# Patient Record
Sex: Male | Born: 1970 | Race: Black or African American | Hispanic: No | Marital: Single | Smoking: Former smoker
Health system: Southern US, Community
[De-identification: ages and names within clinical notes are randomized; demographics above are authoritative.]

## PROBLEM LIST (undated history)

## (undated) DIAGNOSIS — I2510 Atherosclerotic heart disease of native coronary artery without angina pectoris: Secondary | ICD-10-CM

## (undated) DIAGNOSIS — I509 Heart failure, unspecified: Secondary | ICD-10-CM

## (undated) DIAGNOSIS — I10 Essential (primary) hypertension: Secondary | ICD-10-CM

## (undated) DIAGNOSIS — J45909 Unspecified asthma, uncomplicated: Secondary | ICD-10-CM

## (undated) DIAGNOSIS — I639 Cerebral infarction, unspecified: Secondary | ICD-10-CM

## (undated) DIAGNOSIS — E785 Hyperlipidemia, unspecified: Secondary | ICD-10-CM

## (undated) DIAGNOSIS — N189 Chronic kidney disease, unspecified: Secondary | ICD-10-CM

## (undated) HISTORY — PX: CARDIAC VALVE REPLACEMENT: SHX585

## (undated) HISTORY — DX: Cerebral infarction, unspecified: I63.9

## (undated) HISTORY — DX: Heart failure, unspecified: I50.9

## (undated) HISTORY — DX: Atherosclerotic heart disease of native coronary artery without angina pectoris: I25.10

## (undated) HISTORY — DX: Unspecified asthma, uncomplicated: J45.909

## (undated) HISTORY — PX: APPENDECTOMY: SHX54

## (undated) HISTORY — PX: ANKLE ARTHODESIS W/ ARTHROSCOPY: SUR63

## (undated) HISTORY — DX: Chronic kidney disease, unspecified: N18.9

---

## 2017-07-31 HISTORY — PX: CORONARY ARTERY BYPASS GRAFT: SHX141

## 2020-12-28 ENCOUNTER — Encounter (HOSPITAL_COMMUNITY): Payer: Self-pay | Admitting: Emergency Medicine

## 2020-12-28 ENCOUNTER — Ambulatory Visit (HOSPITAL_COMMUNITY)
Admission: EM | Admit: 2020-12-28 | Discharge: 2020-12-28 | Disposition: A | Discharge status: Home / Self Care | Payer: Medicaid - Out of State

## 2020-12-28 ENCOUNTER — Ambulatory Visit (INDEPENDENT_AMBULATORY_CARE_PROVIDER_SITE_OTHER): Payer: Self-pay

## 2020-12-28 ENCOUNTER — Other Ambulatory Visit: Payer: Self-pay

## 2020-12-28 DIAGNOSIS — R0989 Other specified symptoms and signs involving the circulatory and respiratory systems: Secondary | ICD-10-CM

## 2020-12-28 DIAGNOSIS — E785 Hyperlipidemia, unspecified: Secondary | ICD-10-CM | POA: Diagnosis not present

## 2020-12-28 DIAGNOSIS — Z8679 Personal history of other diseases of the circulatory system: Secondary | ICD-10-CM

## 2020-12-28 DIAGNOSIS — I10 Essential (primary) hypertension: Secondary | ICD-10-CM | POA: Diagnosis not present

## 2020-12-28 DIAGNOSIS — R198 Other specified symptoms and signs involving the digestive system and abdomen: Secondary | ICD-10-CM | POA: Diagnosis not present

## 2020-12-28 DIAGNOSIS — Z760 Encounter for issue of repeat prescription: Secondary | ICD-10-CM | POA: Diagnosis not present

## 2020-12-28 HISTORY — DX: Essential (primary) hypertension: I10

## 2020-12-28 HISTORY — DX: Hyperlipidemia, unspecified: E78.5

## 2020-12-28 LAB — COMPREHENSIVE METABOLIC PANEL
ALT: 69 U/L — ABNORMAL HIGH (ref 0–44)
AST: 36 U/L (ref 15–41)
Albumin: 4.3 g/dL (ref 3.5–5.0)
Alkaline Phosphatase: 65 U/L (ref 38–126)
Anion gap: 11 (ref 5–15)
BUN: 23 mg/dL — ABNORMAL HIGH (ref 6–20)
CO2: 31 mmol/L (ref 22–32)
Calcium: 9.7 mg/dL (ref 8.9–10.3)
Chloride: 97 mmol/L — ABNORMAL LOW (ref 98–111)
Creatinine, Ser: 2.43 mg/dL — ABNORMAL HIGH (ref 0.61–1.24)
GFR, Estimated: 32 mL/min — ABNORMAL LOW (ref >60)
Glucose, Bld: 112 mg/dL — ABNORMAL HIGH (ref 70–99)
Potassium: 3.3 mmol/L — ABNORMAL LOW (ref 3.5–5.1)
Sodium: 139 mmol/L (ref 135–145)
Total Bilirubin: 1.1 mg/dL (ref 0.3–1.2)
Total Protein: 7.8 g/dL (ref 6.5–8.1)

## 2020-12-28 LAB — CBC WITH DIFFERENTIAL/PLATELET
Abs Immature Granulocytes: 0.01 10*3/uL (ref 0.00–0.07)
Basophils Absolute: 0.1 10*3/uL (ref 0.0–0.1)
Basophils Relative: 1 %
Eosinophils Absolute: 0.3 10*3/uL (ref 0.0–0.5)
Eosinophils Relative: 5 %
HCT: 43.6 % (ref 39.0–52.0)
Hemoglobin: 14.8 g/dL (ref 13.0–17.0)
Immature Granulocytes: 0 %
Lymphocytes Relative: 42 %
Lymphs Abs: 2.6 10*3/uL (ref 0.7–4.0)
MCH: 32.7 pg (ref 26.0–34.0)
MCHC: 33.9 g/dL (ref 30.0–36.0)
MCV: 96.2 fL (ref 80.0–100.0)
Monocytes Absolute: 0.4 10*3/uL (ref 0.1–1.0)
Monocytes Relative: 7 %
Neutro Abs: 2.8 10*3/uL (ref 1.7–7.7)
Neutrophils Relative %: 45 %
Platelets: 258 10*3/uL (ref 150–400)
RBC: 4.53 MIL/uL (ref 4.22–5.81)
RDW: 11.9 % (ref 11.5–15.5)
WBC: 6.1 10*3/uL (ref 4.0–10.5)
nRBC: 0 % (ref 0.0–0.2)

## 2020-12-28 LAB — TSH: TSH: 1.427 u[IU]/mL (ref 0.350–4.500)

## 2020-12-28 MED ORDER — ISOSORBIDE MONONITRATE ER 30 MG PO TB24: 30.0000 mg | ORAL_TABLET | Freq: Every day | ORAL | 0 refills | Status: DC

## 2020-12-28 MED ORDER — AMLODIPINE BESYLATE 5 MG PO TABS: 1.0000 | ORAL_TABLET | Freq: Every day | ORAL | 0 refills | Status: DC

## 2020-12-28 MED ORDER — LORATADINE 10 MG PO TABS: 10.0000 mg | ORAL_TABLET | Freq: Every day | ORAL | 0 refills | Status: DC

## 2020-12-28 MED ORDER — ALLOPURINOL 100 MG PO TABS: 100.0000 mg | ORAL_TABLET | Freq: Every day | ORAL | 0 refills | Status: DC

## 2020-12-28 MED ORDER — ASPIRIN 81 MG PO CHEW: 81.0000 mg | CHEWABLE_TABLET | Freq: Every day | ORAL | 0 refills | Status: DC

## 2020-12-28 MED ORDER — ATORVASTATIN CALCIUM 40 MG PO TABS: 1.0000 | ORAL_TABLET | Freq: Every day | ORAL | 0 refills | Status: DC

## 2020-12-28 MED ORDER — ENTRESTO 49-51 MG PO TABS: 1.0000 | ORAL_TABLET | Freq: Two times a day (BID) | ORAL | 0 refills | Status: DC

## 2020-12-28 MED ORDER — FUROSEMIDE 40 MG PO TABS: 40.0000 mg | ORAL_TABLET | Freq: Every day | ORAL | 0 refills | Status: DC

## 2020-12-28 MED ORDER — PREDNISONE 20 MG PO TABS: 20.0000 mg | ORAL_TABLET | Freq: Every day | ORAL | 0 refills | Status: AC

## 2020-12-28 NOTE — Discharge Instructions (Addendum)
I have called in refills of your medications.  Please take them as previously prescribed.  Someone should contact you to schedule a PCP appointment.  It is important that you follow-up with them as soon as possible for ongoing care.  There was nothing seen on your x-ray that than a little bit of soft tissue swelling.  I have called in prednisone a should not take NSAIDs including aspirin, ibuprofen/Advil, naproxen/Aleve due to risk of GI bleeding.  If this is not improved please follow-up with ENT as we discussed.

## 2020-12-28 NOTE — ED Provider Notes (Signed)
[facility]    CSN: [id number] Arrival date & time: 12/28/20  1405      History   Chief Complaint Chief Complaint  Patient presents with  . Medication Refill  . Sore Throat    HPI John Price is a 50 y.o. male.   Patient released and moved from [place] to [place].  He has a past medical history of valve replacement in 2019 and associated heart failure.  He is well controlled on his current medication regimen but is running out of his medicine and requesting refills today.  He does not have a primary care provider in this area and is interested in establishing.  He does not weigh himself daily but does monitor salt intake.  He is prescribed Lasix and takes a steady dose of this not based on daily weights.  He denies any chest pain, shortness of breath, nausea, vomiting, shortness of breath.  He reports he is doing well on these medications but is requiring refill and help establishing with primary care provider in the area.  In addition, patient reports a 1 day history of globus sensation.  Denies any unusual food intake or choking episodes.  Denies history of goiter or thyroid condition.  He denies any specific sore throat but just feels as though there is something caught in his throat.  He has been able to eat and drink normally and is taking pills without difficulty.  He has not seen ENT in the past.  Denies any shortness of breath or throat/mouth swelling.      Past Medical History:  Diagnosis Date  . Hyperlipidemia   . Hypertension     Patient Active Problem List   Diagnosis Date Noted  . History of heart failure 12/28/2020  . Essential hypertension 12/28/2020  . Hyperlipidemia 12/28/2020    Past Surgical History:  Procedure Laterality Date  . CARDIAC VALVE REPLACEMENT         Home Medications    Prior to Admission medications   Medication Sig Start Date End Date Taking? Authorizing Provider  predniSONE (DELTASONE) 20 MG tablet Take 1  tablet (20 mg total) by mouth daily with breakfast for 4 days. 12/28/20 01/01/21 Yes Mouhamad Teed, Noberto Retort, PA-C  allopurinol (ZYLOPRIM) 100 MG tablet Take 1 tablet (100 mg total) by mouth daily. 12/28/20   Shaniqua Guillot K, PA-C  amLODipine (NORVASC) 5 MG tablet Take 1 tablet (5 mg total) by mouth daily. 12/28/20   Tedd Cottrill, Noberto Retort, PA-C  aspirin 81 MG chewable tablet Chew 1 tablet (81 mg total) by mouth daily. 12/28/20   Senora Lacson, Noberto Retort, PA-C  atorvastatin (LIPITOR) 40 MG tablet Take 1 tablet (40 mg total) by mouth daily. 12/28/20   Sheng Pritz, Noberto Retort, PA-C  cloNIDine (CATAPRES) 0.2 MG tablet Take 0.2 mg by mouth 2 (two) times daily. 11/19/20   [provider]  ENTRESTO 49-51 MG Take 1 tablet by mouth 2 (two) times daily. 12/28/20   Kay Shippy, Noberto Retort, PA-C  furosemide (LASIX) 40 MG tablet Take 1 tablet (40 mg total) by mouth daily. 12/28/20   Marcena Dias, Noberto Retort, PA-C  isosorbide mononitrate (IMDUR) 30 MG 24 hr tablet Take 1 tablet (30 mg total) by mouth daily. 12/28/20   Denetra Formoso, Noberto Retort, PA-C  loratadine (CLARITIN) 10 MG tablet Take 1 tablet (10 mg total) by mouth daily. 12/28/20   Shmuel Girgis, Noberto Retort, PA-C  meclizine (ANTIVERT) 12.5 MG tablet Take 12.5 mg by mouth daily as needed. 08/05/20   [provider]  potassium chloride SA (KLOR-CON) 20 MEQ tablet Take 20 mEq by mouth daily. 11/06/20   [provider]    Family History History reviewed. No pertinent family history.  Social History Social History   Tobacco Use  . Smoking status: Never Smoker  . Smokeless tobacco: Never Used  Substance Use Topics  . Alcohol use: Yes  . Drug use: Never     Allergies   Gabapentin   Review of Systems Review of Systems  Constitutional: Negative for activity change, appetite change, fatigue and fever.  HENT: Positive for sore throat and trouble swallowing. Negative for congestion, sinus pressure and sneezing.   Respiratory: Negative for cough and shortness of breath.   Cardiovascular: Negative for chest pain,  palpitations and leg swelling.  Gastrointestinal: Negative for abdominal pain, diarrhea, nausea and vomiting.  Musculoskeletal: Negative for arthralgias and myalgias.  Neurological: Negative for dizziness, light-headedness and headaches.     Physical Exam Triage Vital Signs ED Triage Vitals  Enc Vitals Group     BP 12/28/20 1613 (!) 142/83     Pulse Rate 12/28/20 1613 (!) 54     Resp 12/28/20 1613 17     Temp 12/28/20 1613 98.7 F (37.1 C)     Temp Source 12/28/20 1613 Oral     SpO2 12/28/20 1613 98 %     Weight --      Height --      Head Circumference --      Peak Flow --      Pain Score 12/28/20 1603 0     Pain Loc --      Pain Edu? --      Excl. in GC? --    No data found.  Updated Vital Signs BP (!) 142/83 (BP Location: Right Arm)   Pulse (!) 54   Temp 98.7 F (37.1 C) (Oral)   Resp 17   SpO2 98%   Visual Acuity Right Eye Distance:   Left Eye Distance:   Bilateral Distance:    Right Eye Near:   Left Eye Near:    Bilateral Near:     Physical Exam Vitals reviewed.  Constitutional:      General: He is awake.     Appearance: Normal appearance. He is normal weight. He is not ill-appearing.     Comments: Very pleasant male appears stated age in no acute distress  HENT:     Head: Normocephalic and atraumatic.     Right Ear: Tympanic membrane, ear canal and external ear normal. Tympanic membrane is not erythematous or bulging.     Left Ear: Tympanic membrane, ear canal and external ear normal. Tympanic membrane is not erythematous or bulging.     Nose: Nose normal.     Mouth/Throat:     Pharynx: Uvula midline. No oropharyngeal exudate or posterior oropharyngeal erythema.  Neck:     Thyroid: No thyroid mass, thyromegaly or thyroid tenderness.  Cardiovascular:     Rate and Rhythm: Normal rate and regular rhythm.     Heart sounds: Normal heart sounds. No murmur heard.   Pulmonary:     Effort: Pulmonary effort is normal. No accessory muscle usage or  respiratory distress.     Breath sounds: Normal breath sounds. No stridor. No wheezing, rhonchi or rales.     Comments: Clear to auscultation bilaterally Abdominal:     General: Bowel sounds are normal.     Palpations: Abdomen is soft.     Tenderness: There is no  abdominal tenderness.  Musculoskeletal:     Cervical back: Normal range of motion and neck supple.  Lymphadenopathy:     Head:     Right side of head: No submental, submandibular or tonsillar adenopathy.     Left side of head: No submental, submandibular or tonsillar adenopathy.     Cervical: No cervical adenopathy.  Neurological:     Mental Status: He is alert.  Psychiatric:        Behavior: Behavior is cooperative.      UC Treatments / Results  Labs (all labs ordered are listed, but only abnormal results are displayed) Labs Reviewed  CBC WITH DIFFERENTIAL/PLATELET  COMPREHENSIVE METABOLIC PANEL  TSH    EKG   Radiology DG Neck Soft Tissue  Result Date: 12/28/2020 CLINICAL DATA:  Globus sensation EXAM: NECK SOFT TISSUES - 1+ VIEW COMPARISON:  None. FINDINGS: Posterior oropharynx appears collapsed and is non air-filled. Trachea is midline and normally aerated. Diffuse prevertebral soft tissue swelling is present throughout the cervical spine which may be due to expiration and diffuse fat deposition. There is calcification of the carotid artery bilaterally. Normal vertebral alignment. Mild disc degeneration and anterior spurring C3 through C7. Sternal wires for median sternotomy. IMPRESSION: Posterior oropharynx is non air-filled likely due to collapsed pharynx. Possible expiratory study. Trachea is normally aerated. Limited evaluation of the epiglottis. Diffuse prevertebral soft tissue swelling is nonfocal and may be due to body habitus Electronically Signed   By: Marlan Palau M.D.   On: 12/28/2020 18:19    Procedures Procedures (including critical care time)  Medications Ordered in UC Medications - No data to  display  Initial Impression / Assessment and Plan / UC Course  I have reviewed the triage vital signs and the nursing notes.  Pertinent labs & imaging results that were available during my care of the patient were reviewed by me and considered in my medical decision making (see chart for details).     Refills provided of medication as requested patient.  Encouraged him to continue with daily weights and avoid sodium.  Discussed the importance of establishing with PCP.  Patient does not have a PCP in this area and so we will try to establish him with PCP assistance.  CBC and CMP obtained today-results pending.  Discussed alarm symptoms are warrant emergent evaluation.  Strict return precautions given to patient expressed understanding.  X-ray obtained of soft tissue stated possible edema otherwise related to body habitus.  He is EKG today-results pending.  We will start low-dose prednisone to help manage inflammation.  Patient was given contact information for ENT with instruction to call them should symptoms persist.  Discussed alarm symptoms that warrant emergent evaluation.  Strict return precautions given to which patient expressed understanding   Final Clinical Impressions(s) / UC Diagnoses   Final diagnoses:  History of heart failure  Essential hypertension  Hyperlipidemia, unspecified hyperlipidemia type  Encounter for medication refill  Globus sensation     Discharge Instructions     I have called in refills of your medications.  Please take them as previously prescribed.  Someone should contact you to schedule a PCP appointment.  It is important that you follow-up with them as soon as possible for ongoing care.    ED Prescriptions    Medication Sig Dispense Auth. Provider   allopurinol (ZYLOPRIM) 100 MG tablet Take 1 tablet (100 mg total) by mouth daily. 30 tablet Kaydin Karbowski K, PA-C   amLODipine (NORVASC) 5 MG tablet Take 1  tablet (5 mg total) by mouth daily. 30 tablet  Harwood Nall K, PA-C   atorvastatin (LIPITOR) 40 MG tablet Take 1 tablet (40 mg total) by mouth daily. 30 tablet Nakyah Erdmann K, PA-C   ENTRESTO 49-51 MG Take 1 tablet by mouth 2 (two) times daily. 60 tablet Ranveer Wahlstrom K, PA-C   aspirin 81 MG chewable tablet Chew 1 tablet (81 mg total) by mouth daily. 30 tablet Rawley Harju K, PA-C   furosemide (LASIX) 40 MG tablet Take 1 tablet (40 mg total) by mouth daily. 30 tablet Jomarion Mish K, PA-C   isosorbide mononitrate (IMDUR) 30 MG 24 hr tablet Take 1 tablet (30 mg total) by mouth daily. 30 tablet Dee Paden K, PA-C   loratadine (CLARITIN) 10 MG tablet Take 1 tablet (10 mg total) by mouth daily. 30 tablet Eric Nees K, PA-C   predniSONE (DELTASONE) 20 MG tablet Take 1 tablet (20 mg total) by mouth daily with breakfast for 4 days. 4 tablet Tania Steinhauser, Noberto Retort, PA-C     PDMP not reviewed this encounter.   Jeani Hawking, PA-C 12/28/20 1836

## 2020-12-28 NOTE — ED Triage Notes (Signed)
Pt presents with feeling of "something stuck in throat" that started this pm and medication refill of all medications listed in chart. States [place] and does not have pcp.

## 2021-01-02 ENCOUNTER — Encounter: Payer: Self-pay | Admitting: *Deleted

## 2021-01-03 ENCOUNTER — Other Ambulatory Visit: Payer: Self-pay

## 2021-01-03 ENCOUNTER — Emergency Department (HOSPITAL_COMMUNITY)
Admission: EM | Admit: 2021-01-03 | Discharge: 2021-01-04 | Disposition: A | Discharge status: Home / Self Care | Payer: Medicaid Other | Attending: Emergency Medicine | Admitting: Emergency Medicine

## 2021-01-03 ENCOUNTER — Telehealth (HOSPITAL_COMMUNITY): Payer: Self-pay | Admitting: Emergency Medicine

## 2021-01-03 ENCOUNTER — Encounter (HOSPITAL_COMMUNITY): Payer: Self-pay | Admitting: Emergency Medicine

## 2021-01-03 DIAGNOSIS — R42 Dizziness and giddiness: Secondary | ICD-10-CM | POA: Diagnosis not present

## 2021-01-03 DIAGNOSIS — Z79899 Other long term (current) drug therapy: Secondary | ICD-10-CM | POA: Insufficient documentation

## 2021-01-03 DIAGNOSIS — R010 Benign and innocent cardiac murmurs: Secondary | ICD-10-CM | POA: Diagnosis not present

## 2021-01-03 DIAGNOSIS — I110 Hypertensive heart disease with heart failure: Secondary | ICD-10-CM | POA: Insufficient documentation

## 2021-01-03 DIAGNOSIS — Z7982 Long term (current) use of aspirin: Secondary | ICD-10-CM | POA: Diagnosis not present

## 2021-01-03 DIAGNOSIS — F419 Anxiety disorder, unspecified: Secondary | ICD-10-CM | POA: Diagnosis not present

## 2021-01-03 DIAGNOSIS — I509 Heart failure, unspecified: Secondary | ICD-10-CM | POA: Diagnosis not present

## 2021-01-03 MED ORDER — ALLOPURINOL 100 MG PO TABS: 100.0000 mg | ORAL_TABLET | Freq: Every day | ORAL | 0 refills | Status: DC

## 2021-01-03 MED ORDER — ENTRESTO 49-51 MG PO TABS: 1.0000 | ORAL_TABLET | Freq: Two times a day (BID) | ORAL | 0 refills | Status: DC

## 2021-01-03 MED ORDER — ATORVASTATIN CALCIUM 40 MG PO TABS: 1.0000 | ORAL_TABLET | Freq: Every day | ORAL | 0 refills | Status: DC

## 2021-01-03 MED ORDER — LORATADINE 10 MG PO TABS: 10.0000 mg | ORAL_TABLET | Freq: Every day | ORAL | 0 refills | Status: DC

## 2021-01-03 MED ORDER — ISOSORBIDE MONONITRATE ER 30 MG PO TB24: 30.0000 mg | ORAL_TABLET | Freq: Every day | ORAL | 0 refills | Status: DC

## 2021-01-03 MED ORDER — AMLODIPINE BESYLATE 5 MG PO TABS: 1.0000 | ORAL_TABLET | Freq: Every day | ORAL | 0 refills | Status: DC

## 2021-01-03 MED ORDER — ASPIRIN 81 MG PO CHEW: 81.0000 mg | CHEWABLE_TABLET | Freq: Every day | ORAL | 0 refills | Status: DC

## 2021-01-03 MED ORDER — FUROSEMIDE 40 MG PO TABS: 40.0000 mg | ORAL_TABLET | Freq: Every day | ORAL | 0 refills | Status: DC

## 2021-01-03 NOTE — ED Provider Notes (Signed)
Emergency Medicine Provider Triage Evaluation Note  John Price , a 50 y.o. male  was evaluated in triage.  Pt complains of dizziness.  The patient states that he has been feeling intermittently dizzy for the last few days.  He has a history of similar.  He states that this has previously happened when he has run out of his home medications.  He reports that he just moved to the area and does not have any of his home medications and use them all refilled.  He reports intermittent headaches.  No numbness, weakness, vomiting, leg swelling, chest pain, shortness of breath  Per chart review, the patient was seen at urgent care on May 31.  His home medications were called in to the pharmacy.  However, the patient reports that he became increasingly anxious about taking of his medications so he has not picked them up yet.  Review of Systems  Positive: Anxiety Negative: Numbness, weakness, vomiting, leg swelling, visual changes, chest pain, shortness of breath  Physical Exam  BP (!) 153/97 (BP Location: Left Arm)   Pulse 60   Temp 98.2 F (36.8 C) (Oral)   Resp (!) 22   Ht 5\' 8"  (1.727 m)   Wt 120 kg   SpO2 100%   BMI 40.22 kg/m  Gen:   Awake, no distress   Resp:  Normal effort  MSK:   Moves extremities without difficulty  Other:  Answers questions in complete, fluent sentences.  No peripheral edema.   Medical Decision Making  Medically screening exam initiated at 11:38 PM.  Appropriate orders placed.  was informed that the remainder of the evaluation will be completed by another provider, this initial triage assessment does not replace that evaluation, and the importance of remaining in the ED until their evaluation is complete.  50 year old male who presents with dizziness.  Has a history of similar and states that this is most likely related to being out of his home medications.  I did review his labs from urgent care on May 31.  He does not appear overtly volume  overloaded.  He will require further work-up and evaluation in the emergency department.   June 02, PA-C 01/03/21 2338    2339, MD 01/04/21 [phone]

## 2021-01-03 NOTE — ED Triage Notes (Signed)
Patient reports intermittent vertigo/dizzy this week , requesting medications/prescriptions for his vertigo .

## 2021-01-03 NOTE — Telephone Encounter (Signed)
Patient wanted resent to a different pharmacy that would accept his insurance and has all meds available

## 2021-01-04 LAB — CBC
HCT: 41.9 % (ref 39.0–52.0)
Hemoglobin: 14.5 g/dL (ref 13.0–17.0)
MCH: 33.3 pg (ref 26.0–34.0)
MCHC: 34.6 g/dL (ref 30.0–36.0)
MCV: 96.3 fL (ref 80.0–100.0)
Platelets: 274 10*3/uL (ref 150–400)
RBC: 4.35 MIL/uL (ref 4.22–5.81)
RDW: 11.9 % (ref 11.5–15.5)
WBC: 8 10*3/uL (ref 4.0–10.5)
nRBC: 0 % (ref 0.0–0.2)

## 2021-01-04 LAB — BASIC METABOLIC PANEL
Anion gap: 10 (ref 5–15)
BUN: 36 mg/dL — ABNORMAL HIGH (ref 6–20)
CO2: 31 mmol/L (ref 22–32)
Calcium: 9.5 mg/dL (ref 8.9–10.3)
Chloride: 97 mmol/L — ABNORMAL LOW (ref 98–111)
Creatinine, Ser: 2.48 mg/dL — ABNORMAL HIGH (ref 0.61–1.24)
GFR, Estimated: 31 mL/min — ABNORMAL LOW (ref >60)
Glucose, Bld: 120 mg/dL — ABNORMAL HIGH (ref 70–99)
Potassium: 3.2 mmol/L — ABNORMAL LOW (ref 3.5–5.1)
Sodium: 138 mmol/L (ref 135–145)

## 2021-01-04 MED ORDER — MECLIZINE HCL 12.5 MG PO TABS: 12.5000 mg | ORAL_TABLET | Freq: Three times a day (TID) | ORAL | 0 refills | Status: DC | PRN

## 2021-01-04 MED ORDER — MECLIZINE HCL 25 MG PO TABS
25.0000 mg | ORAL_TABLET | Freq: Once | ORAL | Status: AC
  Administered 2021-01-04: 25 mg via ORAL
  Filled 2021-01-04: qty 1

## 2021-01-04 NOTE — Discharge Instructions (Addendum)
You were seen today for dizziness and anxiety.  Make sure to pick up your prescriptions at the pharmacy.

## 2021-01-04 NOTE — ED Provider Notes (Signed)
[facility] EMERGENCY DEPARTMENT Provider Note   CSN: [id number] Arrival date & time: 01/03/21  2041     History Chief Complaint  Patient presents with  . Dizziness    Vertigo     John Price is a 50 y.o. male.  HPI     This a 50 year old male with a history of hypertension, hyperlipidemia, heart failure, reported stroke, chronic kidney disease who presents with dizziness and anxiety.  Patient reports that he just moved to [place] and now lives with his mother.  He does not have a primary physician.  He is interested in establishing care.  He states that he has been intermittently dizzy.  He has a history of vertigo for which he takes medications.  He is not having any headache.  He is currently not dizzy.  He has been unable to pick up his medications from the pharmacy.  He denies any new weakness, numbness, tingling, chest pain, shortness of breath, abdominal pain, fevers.  Patient reports he has anxiety of "what comes next."  Denies SI or HI.  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  Past Medical History:  Diagnosis Date  . Hyperlipidemia   . Hypertension     Patient Active Problem List   Diagnosis Date Noted  . History of heart failure 12/28/2020  . Essential hypertension 12/28/2020  . Hyperlipidemia 12/28/2020    Past Surgical History:  Procedure Laterality Date  . CARDIAC VALVE REPLACEMENT         No family history on file.  Social History   Tobacco Use  . Smoking status: Never Smoker  . Smokeless tobacco: Never Used  Substance Use Topics  . Alcohol use: Yes  . Drug use: Never    Home Medications Prior to Admission medications   Medication Sig Start Date End Date Taking? Authorizing Provider  meclizine (ANTIVERT) 12.5 MG tablet Take 1 tablet (12.5 mg total) by mouth 3 (three) times daily as needed for dizziness. 01/04/21  Yes Bell Cai, Mayer Masker, MD  allopurinol  (ZYLOPRIM) 100 MG tablet Take 1 tablet (100 mg total) by mouth daily. 01/03/21   Merrilee Jansky, MD  amLODipine (NORVASC) 5 MG tablet Take 1 tablet (5 mg total) by mouth daily. 01/03/21   Merrilee Jansky, MD  aspirin 81 MG chewable tablet Chew 1 tablet (81 mg total) by mouth daily. 01/03/21   Merrilee Jansky, MD  atorvastatin (LIPITOR) 40 MG tablet Take 1 tablet (40 mg total) by mouth daily. 01/03/21   LampteyBritta Mccreedy, MD  cloNIDine (CATAPRES) 0.2 MG tablet Take 0.2 mg by mouth 2 (two) times daily. 11/19/20   [provider]  ENTRESTO 49-51 MG Take 1 tablet by mouth 2 (two) times daily. 01/03/21   LampteyBritta Mccreedy, MD  furosemide (LASIX) 40 MG tablet Take 1 tablet (40 mg total) by mouth daily. 01/03/21   Merrilee Jansky, MD  isosorbide mononitrate (IMDUR) 30 MG 24 hr tablet Take 1 tablet (30 mg total) by mouth daily. 01/03/21   Merrilee Jansky, MD  loratadine (CLARITIN) 10 MG tablet Take 1 tablet (10 mg total) by mouth daily. 01/03/21   Merrilee Jansky, MD  meclizine (ANTIVERT) 12.5 MG tablet Take 12.5 mg by mouth daily as needed. 08/05/20   [provider]  potassium chloride SA (KLOR-CON) 20 MEQ tablet Take 20 mEq by mouth daily. 11/06/20   [provider]  Allergies    Gabapentin  Review of Systems   Review of Systems  Constitutional: Negative for fever.  Respiratory: Negative for shortness of breath.   Cardiovascular: Negative for chest pain.  Gastrointestinal: Negative for abdominal pain, nausea and vomiting.  Musculoskeletal: Negative for back pain.  Neurological: Positive for dizziness. Negative for light-headedness and headaches.  Psychiatric/Behavioral: The patient is nervous/anxious.   All other systems reviewed and are negative.   Physical Exam Updated Vital Signs BP 134/79   Pulse (!) 50   Temp 97.6 F (36.4 C) (Oral)   Resp (!) 21   Ht 1.727 m (5\' 8" )   Wt 120 kg   SpO2 96%   BMI 40.22 kg/m   Physical Exam Vitals and nursing note  reviewed.  Constitutional:      Appearance: He is well-developed. He is obese.  HENT:     Head: Normocephalic and atraumatic.     Nose: Nose normal.     Mouth/Throat:     Mouth: Mucous membranes are moist.  Eyes:     Extraocular Movements: Extraocular movements intact.     Pupils: Pupils are equal, round, and reactive to light.     Comments: No nystagmus  Cardiovascular:     Rate and Rhythm: Normal rate and regular rhythm.     Heart sounds: Murmur heard.    Pulmonary:     Effort: Pulmonary effort is normal. No respiratory distress.     Breath sounds: Normal breath sounds. No wheezing.  Abdominal:     General: Bowel sounds are normal.     Palpations: Abdomen is soft.     Tenderness: There is no abdominal tenderness. There is no rebound.  Musculoskeletal:     Cervical back: Neck supple.     Right lower leg: No edema.     Left lower leg: No edema.  Lymphadenopathy:     Cervical: No cervical adenopathy.  Skin:    General: Skin is warm and dry.  Neurological:     Mental Status: He is alert and oriented to person, place, and time.     Comments: Cranial nerves II through XII intact, 5 out of 5 strength in all 4 extremities, no dysmetria to finger-nose-finger  Psychiatric:        Mood and Affect: Mood normal.     ED Results / Procedures / Treatments   Labs (all labs ordered are listed, but only abnormal results are displayed) Labs Reviewed  BASIC METABOLIC PANEL - Abnormal; Notable for the following components:      Result Value   Potassium 3.2 (*)    Chloride 97 (*)    Glucose, Bld 120 (*)    BUN 36 (*)    Creatinine, Ser 2.48 (*)    GFR, Estimated 31 (*)    All other components within normal limits  CBC    EKG None  Radiology No results found.  Procedures Procedures   Medications Ordered in ED Medications  meclizine (ANTIVERT) tablet 25 mg (25 mg Oral Given 01/04/21 0511)    ED Course  I have reviewed the triage vital signs and the nursing  notes.  Pertinent labs & imaging results that were available during my care of the patient were reviewed by me and considered in my medical decision making (see chart for details).    MDM Rules/Calculators/A&P                          Patient presents with  complaints of intermittent dizziness.  He is not currently dizzy.  He has no focal signs or symptoms.  No evidence of dysmetria.  History of vertigo responsive to meclizine in the past.  He was given a dose of meclizine.  Doubt central vertigo.  Labs reviewed from triage.  No leukocytosis.  Creatinine 2.48 which appears to be close to baseline.  Patient also endorses anxiety around his recent move and not having follow-up with a primary physician.  He also has not yet picked up his additional medications.  Patient was given a dose of meclizine.  Do not feel he needs imaging or further work-up at this time.  Was given follow-up information for [facility].  After history, exam, and medical workup I feel the patient has been appropriately medically screened and is safe for discharge home. Pertinent diagnoses were discussed with the patient. Patient was given return precautions.  Final Clinical Impression(s) / ED Diagnoses Final diagnoses:  Vertigo  Anxiety    Rx / DC Orders ED Discharge Orders         Ordered    meclizine (ANTIVERT) 12.5 MG tablet  3 times daily PRN        01/04/21 0554           Shon Baton, MD 01/04/21 [phone]

## 2021-02-03 ENCOUNTER — Encounter (HOSPITAL_COMMUNITY): Payer: Self-pay | Admitting: Emergency Medicine

## 2021-02-03 ENCOUNTER — Emergency Department (HOSPITAL_COMMUNITY)
Admission: EM | Admit: 2021-02-03 | Discharge: 2021-02-03 | Disposition: A | Discharge status: Home / Self Care | Payer: Medicaid Other | Attending: Emergency Medicine | Admitting: Emergency Medicine

## 2021-02-03 DIAGNOSIS — I110 Hypertensive heart disease with heart failure: Secondary | ICD-10-CM | POA: Diagnosis not present

## 2021-02-03 DIAGNOSIS — Z79899 Other long term (current) drug therapy: Secondary | ICD-10-CM | POA: Insufficient documentation

## 2021-02-03 DIAGNOSIS — I509 Heart failure, unspecified: Secondary | ICD-10-CM | POA: Diagnosis not present

## 2021-02-03 DIAGNOSIS — Z7982 Long term (current) use of aspirin: Secondary | ICD-10-CM | POA: Insufficient documentation

## 2021-02-03 DIAGNOSIS — Z760 Encounter for issue of repeat prescription: Secondary | ICD-10-CM

## 2021-02-03 MED ORDER — CLONIDINE HCL 0.2 MG PO TABS: 0.2000 mg | ORAL_TABLET | Freq: Two times a day (BID) | ORAL | 0 refills | Status: DC

## 2021-02-03 MED ORDER — ALLOPURINOL 100 MG PO TABS: 100.0000 mg | ORAL_TABLET | Freq: Every day | ORAL | 0 refills | Status: DC

## 2021-02-03 MED ORDER — AMLODIPINE BESYLATE 5 MG PO TABS: 5.0000 mg | ORAL_TABLET | Freq: Every day | ORAL | 0 refills | Status: DC

## 2021-02-03 MED ORDER — FUROSEMIDE 40 MG PO TABS: 40.0000 mg | ORAL_TABLET | Freq: Every day | ORAL | 0 refills | Status: DC

## 2021-02-03 MED ORDER — POTASSIUM CHLORIDE CRYS ER 20 MEQ PO TBCR: 20.0000 meq | EXTENDED_RELEASE_TABLET | Freq: Every day | ORAL | 0 refills | Status: DC

## 2021-02-03 MED ORDER — ATORVASTATIN CALCIUM 40 MG PO TABS: 40.0000 mg | ORAL_TABLET | Freq: Every day | ORAL | 0 refills | Status: DC

## 2021-02-03 MED ORDER — ENTRESTO 49-51 MG PO TABS: 1.0000 | ORAL_TABLET | Freq: Two times a day (BID) | ORAL | 0 refills | Status: DC

## 2021-02-03 MED ORDER — MECLIZINE HCL 12.5 MG PO TABS: 12.5000 mg | ORAL_TABLET | Freq: Every day | ORAL | 0 refills | Status: DC | PRN

## 2021-02-03 MED ORDER — LORATADINE 10 MG PO TABS: 10.0000 mg | ORAL_TABLET | Freq: Every day | ORAL | 0 refills | Status: DC

## 2021-02-03 MED ORDER — CARVEDILOL 25 MG PO TABS: 25.0000 mg | ORAL_TABLET | Freq: Two times a day (BID) | ORAL | 0 refills | Status: DC

## 2021-02-03 NOTE — ED Provider Notes (Signed)
[facility] EMERGENCY DEPARTMENT Provider Note   CSN: [id number] Arrival date & time: 02/03/21  1041     History Chief Complaint  Patient presents with   Medication Refill    John Price is a 50 y.o. male.  HPI   Every patient is a 50 year old male new to the area requesting medication refills.  No acute complaints.  Past Medical History:  Diagnosis Date   Hyperlipidemia    Hypertension     Patient Active Problem List   Diagnosis Date Noted   History of heart failure 12/28/2020   Essential hypertension 12/28/2020   Hyperlipidemia 12/28/2020    Past Surgical History:  Procedure Laterality Date   CARDIAC VALVE REPLACEMENT         No family history on file.  Social History   Tobacco Use   Smoking status: Never   Smokeless tobacco: Never  Substance Use Topics   Alcohol use: Yes   Drug use: Never    Home Medications Prior to Admission medications   Medication Sig Start Date End Date Taking? Authorizing Provider  allopurinol (ZYLOPRIM) 100 MG tablet Take 1 tablet (100 mg total) by mouth daily. 01/03/21   Merrilee Jansky, MD  amLODipine (NORVASC) 5 MG tablet Take 1 tablet (5 mg total) by mouth daily. 01/03/21   Merrilee Jansky, MD  aspirin 81 MG chewable tablet Chew 1 tablet (81 mg total) by mouth daily. 01/03/21   Merrilee Jansky, MD  atorvastatin (LIPITOR) 40 MG tablet Take 1 tablet (40 mg total) by mouth daily. 01/03/21   LampteyBritta Mccreedy, MD  cloNIDine (CATAPRES) 0.2 MG tablet Take 0.2 mg by mouth 2 (two) times daily. 11/19/20   [provider]  ENTRESTO 49-51 MG Take 1 tablet by mouth 2 (two) times daily. 01/03/21   LampteyBritta Mccreedy, MD  furosemide (LASIX) 40 MG tablet Take 1 tablet (40 mg total) by mouth daily. 01/03/21   Merrilee Jansky, MD  isosorbide mononitrate (IMDUR) 30 MG 24 hr tablet Take 1 tablet (30 mg total) by mouth daily. 01/03/21   Merrilee Jansky, MD  loratadine (CLARITIN) 10 MG tablet Take 1 tablet (10 mg  total) by mouth daily. 01/03/21   Merrilee Jansky, MD  meclizine (ANTIVERT) 12.5 MG tablet Take 12.5 mg by mouth daily as needed. 08/05/20   [provider]  meclizine (ANTIVERT) 12.5 MG tablet Take 1 tablet (12.5 mg total) by mouth 3 (three) times daily as needed for dizziness. 01/04/21   Horton, Mayer Masker, MD  potassium chloride SA (KLOR-CON) 20 MEQ tablet Take 20 mEq by mouth daily. 11/06/20   [provider]    Allergies    Gabapentin  Review of Systems   Review of Systems  All other systems reviewed and are negative.  Physical Exam Updated Vital Signs BP (!) 146/81   Pulse 85   Temp 98.2 F (36.8 C) (Oral)   Resp 16   SpO2 97%   Physical Exam Vitals and nursing note reviewed. Exam conducted with a chaperone present.  Constitutional:      General: He is not in acute distress.    Appearance: Normal appearance.  HENT:     Head: Normocephalic and atraumatic.  Eyes:     General: No scleral icterus.    Extraocular Movements: Extraocular movements intact.     Pupils: Pupils are equal, round, and reactive to light.  Skin:    Coloration: Skin is not jaundiced.  Neurological:  Mental Status: He is alert. Mental status is at baseline.     Coordination: Coordination normal.    ED Results / Procedures / Treatments   Labs (all labs ordered are listed, but only abnormal results are displayed) Labs Reviewed - No data to display  EKG None  Radiology No results found.  Procedures Procedures   Medications Ordered in ED Medications - No data to display  ED Course  I have reviewed the triage vital signs and the nursing notes.  Pertinent labs & imaging results that were available during my care of the patient were reviewed by me and considered in my medical decision making (see chart for details).    MDM Rules/Calculators/A&P                          No acute complaints.  Vitals are stable although he is moderately hypertensive.  Given him a referral  to establish with a PCP here.  States she already has a PCP, but does not have an appointment yet.  I given him 1 month refills of home meds.  Patient is appropriate for discharge.  Final Clinical Impression(s) / ED Diagnoses Final diagnoses:  None    Rx / DC Orders ED Discharge Orders     None        Theron Arista, [place] 02/03/21 1147    Mancel Bale, MD 02/05/21 [id number]

## 2021-02-03 NOTE — ED Triage Notes (Signed)
Patient here requesting refill of several medications. States he recently moved to the area and has not seen his new primary care provider yet. Patient alert, oriented, and in no apparent distress at this time.

## 2021-02-03 NOTE — Discharge Instructions (Signed)
You were seen today for medication refill.  Please establish with a PCP in the area.

## 2021-02-25 NOTE — Progress Notes (Signed)
Date:  02/28/2021   ID:  JACORIE ERNSBERGER, DOB Mar 20, 1971, MRN [id number]  PCP:  Carylon Perches, NP  Cardiologist:  Rex Kras, DO, [facility]  (established care 02/28/2021) Former Cardiology Providers: Lynne Leader [facility])  REASON FOR CONSULT: Complex past cardiac history (CABG and mitral valve replacement) reestablish care.  REQUESTING PHYSICIAN:  Self-referral.  Chief Complaint  Patient presents with   New Patient (Initial Visit)    HPI  JATINDER MCDONAGH is a 50 y.o. 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  He is referred to the office at the request of No ref. provider found for evaluation of reestablish care and complex cardiac history.  Patient states that he recently relocated from [place] to [facility] and is referred by his insurance company to establish care with a cardiologist locally.  He was under the care of Dr. Lynne Leader at [place] last office note dated 11/05/2020.  He comes in the office today to have his medications refilled as he is nearly running out.  He recently went to the ED approximately 1 month ago for the same reason.  Clinically patient denies any chest pain at rest or with effort related activities.  He does have mild discomfort along the sternotomy site which is chronic and stable.  Patient is overall euvolemic and not in congestive heart failure but does carry history of HFrEF.  Patient states that he has chronic shortness of breath  secondary to underlying asthma.  Based on the very limited amount of records received from his prior cardiologist office it appears he had hemorrhagic stroke in 2018 with left-sided residual deficits due to uncontrolled hypertension.  The following year in 2019 he underwent coronary artery bypass grafting surgery with mitral valve replacement.  Patient does not recall how many vessels were bypassed or his coronary anatomy.  And based on the records obtained he had a bioprosthetic mitral valve.  FUNCTIONAL STATUS: No structured exercise program or daily routine.   ALLERGIES: Allergies  Allergen Reactions   Gabapentin Anaphylaxis    MEDICATION LIST PRIOR TO VISIT: Current Meds  Medication Sig   albuterol (PROVENTIL HFA) 108 (90 Base) MCG/ACT inhaler Inhale into the lungs every 6 (six) hours as needed for wheezing or shortness of breath.   allopurinol (ZYLOPRIM) 100 MG tablet Take 1 tablet (100 mg total) by mouth daily.   ipratropium (ATROVENT HFA) 17 MCG/ACT inhaler Inhale 2 puffs into the lungs every 6 (six) hours.   loratadine (CLARITIN) 10 MG tablet Take 1 tablet (10 mg total) by mouth daily.   meclizine (ANTIVERT) 12.5 MG tablet Take 1 tablet (12.5 mg total) by mouth daily as needed.   [DISCONTINUED] amLODipine (NORVASC) 5 MG tablet Take 1 tablet (5 mg total) by mouth daily.   [DISCONTINUED] aspirin 81 MG chewable tablet Chew 1 tablet (81 mg total) by mouth daily.   [DISCONTINUED] atorvastatin (LIPITOR) 40 MG tablet Take 1 tablet (40 mg total) by mouth daily.   [DISCONTINUED] carvedilol (COREG) 25 MG tablet Take 1 tablet (25 mg total) by mouth 2 (two) times daily with a meal.   [  DISCONTINUED] cloNIDine (CATAPRES) 0.2 MG tablet Take 1 tablet (0.2 mg total) by mouth 2 (two) times daily. (Patient taking differently: Take 0.2 mg by mouth daily.)   [DISCONTINUED] ENTRESTO 49-51 MG Take 1 tablet by mouth 2 (two) times daily.   [DISCONTINUED] furosemide (LASIX) 40 MG tablet Take 1 tablet (40 mg  total) by mouth daily.   [DISCONTINUED] isosorbide mononitrate (IMDUR) 30 MG 24 hr tablet Take 1 tablet (30 mg total) by mouth daily. (Patient taking differently: Take 30 mg by mouth in the morning and at bedtime.)   [DISCONTINUED] potassium chloride SA (KLOR-CON) 20 MEQ tablet Take 1 tablet (20 mEq total) by mouth daily.     PAST MEDICAL HISTORY: Past Medical History:  Diagnosis Date   Asthma    CHF (congestive heart failure) ([place])    Chronic kidney disease    Coronary artery disease    Hyperlipidemia    Hypertension    Stroke ([place])     PAST SURGICAL HISTORY: Past Surgical History:  Procedure Laterality Date   ANKLE ARTHODESIS W/ ARTHROSCOPY Left    APPENDECTOMY     CARDIAC VALVE REPLACEMENT     CORONARY ARTERY BYPASS GRAFT  2019    FAMILY HISTORY: The patient family history includes Epilepsy in his sister; Hypertension in his mother.  SOCIAL HISTORY:  The patient  reports that he has quit smoking. His smoking use included cigarettes. He has never used smokeless tobacco. He reports current alcohol use. He reports that he does not use drugs.  REVIEW OF SYSTEMS: Review of Systems  Constitutional: Negative for chills and fever.  HENT:  Negative for hoarse voice and nosebleeds.   Eyes:  Negative for discharge, double vision and pain.  Cardiovascular:  Positive for chest pain (msk - at the sternotomy site.). Negative for claudication, dyspnea on exertion, leg swelling, near-syncope, orthopnea, palpitations, paroxysmal nocturnal dyspnea and syncope.  Respiratory:  Positive for shortness of breath (chronic). Negative for hemoptysis.   Musculoskeletal:  Negative for muscle cramps and myalgias.  Gastrointestinal:  Negative for abdominal pain, constipation, diarrhea, hematemesis, hematochezia, melena, nausea and vomiting.  Neurological:  Positive for light-headedness. Negative for dizziness.   PHYSICAL EXAM: Vitals with BMI 02/28/2021 02/03/2021 01/04/2021  Height _0  - -  Weight 271  lbs - -  BMI 44.96 - -  Systolic 759 163 846  Diastolic 76 81 72  Pulse 69 85 67   Orthostatic VS for the past 72 hrs (Last 3 readings):  Orthostatic BP Patient Position BP Location Cuff Size Orthostatic Pulse  02/28/21 1211 123/45 Standing Left Arm Large --  02/28/21 1210 (!) 143/92 Supine Left Arm Large 50  02/28/21 1209 148/87 Supine Left Arm Large 71  02/28/21 1151 -- Sitting Left Arm Large --   CONSTITUTIONAL: Appears older than stated age, hemodynamically stable, no acute distress, ambulates with a cane.  SKIN: Skin is warm and dry. No rash noted. No cyanosis. No pallor. No jaundice HEAD: Normocephalic and atraumatic.  EYES: No scleral icterus.  Arcus senilis MOUTH/THROAT: Moist oral membranes.  NECK: No JVD present. No thyromegaly noted. LYMPHATIC: No visible cervical adenopathy.  CHEST Normal respiratory effort. No intercostal retractions.  Sternotomy site is clean dry and intact LUNGS: Clear to auscultation bilaterally.  No stridor. No wheezes. No rales.  CARDIOVASCULAR: Regular rate and rhythm, positive K5-L9, soft holosystolic murmur heard at the apex, no gallops or rubs. ABDOMINAL: Obese, soft, nontender, nondistended, positive bowel sounds in all 4 quadrants, no apparent ascites.  EXTREMITIES: No peripheral edema,  2+ posterior tibial pulses bilaterally. HEMATOLOGIC: No significant bruising NEUROLOGIC: Oriented to person, place, and time.  5+ right upper and lower extremity strength, 3+ left upper and lower extremity strength. Normal muscle tone.  PSYCHIATRIC: Normal mood and affect. Normal behavior. Cooperative  CARDIAC DATABASE: EKG: 02/28/2021: Normal sinus rhythm, 72 bpm, left axis, left anterior fascicular block, poor R wave progression, nonspecific ST-T changes in the high lateral leads cannot rule out ischemia.  Without underlying injury pattern.  No prior ECGs available for comparison.  Echocardiogram: 11/2019 [facility]: LVEF 35%, moderate/severe  concentric hypertrophy, moderately reduced global left ventricular systolic function, bioprosthetic mitral valve well-seated, normal position.  Mild TR.  Mild LAE.   LABORATORY DATA: CBC Latest Ref Rng & Units 01/03/2021 12/28/2020  WBC 4.0 - 10.5 K/uL 8.0 6.1  Hemoglobin 13.0 - 17.0 g/dL 14.5 14.8  Hematocrit 39.0 - 52.0 % 41.9 43.6  Platelets 150 - 400 K/uL 274 258    CMP Latest Ref Rng & Units 01/03/2021 12/28/2020  Glucose 70 - 99 mg/dL 120(H) 112(H)  BUN 6 - 20 mg/dL 36(H) 23(H)  Creatinine 0.61 - 1.24 mg/dL 2.48(H) 2.43(H)  Sodium 135 - 145 mmol/L 138 139  Potassium 3.5 - 5.1 mmol/L 3.2(L) 3.3(L)  Chloride 98 - 111 mmol/L 97(L) 97(L)  CO2 22 - 32 mmol/L 31 31  Calcium 8.9 - 10.3 mg/dL 9.5 9.7  Total Protein 6.5 - 8.1 g/dL - 7.8  Total Bilirubin 0.3 - 1.2 mg/dL - 1.1  Alkaline Phos 38 - 126 U/L - 65  AST 15 - 41 U/L - 36  ALT 0 - 44 U/L - 69(H)    Lipid Panel  No results found for: CHOL, TRIG, HDL, CHOLHDL, VLDL, LDLCALC, LDLDIRECT, LABVLDL  No components found for: NTPROBNP No results for input(s): PROBNP in the last 8760 hours. Recent Labs    12/28/20 1715  TSH 1.427    BMP Recent Labs    12/28/20 1715 01/03/21 2340  NA 139 138  K 3.3* 3.2*  CL 97* 97*  CO2 31 31  GLUCOSE 112* 120*  BUN 23* 36*  CREATININE 2.43* 2.48*  CALCIUM 9.7 9.5  GFRNONAA 32* 31*    HEMOGLOBIN A1C No results found for: HGBA1C, MPG  IMPRESSION:    ICD-10-CM   1. Atherosclerosis of native coronary artery of native heart without angina pectoris  I25.10 Lipid Panel With LDL/HDL Ratio    LDL cholesterol, direct    Lipoprotein A (LPA)    CMP14+EGFR    Hemoglobin A1c    Pro b natriuretic peptide (BNP)    2. Hx of CABG  Z95.1 Lipid Panel With LDL/HDL Ratio    LDL cholesterol, direct    Lipoprotein A (LPA)    CMP14+EGFR    Hemoglobin and hematocrit, blood    ECHOCARDIOGRAM COMPLETE    3. Chronic combined systolic and diastolic heart failure (HCC)  I50.42 Hemoglobin and  hematocrit, blood    Pro b natriuretic peptide (BNP)    carvedilol (COREG) 25 MG tablet    cloNIDine (CATAPRES) 0.2 MG tablet    ENTRESTO 49-51 MG    isosorbide mononitrate (IMDUR) 60 MG 24 hr tablet    DISCONTINUED: amLODipine (NORVASC) 5 MG tablet    DISCONTINUED: aspirin 81 MG chewable tablet    DISCONTINUED: atorvastatin (LIPITOR) 40 MG tablet    DISCONTINUED: carvedilol (COREG) 25 MG tablet    DISCONTINUED: cloNIDine (CATAPRES) 0.2 MG tablet    DISCONTINUED: ENTRESTO 49-51 MG    DISCONTINUED: isosorbide mononitrate (  IMDUR) 60 MG 24 hr tablet    DISCONTINUED: potassium chloride SA (KLOR-CON) 20 MEQ tablet    DISCONTINUED: furosemide (LASIX) 40 MG tablet    DISCONTINUED: amLODipine (NORVASC) 5 MG tablet    DISCONTINUED: aspirin 81 MG chewable tablet    DISCONTINUED: atorvastatin (LIPITOR) 40 MG tablet    4. History of mitral valve replacement with bioprosthetic valve  Z95.3 ECHOCARDIOGRAM COMPLETE    5. Benign hypertension with chronic kidney disease, stage III (HCC)  I12.9 EKG 12-Lead   N18.30     6. Mixed hyperlipidemia  E78.2 Lipid Panel With LDL/HDL Ratio    LDL cholesterol, direct    Lipoprotein A (LPA)    CMP14+EGFR    7. History of hemorrhagic stroke with residual left-sided weakness [facility])  I69.359        RECOMMENDATIONS: CHIA ROCK is a 50 y.o. 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  Chronic HFrEF, stage C, NYHA class II: Clinically appears to be euvolemic. Requesting refills on his home medications. Independently reviewed labs from 01/03/2021. Refill carvedilol, Entresto,   amlodipine. Hold Lasix and Potassium for now. Would like to uptitrate Entresto or consider Farxiga initiation next visit.  Change isosorbide mononitrate 30 mg p.o. twice daily to isosorbide mononitrate 60 mg p.o. daily. Will also wean him off of clonidine.  Currently on clonidine 0.2 mg p.o. twice daily we will transition him to daily. Patient has room to uptitrate GDMT however we will do it at the upcoming office visits once his compliance is established and lab work is updated. Check of NT proBNP, check B MP, magnesium level.  Coronary artery disease status post CABG: Surgery in 2019, per patient.  He is unaware of how many bypass he had and no prior records available regarding this. Will try to obtain records from his prior cardiologist in [place]. Echocardiogram will be ordered to evaluate for structural heart disease and left ventricular systolic function. Continue aspirin indefinitely. Refill atorvastatin Check fasting lipid profile prior to next office visit Request outside records  Hyperlipidemia: Continue statin therapy. Check fasting lipid profile.  S/p bioprosthetic mitral valve: Echocardiogram will be ordered to evaluate for structural heart disease and left ventricular systolic function.  FINAL MEDICATION LIST END OF ENCOUNTER:   Current Outpatient Medications:    albuterol (PROVENTIL HFA) 108 (90 Base) MCG/ACT inhaler, Inhale into the lungs every 6 (six) hours as needed for wheezing or shortness of breath., Disp: , Rfl:    allopurinol (ZYLOPRIM) 100 MG tablet, Take 1 tablet (100 mg total) by mouth daily., Disp: 30 tablet, Rfl: 0   ipratropium (ATROVENT HFA) 17 MCG/ACT inhaler, Inhale 2 puffs into the lungs every 6 (six) hours., Disp: , Rfl:    loratadine (CLARITIN) 10 MG tablet, Take 1 tablet (10 mg total) by mouth daily., Disp: 30 tablet, Rfl: 0   meclizine (ANTIVERT) 12.5 MG tablet, Take 1 tablet (12.5 mg total) by mouth daily as needed., Disp: 30 tablet,  Rfl: 0   amLODipine (NORVASC) 5 MG tablet, Take 1 tablet (5 mg total) by mouth every morning., Disp: 90 tablet, Rfl: 0   aspirin 81 MG chewable tablet, Chew 1 tablet (81 mg total) by mouth daily., Disp: 90 tablet, Rfl:  0   atorvastatin (LIPITOR) 40 MG tablet, Take 1 tablet (40 mg total) by mouth at bedtime., Disp: 90 tablet, Rfl: 0   carvedilol (COREG) 25 MG tablet, Take 1 tablet (25 mg total) by mouth 2 (two) times daily with a meal., Disp: 180 tablet, Rfl: 0   cloNIDine (CATAPRES) 0.2 MG tablet, Take 1 tablet (0.2 mg total) by mouth daily., Disp: 45 tablet, Rfl: 0   ENTRESTO 49-51 MG, Take 1 tablet by mouth 2 (two) times daily., Disp: 180 tablet, Rfl: 0   isosorbide mononitrate (IMDUR) 60 MG 24 hr tablet, Take 1 tablet (60 mg total) by mouth daily., Disp: 90 tablet, Rfl: 0  Orders Placed This Encounter  Procedures   Lipid Panel With LDL/HDL Ratio   LDL cholesterol, direct   Lipoprotein A (LPA)   CMP14+EGFR   Hemoglobin and hematocrit, blood   Hemoglobin A1c   Pro b natriuretic peptide (BNP)   EKG 12-Lead   ECHOCARDIOGRAM COMPLETE    There are no Patient Instructions on file for this visit.   --Continue cardiac medications as reconciled in final medication list. --Return in about 4 weeks (around 03/28/2021) for Follow up, CAD, mitral valve replacement.  Review echo and labs.. Or sooner if needed. --Continue follow-up with your primary care physician regarding the management of your other chronic comorbid conditions.  Patient's questions and concerns were addressed to his satisfaction. He voices understanding of the instructions provided during this encounter.   This note was created using a voice recognition software as a result there may be grammatical errors inadvertently enclosed that do not reflect the nature of this encounter. Every attempt is made to correct such errors.  Total encounter time 65 minutes. *Total Encounter Time as defined by the Centers for Medicare and Medicaid  Services includes, in addition to the face-to-face time of a patient visit (documented in the note above) non-face-to-face time: obtaining and reviewing outside history (reviewed prior echo results and last office note from Dr. Posey Pronto), ordering and reviewing medications (refilled more than 5 medications) also discussed change in medical therapy, ordering and reviewing tests or procedures, care coordination (communications with other health care professionals or caregivers) and documentation in the medical record.   Rex Kras, [place], [facility]  Pager: [phone] Office: [phone]

## 2021-02-28 ENCOUNTER — Other Ambulatory Visit: Payer: Self-pay

## 2021-02-28 ENCOUNTER — Encounter: Payer: Self-pay | Admitting: Cardiology

## 2021-02-28 ENCOUNTER — Ambulatory Visit: Payer: Medicaid Other | Admitting: Cardiology

## 2021-02-28 VITALS — BP 126/76 | HR 69 | Temp 98.6°F | Resp 17 | Ht 68.0 in | Wt 271.0 lb

## 2021-02-28 DIAGNOSIS — E782 Mixed hyperlipidemia: Secondary | ICD-10-CM

## 2021-02-28 DIAGNOSIS — I2510 Atherosclerotic heart disease of native coronary artery without angina pectoris: Secondary | ICD-10-CM

## 2021-02-28 DIAGNOSIS — Z953 Presence of xenogenic heart valve: Secondary | ICD-10-CM

## 2021-02-28 DIAGNOSIS — N183 Chronic kidney disease, stage 3 unspecified: Secondary | ICD-10-CM

## 2021-02-28 DIAGNOSIS — Z951 Presence of aortocoronary bypass graft: Secondary | ICD-10-CM

## 2021-02-28 DIAGNOSIS — I69359 Hemiplegia and hemiparesis following cerebral infarction affecting unspecified side: Secondary | ICD-10-CM

## 2021-02-28 DIAGNOSIS — I5042 Chronic combined systolic (congestive) and diastolic (congestive) heart failure: Secondary | ICD-10-CM

## 2021-02-28 DIAGNOSIS — I129 Hypertensive chronic kidney disease with stage 1 through stage 4 chronic kidney disease, or unspecified chronic kidney disease: Secondary | ICD-10-CM

## 2021-02-28 MED ORDER — FUROSEMIDE 40 MG PO TABS: 40.0000 mg | ORAL_TABLET | Freq: Every day | ORAL | 0 refills | Status: DC

## 2021-02-28 MED ORDER — POTASSIUM CHLORIDE CRYS ER 20 MEQ PO TBCR
20.0000 meq | EXTENDED_RELEASE_TABLET | Freq: Every day | ORAL | 0 refills | Status: DC
Start: 2021-02-28 — End: 2021-02-28

## 2021-02-28 MED ORDER — AMLODIPINE BESYLATE 5 MG PO TABS: 5.0000 mg | ORAL_TABLET | ORAL | 0 refills | Status: DC

## 2021-02-28 MED ORDER — ATORVASTATIN CALCIUM 40 MG PO TABS: 40.0000 mg | ORAL_TABLET | Freq: Every day | ORAL | 0 refills | Status: DC

## 2021-02-28 MED ORDER — CARVEDILOL 25 MG PO TABS: 25.0000 mg | ORAL_TABLET | Freq: Two times a day (BID) | ORAL | 0 refills | Status: DC

## 2021-02-28 MED ORDER — ASPIRIN 81 MG PO CHEW: 81.0000 mg | CHEWABLE_TABLET | Freq: Every day | ORAL | 0 refills | Status: DC

## 2021-02-28 MED ORDER — ASPIRIN 81 MG PO CHEW
81.0000 mg | CHEWABLE_TABLET | Freq: Every day | ORAL | 0 refills | Status: DC
Start: 2021-02-28 — End: 2021-02-28

## 2021-02-28 MED ORDER — AMLODIPINE BESYLATE 5 MG PO TABS
5.0000 mg | ORAL_TABLET | ORAL | 0 refills | Status: DC
Start: 2021-02-28 — End: 2021-02-28

## 2021-02-28 MED ORDER — ISOSORBIDE MONONITRATE ER 60 MG PO TB24: 60.0000 mg | ORAL_TABLET | Freq: Every day | ORAL | 0 refills | Status: DC

## 2021-02-28 MED ORDER — ENTRESTO 49-51 MG PO TABS: 1.0000 | ORAL_TABLET | Freq: Two times a day (BID) | ORAL | 0 refills | Status: DC

## 2021-02-28 MED ORDER — POTASSIUM CHLORIDE CRYS ER 20 MEQ PO TBCR: 20.0000 meq | EXTENDED_RELEASE_TABLET | Freq: Every day | ORAL | 0 refills | Status: DC

## 2021-02-28 MED ORDER — CLONIDINE HCL 0.2 MG PO TABS: 0.2000 mg | ORAL_TABLET | Freq: Every day | ORAL | 0 refills | Status: DC

## 2021-03-01 ENCOUNTER — Other Ambulatory Visit: Payer: Self-pay

## 2021-03-01 DIAGNOSIS — E782 Mixed hyperlipidemia: Secondary | ICD-10-CM

## 2021-03-01 DIAGNOSIS — I2510 Atherosclerotic heart disease of native coronary artery without angina pectoris: Secondary | ICD-10-CM

## 2021-03-01 DIAGNOSIS — Z951 Presence of aortocoronary bypass graft: Secondary | ICD-10-CM

## 2021-03-01 DIAGNOSIS — I5042 Chronic combined systolic (congestive) and diastolic (congestive) heart failure: Secondary | ICD-10-CM

## 2021-03-07 ENCOUNTER — Telehealth: Payer: Self-pay

## 2021-03-07 ENCOUNTER — Telehealth: Payer: Self-pay | Admitting: Cardiology

## 2021-03-07 NOTE — Telephone Encounter (Signed)
Patients entresto was denied

## 2021-03-07 NOTE — Telephone Encounter (Signed)
Pt called wanting to know what is going on with two of his medications (entresto & carvedilol); says he needs refill on both, but pharmacy told him he needed a pre-authorization in order to fill these.

## 2021-03-09 ENCOUNTER — Telehealth: Payer: Self-pay | Admitting: Cardiology

## 2021-03-09 NOTE — Telephone Encounter (Signed)
He has already been taking it not sure why it is denied.

## 2021-03-09 NOTE — Telephone Encounter (Signed)
Attempted to call pt, no answer. Left vm requesting call back. The number that was provided by Gene is not working.

## 2021-03-09 NOTE — Telephone Encounter (Signed)
Patient returned our call he was educated on patient assistant and as to why that is the next option for him obtain his medication

## 2021-03-09 NOTE — Telephone Encounter (Signed)
Woman named Gene called on Pt's behalf in regards to Pt's symptoms & medication concern, looking to move appt up if med assistant feels it's necessary. Gene's phone number is [phone], but someone can contact Pt directly to discuss concerns.

## 2021-03-11 ENCOUNTER — Ambulatory Visit (HOSPITAL_COMMUNITY)
Admission: RE | Admit: 2021-03-11 | Discharge: 2021-03-11 | Disposition: A | Discharge status: Home / Self Care | Payer: Medicaid Other | Source: Ambulatory Visit | Attending: Cardiology | Admitting: Cardiology

## 2021-03-11 ENCOUNTER — Other Ambulatory Visit: Payer: Self-pay

## 2021-03-11 DIAGNOSIS — Z951 Presence of aortocoronary bypass graft: Secondary | ICD-10-CM

## 2021-03-11 DIAGNOSIS — Z953 Presence of xenogenic heart valve: Secondary | ICD-10-CM

## 2021-03-11 NOTE — Progress Notes (Signed)
  Echocardiogram 2D Echocardiogram has been performed.  John Price 03/11/2021, 2:28 PM

## 2021-03-16 DIAGNOSIS — Z951 Presence of aortocoronary bypass graft: Secondary | ICD-10-CM | POA: Insufficient documentation

## 2021-03-16 DIAGNOSIS — Z953 Presence of xenogenic heart valve: Secondary | ICD-10-CM | POA: Insufficient documentation

## 2021-03-16 LAB — ECHOCARDIOGRAM COMPLETE
Area-P 1/2: 1.85 cm2
S' Lateral: 3.2 cm
Single Plane A4C EF: 64.6 %

## 2021-03-28 ENCOUNTER — Ambulatory Visit: Payer: Medicaid Other | Admitting: Cardiology

## 2021-03-28 ENCOUNTER — Other Ambulatory Visit: Payer: Self-pay | Admitting: Cardiology

## 2021-03-28 DIAGNOSIS — I5042 Chronic combined systolic (congestive) and diastolic (congestive) heart failure: Secondary | ICD-10-CM

## 2021-03-28 NOTE — Progress Notes (Deleted)
Date:  03/28/2021   ID:  John Price, DOB 03-Mar-1971, MRN [id number]  PCP:  Ellender Hose, NP  Cardiologist:  Tessa Lerner, DO, [facility]  (established care 02/28/2021) Former Cardiology Providers: Rubie Maid [facility])  Date: 03/28/21 Last Office Visit: 02/28/2021   No chief complaint on file.   HPI  John Price is a 50 y.o. 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  He is referred to the office at the request of Ellender Hose, NP for evaluation of reestablish care and complex cardiac history.  Patient states that he recently relocated from [place] to [facility] and is referred by his insurance company to establish care with a cardiologist locally. In [place] was under the care of Dr. Rubie Maid at [facility] last office note dated 11/05/2020.    Based on the very limited records available he had hemorrhagic stroke in 2018 with left-sided residual deficits due to uncontrolled hypertension.  The following year in 2019 he underwent coronary artery bypass grafting surgery with mitral valve replacement.  Patient does not recall how many vessels were bypassed or his coronary anatomy.  And based on the records obtained he had a bioprosthetic mitral valve.  At last office visit his medications were reconciled and ordered an echocardiogram to reevaluate LVEF and mitral valve. ***  FUNCTIONAL STATUS: No structured exercise program or daily routine.   ALLERGIES: Allergies   Allergen Reactions   Gabapentin Anaphylaxis    MEDICATION LIST PRIOR TO VISIT: No outpatient medications have been marked as taking for the 03/28/21 encounter (Appointment) with Tessa Lerner, DO.     PAST MEDICAL HISTORY: Past Medical History:  Diagnosis Date   Asthma    CHF (congestive heart failure) (HCC)    Chronic kidney disease    Coronary artery disease    Hyperlipidemia    Hypertension    Stroke (HCC)     PAST SURGICAL HISTORY: Past Surgical History:  Procedure Laterality Date   ANKLE ARTHODESIS W/ ARTHROSCOPY Left    APPENDECTOMY     CARDIAC VALVE REPLACEMENT     CORONARY ARTERY BYPASS GRAFT  2019    FAMILY HISTORY: The patient family history includes Epilepsy in his sister; Hypertension in his mother.  SOCIAL HISTORY:  The patient  reports that he has quit smoking. His smoking use included cigarettes. He has never used smokeless tobacco. He reports current alcohol use. He reports that he does not use drugs.  REVIEW OF SYSTEMS: Review of Systems  Constitutional: Negative for chills and fever.  HENT:  Negative for hoarse voice and nosebleeds.   Eyes:  Negative for discharge, double vision and pain.  Cardiovascular:  Positive for chest pain (msk - at the sternotomy site.). Negative for claudication, dyspnea on exertion, leg swelling, near-syncope, orthopnea, palpitations, paroxysmal nocturnal dyspnea and syncope.  Respiratory:  Positive for shortness of breath (chronic). Negative for hemoptysis.   Musculoskeletal:  Negative for muscle cramps and myalgias.  Gastrointestinal:  Negative for abdominal pain, constipation, diarrhea, hematemesis, hematochezia, melena, nausea and vomiting.  Neurological:  Positive for light-headedness. Negative for dizziness.   PHYSICAL EXAM:  Vitals with BMI 02/28/2021 02/03/2021 01/04/2021  Height 5\' 8"  - -  Weight 271 lbs - -  BMI 41.22 - -  Systolic 126 146  Diastolic 76 81 72  Pulse 69 85 67   No data found.  CONSTITUTIONAL:  Appears older than stated age, hemodynamically stable, no acute distress, ambulates with a cane.  SKIN: Skin is warm and dry. No rash noted. No cyanosis. No pallor. No jaundice HEAD: Normocephalic and atraumatic.  EYES: No scleral icterus.  Arcus senilis MOUTH/THROAT: Moist oral membranes.  NECK: No JVD present. No thyromegaly noted. LYMPHATIC: No visible cervical adenopathy.  CHEST Normal respiratory effort. No intercostal retractions.  Sternotomy site is clean dry and intact LUNGS: Clear to auscultation bilaterally.  No stridor. No wheezes. No rales.  CARDIOVASCULAR: Regular rate and rhythm, positive S1-S2, soft holosystolic murmur heard at the apex, no gallops or rubs. ABDOMINAL: Obese, soft, nontender, nondistended, positive bowel sounds in all 4 quadrants, no apparent ascites.  EXTREMITIES: No peripheral edema, 2+ posterior tibial pulses bilaterally. HEMATOLOGIC: No significant bruising NEUROLOGIC: Oriented to person, place, and time.  5+ right upper and lower extremity strength, 3+ left upper and lower extremity strength. Normal muscle tone.  PSYCHIATRIC: Normal mood and affect. Normal behavior. Cooperative  CARDIAC DATABASE: EKG: 02/28/2021: Normal sinus rhythm, 72 bpm, left axis, left anterior fascicular block, poor R wave progression, nonspecific ST-T changes in the high lateral leads cannot rule out ischemia.  Without underlying injury pattern.  No prior ECGs available for comparison.  Echocardiogram: 11/2019 [facility]: LVEF 35%, moderate/severe concentric hypertrophy, moderately reduced global left ventricular systolic function, bioprosthetic mitral valve well-seated, normal position.  Mild TR.  Mild LAE.  03/11/2021: LVEF 45-50%, global hypokinesis, moderate LVH, unable to comment on diastolic G due to mitral valve replacement, RV systolic function reduced (RV S' 7cm/sec and TAPSE 1.47cm), RV size mildly enlarged, normal PASP, mild LAE, bioprosthetic mitral valve in situ,  well-seated, normal structure and function.  RAP 8 mmHg.    LABORATORY DATA: CBC Latest Ref Rng & Units 01/03/2021 12/28/2020  WBC 4.0 - 10.5 K/uL 8.0 6.1  Hemoglobin 13.0 - 17.0 g/dL 12/30/2020 44.8  Hematocrit 18.5 - 52.0 % 41.9 43.6  Platelets 150 - 400 K/uL 274 258    CMP Latest Ref Rng & Units 01/03/2021 12/28/2020  Glucose 70 - 99 mg/dL 12/30/2020) 497(W)  BUN 6 - 20 mg/dL 263(Z) 85(Y)  Creatinine 0.61 - 1.24 mg/dL 85(O) 2.77(A)  Sodium 135 - 145 mmol/L 138 139  Potassium 3.5 - 5.1 mmol/L 3.2(L) 3.3(L)  Chloride 98 - 111 mmol/L 97(L) 97(L)  CO2 22 - 32 mmol/L 31 31  Calcium 8.9 - 10.3 mg/dL 9.5 9.7  Total Protein 6.5 - 8.1 g/dL - 7.8  Total Bilirubin 0.3 - 1.2 mg/dL - 1.1  Alkaline Phos 38 - 126 U/L - 65  AST 15 - 41 U/L - 36  ALT 0 - 44 U/L - 69(H)    Lipid Panel  No results found for: CHOL, TRIG, HDL, CHOLHDL, VLDL, LDLCALC, LDLDIRECT, LABVLDL  No components found for: NTPROBNP No results for input(s): PROBNP in the last 8760 hours. Recent Labs    12/28/20 1715  TSH 1.427     BMP Recent Labs    12/28/20 1715 01/03/21 2340  NA 139 138  K 3.3* 3.2*  CL 97* 97*  CO2 31 31  GLUCOSE 112* 120*  BUN 23* 36*  CREATININE 2.43* 2.48*  CALCIUM 9.7 9.5  GFRNONAA 32* 31*  HEMOGLOBIN A1C No results found for: HGBA1C, MPG  IMPRESSION:  No diagnosis found.    RECOMMENDATIONS: John Price is a 50 y.o. 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  Chronic HFrEF, stage C, NYHA class II: Clinically appears to be euvolemic. Requesting refills on his home medications. Independently reviewed labs  from 01/03/2021. Refill carvedilol, Entresto,  amlodipine. Hold Lasix and Potassium for now. Would like to uptitrate Entresto or consider Farxiga initiation next visit.  Change isosorbide mononitrate 30 mg p.o. twice daily to isosorbide mononitrate 60 mg p.o. daily. Will also wean him off of clonidine.  Currently on clonidine 0.2 mg p.o. twice daily we will transition him to daily. Patient has room to uptitrate GDMT however we will do it at the upcoming office visits once his compliance is established and lab work is updated. Check of NT proBNP, check B MP, magnesium level.  Coronary artery disease status post CABG: Surgery in 2019, per patient.  He is unaware of how many bypass he had and no prior records available regarding this. Will try to obtain records from his prior cardiologist in [facility]. Echocardiogram will be ordered to evaluate for structural heart disease and left ventricular systolic function. Continue aspirin indefinitely. Refill atorvastatin Check fasting lipid profile prior to next office visit Request outside records  Hyperlipidemia: Continue statin therapy. Check fasting lipid profile.  S/p bioprosthetic mitral valve: Echocardiogram will be ordered to evaluate for structural heart disease and left ventricular systolic function.  FINAL MEDICATION LIST END OF ENCOUNTER:   Current Outpatient Medications:    albuterol (PROVENTIL HFA) 108 (90 Base) MCG/ACT inhaler, Inhale into the lungs every 6 (six) hours as needed for wheezing or shortness of breath., Disp: , Rfl:    allopurinol (ZYLOPRIM) 100 MG tablet, Take 1 tablet (100 mg total) by mouth daily., Disp: 30 tablet, Rfl: 0   amLODipine (NORVASC) 5 MG tablet, Take 1 tablet (5 mg total) by mouth every morning., Disp: 90 tablet, Rfl: 0   aspirin 81 MG chewable tablet, Chew 1 tablet (81 mg total) by mouth daily., Disp: 90 tablet, Rfl: 0   atorvastatin (LIPITOR) 40 MG tablet, Take 1 tablet (40 mg total) by mouth  at bedtime., Disp: 90 tablet, Rfl: 0   carvedilol (COREG) 25 MG tablet, Take 1 tablet (25 mg total) by mouth 2 (two) times daily with a meal., Disp: 180 tablet, Rfl: 0   cloNIDine (CATAPRES) 0.2 MG tablet, Take 1 tablet (0.2 mg total) by mouth daily., Disp: 45 tablet, Rfl: 0   ENTRESTO 49-51 MG, Take 1 tablet by mouth 2 (two) times daily., Disp: 180 tablet, Rfl: 0   ipratropium (ATROVENT HFA) 17 MCG/ACT inhaler, Inhale 2 puffs into the lungs every 6 (six) hours., Disp: , Rfl:    isosorbide mononitrate (IMDUR) 60 MG 24 hr tablet, Take 1 tablet (60 mg total) by mouth daily., Disp: 90 tablet, Rfl: 0   loratadine (CLARITIN) 10 MG tablet, Take 1 tablet (10 mg total) by mouth daily., Disp: 30 tablet, Rfl: 0   meclizine (ANTIVERT) 12.5 MG tablet, Take 1 tablet (12.5 mg total) by mouth daily as needed., Disp: 30 tablet, Rfl: 0  No orders of the defined types were placed in  this encounter.   There are no Patient Instructions on file for this visit.   --Continue cardiac medications as reconciled in final medication list. --No follow-ups on file. Or sooner if needed. --Continue follow-up with your primary care physician regarding the management of your other chronic comorbid conditions.  Patient's questions and concerns were addressed to his satisfaction. He voices understanding of the instructions provided during this encounter.   This note was created using a voice recognition software as a result there may be grammatical errors inadvertently enclosed that do not reflect the nature of this encounter. Every attempt is made to correct such errors.  Total encounter time 65 minutes. *Total Encounter Time as defined by the Centers for Medicare and Medicaid Services includes, in addition to the face-to-face time of a patient visit (documented in the note above) non-face-to-face time: obtaining and reviewing outside history (reviewed prior echo results and last office note from Dr. Allena KatzPatel), ordering and  reviewing medications (refilled more than 5 medications) also discussed change in medical therapy, ordering and reviewing tests or procedures, care coordination (communications with other health care professionals or caregivers) and documentation in the medical record.   Tessa LernerSunit Loyce Flaming, [place]DO, Methodist Mansfield Medical CenterFACC  Pager: [phone] Office: [phone]

## 2021-03-30 ENCOUNTER — Other Ambulatory Visit: Payer: Self-pay | Admitting: Cardiology

## 2021-03-30 DIAGNOSIS — I5042 Chronic combined systolic (congestive) and diastolic (congestive) heart failure: Secondary | ICD-10-CM

## 2021-04-01 NOTE — Progress Notes (Signed)
Called and spoke to pt, pt voiced understanding.

## 2021-04-05 ENCOUNTER — Encounter: Payer: Self-pay | Admitting: Cardiology

## 2021-04-05 ENCOUNTER — Other Ambulatory Visit: Payer: Self-pay

## 2021-04-05 ENCOUNTER — Ambulatory Visit: Payer: Medicaid Other | Admitting: Cardiology

## 2021-04-05 VITALS — BP 148/87 | HR 62 | Temp 98.8°F | Resp 16 | Ht 68.0 in | Wt 266.0 lb

## 2021-04-05 DIAGNOSIS — Z951 Presence of aortocoronary bypass graft: Secondary | ICD-10-CM

## 2021-04-05 DIAGNOSIS — I69359 Hemiplegia and hemiparesis following cerebral infarction affecting unspecified side: Secondary | ICD-10-CM

## 2021-04-05 DIAGNOSIS — Z953 Presence of xenogenic heart valve: Secondary | ICD-10-CM

## 2021-04-05 DIAGNOSIS — E782 Mixed hyperlipidemia: Secondary | ICD-10-CM

## 2021-04-05 DIAGNOSIS — I5042 Chronic combined systolic (congestive) and diastolic (congestive) heart failure: Secondary | ICD-10-CM

## 2021-04-05 DIAGNOSIS — I129 Hypertensive chronic kidney disease with stage 1 through stage 4 chronic kidney disease, or unspecified chronic kidney disease: Secondary | ICD-10-CM

## 2021-04-05 DIAGNOSIS — I2510 Atherosclerotic heart disease of native coronary artery without angina pectoris: Secondary | ICD-10-CM

## 2021-04-05 MED ORDER — ENTRESTO 49-51 MG PO TABS: 1.0000 | ORAL_TABLET | Freq: Two times a day (BID) | ORAL | 0 refills | Status: DC

## 2021-04-05 NOTE — Progress Notes (Signed)
Date:  04/05/2021   ID:  John Price, DOB 10-Feb-1971, MRN [id number]  PCP:  Ellender Hose, NP  Cardiologist:  Tessa Lerner, DO, [facility]  (established care 02/28/2021) Former Cardiology Providers: Rubie Maid [facility])  Date: 04/05/21 Last Office Visit: 02/28/2021  Chief Complaint  Patient presents with   Coronary Artery Disease   mitral valve replacement   Results   Follow-up   HPI  John Price is a 50 y.o. 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  He is referred to the office at the request of Ellender Hose, NP for evaluation of reestablish care and complex cardiac history.  Patient states that he recently relocated from [place] to [facility] and is referred by his insurance company to establish care with a cardiologist locally. In [place] was under the care of Dr. Rubie Maid at [facility] last office note dated 11/05/2020.    Based on the very limited records available he had hemorrhagic stroke in 2018 with left-sided residual deficits due to uncontrolled hypertension.  The following year in 2019 he underwent coronary artery bypass grafting surgery with mitral valve replacement per patient.  Patient does not recall how many vessels were bypassed or his coronary anatomy.  And based on the records obtained he had a bioprosthetic mitral valve.  At the last office visit patient is medications were refilled.  Echocardiogram was ordered to  reevaluate LVEF and mitral valve prosthesis.  LVEF mildly reduced and bioprosthetic mitral valve appears to be functioning within normal limits.  He denies any chest pain or shortness of breath at rest or with effort related activities.  Still have not been unable to obtain records from his prior providers to better understand his coronary anatomy and the care that he has received thus far.  Had recommended weaning off of the clonidine; however, he continues to take the same amount.  No hospitalizations or urgent care visits for cardiovascular symptoms.  Patient had labs at his PCPs office.  Not available for me to review during today's encounter.  FUNCTIONAL STATUS: No structured exercise program or daily routine.   ALLERGIES: Allergies  Allergen Reactions   Gabapentin Anaphylaxis    MEDICATION LIST PRIOR TO VISIT: Current Meds  Medication Sig   albuterol (VENTOLIN HFA) 108 (90 Base) MCG/ACT inhaler Inhale into the lungs every 6 (six) hours as needed for wheezing or shortness of breath.   amLODipine (NORVASC) 5 MG tablet Take 1 tablet (5 mg total) by mouth every morning.   aspirin 81 MG chewable tablet Chew 1 tablet (81 mg total) by mouth daily.   carvedilol (COREG) 25 MG tablet Take 1 tablet (25 mg total) by mouth 2 (two) times daily with a meal.   cloNIDine (CATAPRES) 0.2 MG tablet Take 1 tablet (0.2 mg total) by mouth daily.   furosemide (LASIX) 40 MG tablet Take 40 mg by mouth daily.   ipratropium (ATROVENT HFA) 17 MCG/ACT inhaler Inhale 2 puffs into the lungs every 6 (six) hours.   isosorbide mononitrate (IMDUR) 60 MG 24 hr tablet Take 1 tablet (60  mg total) by mouth daily.   loratadine (CLARITIN) 10 MG tablet Take 1 tablet (10 mg total) by mouth daily.   meclizine (ANTIVERT) 12.5 MG tablet Take 1 tablet (12.5 mg total) by mouth daily as needed.   potassium chloride SA (KLOR-CON) 20 MEQ tablet Take 20 mEq by mouth daily.   [DISCONTINUED] ENTRESTO 49-51 MG Take 1 tablet by mouth 2  (two) times daily.     PAST MEDICAL HISTORY: Past Medical History:  Diagnosis Date   Asthma    CHF (congestive heart failure) (HCC)    Chronic kidney disease    Coronary artery disease    Hyperlipidemia    Hypertension    Stroke (HCC)     PAST SURGICAL HISTORY: Past Surgical History:  Procedure Laterality Date   ANKLE ARTHODESIS W/ ARTHROSCOPY Left    APPENDECTOMY     CARDIAC VALVE REPLACEMENT     CORONARY ARTERY BYPASS GRAFT  2019    FAMILY HISTORY: The patient family history includes Epilepsy in his sister; Hypertension in his mother.  SOCIAL HISTORY:  The patient  reports that he has quit smoking. His smoking use included cigarettes. He has never used smokeless tobacco. He reports current alcohol use. He reports that he does not use drugs.  REVIEW OF SYSTEMS: Review of Systems  Constitutional: Negative for chills and fever.  HENT:  Negative for hoarse voice and nosebleeds.   Eyes:  Negative for discharge, double vision and pain.  Cardiovascular:  Positive for chest pain (msk - at the sternotomy site.). Negative for claudication, dyspnea on exertion, leg swelling, near-syncope, orthopnea, palpitations, paroxysmal nocturnal dyspnea and syncope.  Respiratory:  Positive for shortness of breath (chronic). Negative for hemoptysis.   Musculoskeletal:  Negative for muscle cramps and myalgias.  Gastrointestinal:  Negative for abdominal pain, constipation, diarrhea, hematemesis, hematochezia, melena, nausea and vomiting.  Neurological:  Positive for light-headedness. Negative for dizziness.   PHYSICAL EXAM: Vitals with BMI 04/05/2021 02/28/2021 02/03/2021  Height 5\' 8"  5\' 8"  -  Weight 266 lbs 271 lbs -  BMI 40.45 41.22 -  Systolic 148 126  Diastolic 87 76 81  Pulse 62 69 85   Orthostatic VS for the past 72 hrs (Last 3 readings):  Orthostatic BP Patient Position BP Location Cuff Size Orthostatic Pulse  04/05/21 1605 145/89 Standing Left Arm Large 77  04/05/21 1604 128/69  Sitting Left Arm Large 68  04/05/21 1603 145/77 Supine Left Arm Large 60   CONSTITUTIONAL: Appears older than stated age, hemodynamically stable, no acute distress, ambulates with a cane.  SKIN: Skin is warm and dry. No rash noted. No cyanosis. No pallor. No jaundice HEAD: Normocephalic and atraumatic.  EYES: No scleral icterus.  Arcus senilis MOUTH/THROAT: Moist oral membranes.  NECK: No JVD present. No thyromegaly noted. LYMPHATIC: No visible cervical adenopathy.  CHEST Normal respiratory effort. No intercostal retractions.  Sternotomy site is clean dry and intact LUNGS: Clear to auscultation bilaterally.  No stridor. No wheezes. No rales.  CARDIOVASCULAR: Regular rate and rhythm, positive S1-S2, soft holosystolic murmur heard at the apex, no gallops or rubs. ABDOMINAL: Obese, soft, nontender, nondistended, positive bowel sounds in all 4 quadrants, no apparent ascites.  EXTREMITIES: No peripheral edema, 2+ posterior tibial pulses bilaterally. HEMATOLOGIC: No significant bruising NEUROLOGIC: Oriented to person, place, and time.  5+ right upper and lower extremity strength, 3+ left upper and lower extremity strength. Normal muscle tone.  PSYCHIATRIC: Normal mood and affect. Normal behavior. Cooperative  No significant change in physical examination since  last office encounter.  CARDIAC DATABASE: EKG: 02/28/2021: Normal sinus rhythm, 72 bpm, left axis, left anterior fascicular block, poor R wave progression, nonspecific ST-T changes in the high lateral leads cannot rule out ischemia.  Without underlying injury pattern.  No prior ECGs available for comparison.  Echocardiogram: 11/2019 [facility]: LVEF 35%, moderate/severe concentric hypertrophy, moderately reduced global left ventricular systolic function, bioprosthetic mitral valve well-seated, normal position.  Mild TR.  Mild LAE.  03/11/2021: LVEF 45-50%, global hypokinesis, moderate LVH, unable to comment on diastolic G due to  mitral valve replacement, RV systolic function reduced (RV S' 7cm/sec and TAPSE 1.47cm), RV size mildly enlarged, normal PASP, mild LAE, bioprosthetic mitral valve in situ, well-seated, normal structure and function.  RAP 8 mmHg.    LABORATORY DATA: CBC Latest Ref Rng & Units 01/03/2021 12/28/2020  WBC 4.0 - 10.5 K/uL 8.0 6.1  Hemoglobin 13.0 - 17.0 g/dL 16.114.5 09.614.8  Hematocrit 04.539.0 - 52.0 % 41.9 43.6  Platelets 150 - 400 K/uL 274 258    CMP Latest Ref Rng & Units 01/03/2021 12/28/2020  Glucose 70 - 99 mg/dL 409(W120(H) 119(J112(H)  BUN 6 - 20 mg/dL 47(W36(H) 29(F23(H)  Creatinine 0.61 - 1.24 mg/dL 6.21(H2.48(H) 0.86(V2.43(H)  Sodium 135 - 145 mmol/L 138 139  Potassium 3.5 - 5.1 mmol/L 3.2(L) 3.3(L)  Chloride 98 - 111 mmol/L 97(L) 97(L)  CO2 22 - 32 mmol/L 31 31  Calcium 8.9 - 10.3 mg/dL 9.5 9.7  Total Protein 6.5 - 8.1 g/dL - 7.8  Total Bilirubin 0.3 - 1.2 mg/dL - 1.1  Alkaline Phos 38 - 126 U/L - 65  AST 15 - 41 U/L - 36  ALT 0 - 44 U/L - 69(H)    Lipid Panel  No results found for: CHOL, TRIG, HDL, CHOLHDL, VLDL, LDLCALC, LDLDIRECT, LABVLDL  No components found for: NTPROBNP No results for input(s): PROBNP in the last 8760 hours. Recent Labs    12/28/20 1715  TSH 1.427    BMP Recent Labs    12/28/20 1715 01/03/21 2340  NA 139 138  K 3.3* 3.2*  CL 97* 97*  CO2 31 31  GLUCOSE 112* 120*  BUN 23* 36*  CREATININE 2.43* 2.48*  CALCIUM 9.7 9.5  GFRNONAA 32* 31*    HEMOGLOBIN A1C No results found for: HGBA1C, MPG  IMPRESSION:    ICD-10-CM   1. Chronic combined systolic and diastolic heart failure (HCC)  H84.69I50.42     2. Atherosclerosis of native coronary artery of native heart without angina pectoris  I25.10     3. Hx of CABG  Z95.1     4. History of mitral valve replacement with bioprosthetic valve  Z95.3     5. Benign hypertension with chronic kidney disease, stage III (HCC)  I12.9    N18.30     6. Mixed hyperlipidemia  E78.2     7. History of hemorrhagic stroke with residual left-sided  weakness [facility](HCC)  I69.359         RECOMMENDATIONS: John ShutterJermaine D Price is a 50 y.o. 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  Chronic HFrEF, stage C, NYHA class II: Euvolemic, not in congestive heart failure. Most recent echocardiogram results reviewed with the patient.  LVEF is  mildly reduced which is improved compared to prior records. Patient is compliant with his medical therapy. Recommend weaning off of clonidine over the next 4 weeks instructions provided to the patient. If renal function allows could further uptitrate Entresto. Depending on GFR may consider the initiation of Farxiga at a later date. Patient states that he has had labs recently performed at his PCP since last office visit.  We will request records. Low-salt diet recommended. Would benefit from sleep study evaluation -for now we will defer providing referral to PCP.  Coronary artery disease status post CABG: Surgery in 2019, per patient.  He is unaware of how many bypass he had and no prior records available regarding this. Continue aspirin and statin therapy. Will obtain labs from his PCPs office. Will request records from [facility] (CABG op note, echo, discharge note, echo, cath report)   Hyperlipidemia: Continue statin therapy. Check fasting lipid profile.  S/p bioprosthetic mitral valve: Most recent echocardiogram notes bioprosthetic mitral valve to be functioning appropriately. Continue to monitor.  FINAL MEDICATION LIST END OF ENCOUNTER:   Current Outpatient Medications:    albuterol (VENTOLIN HFA) 108 (90  Base) MCG/ACT inhaler, Inhale into the lungs every 6 (six) hours as needed for wheezing or shortness of breath., Disp: , Rfl:    amLODipine (NORVASC) 5 MG tablet, Take 1 tablet (5 mg total) by mouth every morning., Disp: 90 tablet, Rfl: 0   aspirin 81 MG chewable tablet, Chew 1 tablet (81 mg total) by mouth daily., Disp: 90 tablet, Rfl: 0   carvedilol (COREG) 25 MG tablet, Take 1 tablet (25 mg total) by mouth 2 (two) times daily with a meal., Disp: 180 tablet, Rfl: 0   cloNIDine (CATAPRES) 0.2 MG tablet, Take 1 tablet (0.2 mg total) by mouth daily., Disp: 45 tablet, Rfl: 0   furosemide (LASIX) 40 MG tablet, Take 40 mg by mouth daily., Disp: , Rfl:    ipratropium (ATROVENT HFA) 17 MCG/ACT inhaler, Inhale 2 puffs into the lungs every 6 (six) hours., Disp: , Rfl:    isosorbide mononitrate (IMDUR) 60 MG 24 hr tablet, Take 1 tablet (60 mg total) by mouth daily., Disp: 90 tablet, Rfl: 0   loratadine (CLARITIN) 10 MG tablet, Take 1 tablet (10 mg total) by mouth daily., Disp: 30 tablet, Rfl: 0   meclizine (ANTIVERT) 12.5 MG tablet, Take 1 tablet (12.5 mg total) by mouth daily as needed., Disp: 30 tablet, Rfl: 0   potassium chloride SA (KLOR-CON) 20 MEQ tablet, Take 20 mEq by mouth daily., Disp: , Rfl:    ENTRESTO 49-51 MG, Take 1 tablet by mouth 2 (two) times daily., Disp: 180 tablet, Rfl: 0  No orders of the defined types were placed in this encounter.   Patient Instructions  Week 1: Clonidine 0.2mg  po qday  Week 2: Clonidine 0.2mg   po every other day.  Week 3. Clonidine 0.2mg  po every 48 hours.  Week 4. Stop.    --Continue cardiac medications as reconciled in final medication list. --Return in about 4 weeks (around 05/03/2021) for Follow up, heart failure management., CAD. Or sooner if needed. --Continue follow-up with your primary care physician regarding the management of your other chronic comorbid conditions.  Patient's questions and concerns were addressed to his satisfaction. He voices  understanding of the instructions provided during this encounter.   This note was created using a voice recognition software as a result there may be grammatical errors inadvertently enclosed that do not reflect the nature of this  encounter. Every attempt is made to correct such errors.  Tessa Lerner, [place], [facility]  Pager: [phone] Office: [phone]

## 2021-04-05 NOTE — Patient Instructions (Addendum)
Week 1: Clonidine 0.2mg  po qday  Week 2: Clonidine 0.2mg   po every other day.  Week 3. Clonidine 0.2mg  po every 48 hours.  Week 4. Stop.

## 2021-04-14 ENCOUNTER — Other Ambulatory Visit: Payer: Self-pay | Admitting: Cardiology

## 2021-04-14 DIAGNOSIS — I5042 Chronic combined systolic (congestive) and diastolic (congestive) heart failure: Secondary | ICD-10-CM

## 2021-04-28 ENCOUNTER — Other Ambulatory Visit: Payer: Self-pay | Admitting: Nephrology

## 2021-04-28 DIAGNOSIS — N1832 Chronic kidney disease, stage 3b: Secondary | ICD-10-CM

## 2021-05-02 ENCOUNTER — Ambulatory Visit (INDEPENDENT_AMBULATORY_CARE_PROVIDER_SITE_OTHER): Payer: Medicaid Other | Admitting: Internal Medicine

## 2021-05-02 ENCOUNTER — Encounter: Payer: Self-pay | Admitting: Internal Medicine

## 2021-05-02 ENCOUNTER — Other Ambulatory Visit: Payer: Self-pay

## 2021-05-02 VITALS — BP 122/94 | HR 76 | Resp 24 | Ht 66.5 in | Wt 270.0 lb

## 2021-05-02 DIAGNOSIS — I5042 Chronic combined systolic (congestive) and diastolic (congestive) heart failure: Secondary | ICD-10-CM

## 2021-05-02 DIAGNOSIS — Z953 Presence of xenogenic heart valve: Secondary | ICD-10-CM | POA: Diagnosis not present

## 2021-05-02 DIAGNOSIS — Z23 Encounter for immunization: Secondary | ICD-10-CM | POA: Diagnosis not present

## 2021-05-02 DIAGNOSIS — I10 Essential (primary) hypertension: Secondary | ICD-10-CM

## 2021-05-02 DIAGNOSIS — N189 Chronic kidney disease, unspecified: Secondary | ICD-10-CM

## 2021-05-02 DIAGNOSIS — I429 Cardiomyopathy, unspecified: Secondary | ICD-10-CM

## 2021-05-02 DIAGNOSIS — E785 Hyperlipidemia, unspecified: Secondary | ICD-10-CM | POA: Diagnosis not present

## 2021-05-02 DIAGNOSIS — K219 Gastro-esophageal reflux disease without esophagitis: Secondary | ICD-10-CM

## 2021-05-02 DIAGNOSIS — Z8679 Personal history of other diseases of the circulatory system: Secondary | ICD-10-CM

## 2021-05-02 DIAGNOSIS — Z951 Presence of aortocoronary bypass graft: Secondary | ICD-10-CM

## 2021-05-02 DIAGNOSIS — R739 Hyperglycemia, unspecified: Secondary | ICD-10-CM

## 2021-05-02 DIAGNOSIS — J3089 Other allergic rhinitis: Secondary | ICD-10-CM

## 2021-05-02 MED ORDER — ASPIRIN 81 MG PO CHEW: CHEWABLE_TABLET | ORAL | 11 refills | Status: DC

## 2021-05-02 MED ORDER — FAMOTIDINE 40 MG PO TABS: ORAL_TABLET | ORAL | 11 refills | Status: DC

## 2021-05-02 MED ORDER — VITAMIN D (ERGOCALCIFEROL) 50000 UNITS PO CAPS: 1.0000 | ORAL_CAPSULE | ORAL | Status: DC

## 2021-05-02 MED ORDER — SPIRIVA HANDIHALER 18 MCG IN CAPS: 18.0000 ug | ORAL_CAPSULE | Freq: Every day | RESPIRATORY_TRACT | 11 refills | Status: DC

## 2021-05-02 MED ORDER — CARVEDILOL 25 MG PO TABS: ORAL_TABLET | ORAL | 11 refills | Status: DC

## 2021-05-02 MED ORDER — LORATADINE 10 MG PO TABS: 10.0000 mg | ORAL_TABLET | Freq: Every day | ORAL | 11 refills | Status: DC

## 2021-05-02 MED ORDER — ENTRESTO 49-51 MG PO TABS: ORAL_TABLET | ORAL | 11 refills | Status: DC

## 2021-05-02 MED ORDER — ATORVASTATIN CALCIUM 40 MG PO TABS: ORAL_TABLET | ORAL | 11 refills | Status: DC

## 2021-05-02 MED ORDER — CLONIDINE HCL 0.2 MG PO TABS: ORAL_TABLET | ORAL | 11 refills | Status: DC

## 2021-05-02 MED ORDER — VITAMIN D (ERGOCALCIFEROL) 50000 UNITS PO CAPS: 1.0000 | ORAL_CAPSULE | ORAL | 11 refills | Status: DC

## 2021-05-02 MED ORDER — ATROVENT HFA 17 MCG/ACT IN AERS: 2.0000 | INHALATION_SPRAY | Freq: Four times a day (QID) | RESPIRATORY_TRACT | 11 refills | Status: DC

## 2021-05-02 MED ORDER — ISOSORBIDE MONONITRATE ER 60 MG PO TB24: ORAL_TABLET | ORAL | 11 refills | Status: DC

## 2021-05-02 MED ORDER — LORATADINE 10 MG PO TABS: ORAL_TABLET | ORAL | 11 refills | Status: DC

## 2021-05-02 MED ORDER — FUROSEMIDE 40 MG PO TABS: ORAL_TABLET | ORAL | 11 refills | Status: DC

## 2021-05-02 MED ORDER — AMLODIPINE BESYLATE 5 MG PO TABS: ORAL_TABLET | ORAL | 11 refills | Status: DC

## 2021-05-02 MED ORDER — MECLIZINE HCL 12.5 MG PO TABS: ORAL_TABLET | ORAL | 4 refills | Status: DC

## 2021-05-02 MED ORDER — ALBUTEROL SULFATE HFA 108 (90 BASE) MCG/ACT IN AERS: INHALATION_SPRAY | RESPIRATORY_TRACT | 1 refills | Status: DC

## 2021-05-02 NOTE — Patient Instructions (Signed)
Take Vitamin D and Atorvastatin with evening meal and Famotidine at bedtime.

## 2021-05-02 NOTE — Progress Notes (Signed)
Subjective:    Patient ID: John Price, male   DOB: 06/08/1971, 50 y.o.   MRN: [id number]   HPI  Originally from [place]:  [place].   Moved to [place] in May.   Hypertension:  diagnosed more than 20 years ago.  Was not compliant with meds as he did not like medication side effects--made him drowsy.    2.  CAD/cardiomyopathy:  CABG with bioprosthetic mitral valve in 2019 performed in [place], [place].  Also with history of hemorrhagic stroke in 2018.  Established with Dr. Tessa Lerner, at [facility] locally.  Previously followed by a Dr. Rubie Maid at [facility]--last seen there 11/05/2020 (in [place]).    03/11/2021 Echo showed 45-50% EF with global hypokinesis, moderate LVH.  Bioprosthetic Mitral valve with normal structure and function.  In 11/2019 EF was lower at 35%.  3.  CKD:  Followed at [facility], not clear which provider.  He is to start Viatmin D 50,000 units weekly by mouth on Fridays.  Also a complication of poorly controlled hypertension.  Had temporary dialysis during his time with the stroke and perhaps also with CABG as well.  4.  Hemorrhagic Stroke in 2018:  Also in [place], [place].  Described left facial numbness and weakness--extended to entire left side as symptoms from stroke.  Still with some numbness and weakness in entire left side.  Also states has visual field cut on left as well.  States only involves left eye.  Felt to occur secondary to poor control of hypertension.    5.  Hyperlipidemia:  Atorvastatin was removed from med list on 02/28/2021, though appears Dr. Odis Hollingshead documents him to still be taking with note on 04/05/21.  Will need to clarify.  Pt. States he is taking Atorvstatin every night at 10 p.m.  6.  HM:  has not had influenza, Td vaccines.  No Moderna booster.  7.  Always clearing throat.  Especially when on back.  Feels hoarse.  Always feels like something is in his throat.  Cannot say if he gets acid in his throat.  Does  state he feels he needs to belch a lot, which he has difficulty doing, but feels when he does belch, his throat feels better.  If he drinks water, helps.  Halls cough drops soothe.  Coughs and then belches as sitting here and states he does that all day.   Does eat tomatoes and onions.   No NSAIDS. Does eat chocolate.   Drinks Coke every night with dinner.  No history of DM.   Used to smoke, but stopped with his cardiac surgery in 2019. Does drink alcohol:  drinks a couple of beers at holidays or special occasions.  Current Meds  Medication Sig   albuterol (VENTOLIN HFA) 108 (90 Base) MCG/ACT inhaler Inhale into the lungs every 6 (six) hours as needed for wheezing or shortness of breath.   amLODipine (NORVASC) 5 MG tablet Take 1 tablet (5 mg total) by mouth every morning.   aspirin 81 MG chewable tablet Chew 1 tablet (81 mg total) by mouth daily.   carvedilol (COREG) 25 MG tablet Take 1 tablet (25 mg total) by mouth 2 (two) times daily with a meal.   cloNIDine (CATAPRES) 0.2 MG tablet TAKE 1 TABLET(0.2 MG) BY MOUTH DAILY   ENTRESTO 49-51 MG Take 1 tablet by mouth 2 (two) times daily.   furosemide (LASIX) 40 MG tablet Take 40 mg by mouth daily.   ipratropium (ATROVENT HFA)  17 MCG/ACT inhaler Inhale 2 puffs into the lungs every 6 (six) hours.   isosorbide mononitrate (IMDUR) 60 MG 24 hr tablet Take 1 tablet (60 mg total) by mouth daily.   loratadine (CLARITIN) 10 MG tablet Take 1 tablet (10 mg total) by mouth daily.   potassium chloride SA (KLOR-CON) 20 MEQ tablet Take 20 mEq by mouth daily.   Allergies  Allergen Reactions   Gabapentin Anaphylaxis   Past Medical History:  Diagnosis Date   Asthma    CHF (congestive heart failure) (HCC)    Chronic kidney disease    Coronary artery disease    Hyperlipidemia    Hypertension    Primary hypertension 12/28/2020   Stroke [facility])     Past Surgical History:  Procedure Laterality Date   ANKLE ARTHODESIS W/ ARTHROSCOPY Left    APPENDECTOMY      CARDIAC VALVE REPLACEMENT     CORONARY ARTERY BYPASS GRAFT  2019   Family History  Problem Relation Age of Onset   Hypertension Mother    Kidney disease Mother    Epilepsy Sister    Early death Sister    Seizures Sister    Hypertension Brother    Hypertension Brother    Social History   Socioeconomic History   Marital status: Single    Spouse name: Not on file   Number of children: 3   Years of education: 12   Highest education level: 12th grade  Occupational History   Not on file  Tobacco Use   Smoking status: Former    Packs/day: 0.25    Years: 12.00    Pack years: 3.00    Types: Cigarettes    Quit date: 11/28/2017    Years since quitting: 4.0   Smokeless tobacco: Never   Tobacco comments:    Only smoked when he would drink. Not anymore  Vaping Use   Vaping Use: Never used  Substance and Sexual Activity   Alcohol use: Yes    Comment: 2 beers rarely/occasionally.   Drug use: Never   Sexual activity: Not Currently  Other Topics Concern   Not on file  Social History Narrative   Lives with his mother in [place].  He is disabled   Chemical engineer Strain: Not on file  Food Insecurity: Not on file  Transportation Needs: Not on file  Physical Activity: Not on file  Stress: Not on file  Social Connections: Not on file  Intimate Partner Violence: Not on file     Review of Systems    Objective:   BP (!) 122/94 (BP Location: Right Arm, Patient Position: Sitting, Cuff Size: Normal)   Pulse 76   Resp (!) 24   Ht 5' 6.5" (1.689 m)   Wt 270 lb (122.5 kg)   BMI 42.93 kg/m   Physical Exam HEENT:  PERRL, EOMI, TMs pearly gray, Nasal mucosa somewhat boggy, mild cobbling of posterior pharynx.   Neck:  Supple, No adenopathy, no thyromegaly Chest:  CTA CV:  RRR with Grade I/VI  Systolic murmur at low LSB.  No radiation to carotids.  Carotid, radial and DP pulses normal and equal.  No edema Abd:  S, NT, No HSM or mass, +  BS Neuro:  A & O x 3, CN II-XII grossly intact.   Coordination--wide based gait.  Assessment & Plan    Hypertension:  not quite at goal, but just establishing.    2.  CKD:  Patient just starting Vitamin  D this week per [facility].  Cannot read signature to clarify provider there.  3.  Ischemic cardiomyopathy:  improving with treatment.  As per [facility].  Calling cardio clinic. Release for old records already requested from cardiology.  Appears compensated.  4.  GERD/Allergies:  not clear if symptoms all GERD or if allergies as well with findings on exam.  Loratadine 10 mg daily and Fexofenadine 40 mg at bedtime.  Elevate HOB and other GERD precautions discussed, particularly inciting food and drink to avoid .  5.  Hyperlipidemia:  to continue Atorvastatin.  6.  HM:  Moderna bivalent booster.

## 2021-05-03 LAB — HGB A1C W/O EAG: Hgb A1c MFr Bld: 5.2 % (ref 4.8–5.6)

## 2021-05-04 ENCOUNTER — Other Ambulatory Visit: Payer: Medicaid Other

## 2021-05-06 ENCOUNTER — Other Ambulatory Visit: Payer: Self-pay | Admitting: Nephrology

## 2021-05-06 DIAGNOSIS — I129 Hypertensive chronic kidney disease with stage 1 through stage 4 chronic kidney disease, or unspecified chronic kidney disease: Secondary | ICD-10-CM

## 2021-05-06 DIAGNOSIS — E876 Hypokalemia: Secondary | ICD-10-CM

## 2021-05-06 DIAGNOSIS — N1832 Chronic kidney disease, stage 3b: Secondary | ICD-10-CM

## 2021-05-06 DIAGNOSIS — N2581 Secondary hyperparathyroidism of renal origin: Secondary | ICD-10-CM

## 2021-05-10 ENCOUNTER — Other Ambulatory Visit: Payer: Self-pay

## 2021-05-10 ENCOUNTER — Encounter: Payer: Self-pay | Admitting: Cardiology

## 2021-05-10 ENCOUNTER — Ambulatory Visit: Payer: Medicaid Other | Admitting: Cardiology

## 2021-05-10 VITALS — BP 140/88 | HR 62 | Resp 16 | Ht 66.0 in | Wt 268.4 lb

## 2021-05-10 DIAGNOSIS — Z953 Presence of xenogenic heart valve: Secondary | ICD-10-CM

## 2021-05-10 DIAGNOSIS — I5042 Chronic combined systolic (congestive) and diastolic (congestive) heart failure: Secondary | ICD-10-CM

## 2021-05-10 DIAGNOSIS — I129 Hypertensive chronic kidney disease with stage 1 through stage 4 chronic kidney disease, or unspecified chronic kidney disease: Secondary | ICD-10-CM

## 2021-05-10 DIAGNOSIS — I69359 Hemiplegia and hemiparesis following cerebral infarction affecting unspecified side: Secondary | ICD-10-CM

## 2021-05-10 DIAGNOSIS — Z951 Presence of aortocoronary bypass graft: Secondary | ICD-10-CM

## 2021-05-10 DIAGNOSIS — I2510 Atherosclerotic heart disease of native coronary artery without angina pectoris: Secondary | ICD-10-CM

## 2021-05-10 DIAGNOSIS — E782 Mixed hyperlipidemia: Secondary | ICD-10-CM

## 2021-05-10 NOTE — Progress Notes (Signed)
Date:  05/10/2021   ID:  John Price, DOB [birth year], MRN [id number]  PCP:  Julieanne Manson, MD  Cardiologist:  Tessa Lerner, DO, [facility]  (established care 02/28/2021) Former Cardiology Providers: Rubie Maid [facility])  Date: 05/10/21 Last Office Visit: 04/05/2021  Chief Complaint  Patient presents with   heart failure management   Coronary Artery Disease   Follow-up   HPI  John Price is a 50 y.o. 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  He is referred to the office at the request of Ellender Hose, NP for evaluation of reestablish care and complex cardiac history.  Patient states that he recently relocated from [place] to [facility] and is referred by his insurance company to establish care with a cardiologist locally. In [place] was under the care of Dr. Rubie Maid at [facility] last office note dated 11/05/2020.    Based on the very limited records available he had hemorrhagic stroke in 2018 with left-sided residual deficits due to uncontrolled hypertension.  The following year in 2019 he underwent coronary artery bypass grafting surgery with mitral valve replacement per patient.  Patient does not recall how many vessels were bypassed or his coronary anatomy.  And based on the records obtained he had a bioprosthetic mitral valve.  Since establishing care a echocardiogram was ordered which noted mildly reduced LVEF with normal functioning bioprosthetic mitral  valve.  Patient presents today for further medication titration.  He denies any chest pain at rest or with effort related activities.  His shortness of breath is well controlled.  No hospitalizations or urgent care visits for cardiovascular symptoms since last encounter.  FUNCTIONAL STATUS: No structured exercise program or daily routine.   ALLERGIES: Allergies  Allergen Reactions   Gabapentin Anaphylaxis    MEDICATION LIST PRIOR TO VISIT: Current Meds  Medication Sig   albuterol (VENTOLIN HFA) 108 (90 Base) MCG/ACT inhaler 2 puffs every 6 hours as needed for wheezing   amLODipine (NORVASC) 5 MG tablet 1 tab by mouth daily with morning meal   aspirin 81 MG chewable tablet 1 tab by mouth daily with morning meal   atorvastatin (LIPITOR) 40 MG tablet 1 tab by mouth with evening meal.   carvedilol (COREG) 25 MG tablet 1 tab by mouth twice daily with meals   cloNIDine (CATAPRES) 0.2 MG tablet 1 tab by mouth with evening meal   ENTRESTO 49-51 MG 1 tab by mouth twice daily with meals   famotidine (PEPCID) 40 MG tablet 1 tab by mouth at bedtime not with other meds   furosemide (LASIX) 40 MG tablet 1 tab by mouth with morning meal   isosorbide mononitrate (IMDUR) 60 MG 24 hr tablet 1 tab by mouth with morning meal   loratadine (CLARITIN) 10 MG tablet 1 tab by mouth every morning for allergies   potassium chloride SA (KLOR-CON) 20 MEQ tablet Take 20 mEq by mouth daily.   tiotropium (SPIRIVA HANDIHALER) 18 MCG inhalation capsule Place 1 capsule (18 mcg total) into inhaler and inhale daily.   Vitamin D, Ergocalciferol, 50000 units CAPS Take 1 capsule by mouth once  a week. On Fridays with evening meal     PAST MEDICAL HISTORY: Past Medical History:  Diagnosis Date   Asthma    CHF (congestive heart failure) (HCC)    Chronic kidney disease    Coronary artery disease    Hyperlipidemia    Hypertension    Primary hypertension 12/28/2020   Stroke [facility])     PAST SURGICAL HISTORY: Past  Surgical History:  Procedure Laterality Date   ANKLE ARTHODESIS W/ ARTHROSCOPY Left    APPENDECTOMY     CARDIAC VALVE REPLACEMENT     CORONARY ARTERY BYPASS GRAFT  2019    FAMILY HISTORY: The patient family history includes Early death in his sister; Epilepsy in his sister; Hypertension in his brother, brother, and mother; Seizures in his sister.  SOCIAL HISTORY:  The patient  reports that he quit smoking about 3 years ago. His smoking use included cigarettes. He has a 3.00 pack-year smoking history. He has never used smokeless tobacco. He reports current alcohol use. He reports that he does not use drugs.  REVIEW OF SYSTEMS: Review of Systems  Constitutional: Negative for chills and fever.  HENT:  Negative for hoarse voice and nosebleeds.   Eyes:  Negative for discharge, double vision and pain.  Cardiovascular:  Negative for chest pain, claudication, dyspnea on exertion, leg swelling, near-syncope, orthopnea, palpitations, paroxysmal nocturnal dyspnea and syncope.  Respiratory:  Positive for shortness of breath (chronic). Negative for hemoptysis.   Musculoskeletal:  Negative for muscle cramps and myalgias.  Gastrointestinal:  Negative for abdominal pain, constipation, diarrhea, hematemesis, hematochezia, melena, nausea and vomiting.  Neurological:  Negative for dizziness and light-headedness.   PHYSICAL EXAM: Vitals with BMI 05/10/2021 05/02/2021 05/02/2021  Height 5\' 6"  - 5' 6.5"  Weight 268 lbs 6 oz - 270 lbs  BMI 43.34 - 42.93  Systolic 140 122  Diastolic 88 94 92  Pulse 62 - 76    CONSTITUTIONAL: Appears older than stated age, hemodynamically stable, no acute distress, ambulates with a cane.  SKIN: Skin is warm and dry. No rash noted. No cyanosis. No pallor. No jaundice HEAD: Normocephalic and atraumatic.  EYES: No scleral icterus.  Arcus senilis MOUTH/THROAT: Moist oral membranes.  NECK: No JVD present. No thyromegaly noted. LYMPHATIC: No visible cervical adenopathy.   CHEST Normal respiratory effort. No intercostal retractions.  Sternotomy site is clean dry and intact LUNGS: Clear to auscultation bilaterally.  No stridor. No wheezes. No rales.  CARDIOVASCULAR: Regular rate and rhythm, positive S1-S2, soft holosystolic murmur heard at the apex, no gallops or rubs. ABDOMINAL: Obese, soft, nontender, nondistended, positive bowel sounds in all 4 quadrants, no apparent ascites.  EXTREMITIES: No peripheral edema, 2+ posterior tibial pulses bilaterally. HEMATOLOGIC: No significant bruising NEUROLOGIC: Oriented to person, place, and time.  5+ right upper and lower extremity strength, 3+ left upper and lower extremity strength. Normal muscle tone.  PSYCHIATRIC: Normal mood and affect. Normal behavior. Cooperative  No significant change in physical examination since last office encounter.  CARDIAC DATABASE: EKG: 02/28/2021: Normal sinus rhythm, 72 bpm, left axis, left anterior fascicular block, poor R wave progression, nonspecific ST-T changes in the high lateral leads cannot rule out ischemia.  Without underlying injury pattern.  No prior ECGs available for comparison.  Echocardiogram: 11/2019 [facility]: LVEF 35%, moderate/severe concentric hypertrophy, moderately reduced global left ventricular systolic function, bioprosthetic mitral valve well-seated, normal position.  Mild TR.  Mild LAE.  03/11/2021: LVEF 45-50%, global hypokinesis, moderate LVH, unable to comment on diastolic G  due to mitral valve replacement, RV systolic function reduced (RV S' 7cm/sec and TAPSE 1.47cm), RV size mildly enlarged, normal PASP, mild LAE, bioprosthetic mitral valve in situ, well-seated, normal structure and function.  RAP 8 mmHg.    LABORATORY DATA: CBC Latest Ref Rng & Units 01/03/2021 12/28/2020  WBC 4.0 - 10.5 K/uL 8.0 6.1  Hemoglobin 13.0 - 17.0 g/dL 83.1 51.7  Hematocrit 61.6 - 52.0 % 41.9 43.6  Platelets 150 - 400 K/uL 274 258    CMP Latest Ref Rng & Units  01/03/2021 12/28/2020  Glucose 70 - 99 mg/dL 073(X) 106(Y)  BUN 6 - 20 mg/dL 69(S) 85(I)  Creatinine 0.61 - 1.24 mg/dL 6.27(O) 3.50(K)  Sodium 135 - 145 mmol/L 138 139  Potassium 3.5 - 5.1 mmol/L 3.2(L) 3.3(L)  Chloride 98 - 111 mmol/L 97(L) 97(L)  CO2 22 - 32 mmol/L 31 31  Calcium 8.9 - 10.3 mg/dL 9.5 9.7  Total Protein 6.5 - 8.1 g/dL - 7.8  Total Bilirubin 0.3 - 1.2 mg/dL - 1.1  Alkaline Phos 38 - 126 U/L - 65  AST 15 - 41 U/L - 36  ALT 0 - 44 U/L - 69(H)    Lipid Panel  No results found for: CHOL, TRIG, HDL, CHOLHDL, VLDL, LDLCALC, LDLDIRECT, LABVLDL  No components found for: NTPROBNP No results for input(s): PROBNP in the last 8760 hours. Recent Labs    12/28/20 1715  TSH 1.427    BMP Recent Labs    12/28/20 1715 01/03/21 2340  NA 139 138  K 3.3* 3.2*  CL 97* 97*  CO2 31 31  GLUCOSE 112* 120*  BUN 23* 36*  CREATININE 2.43* 2.48*  CALCIUM 9.7 9.5  GFRNONAA 32* 31*    HEMOGLOBIN A1C Lab Results  Component Value Date   HGBA1C 5.2 05/02/2021    IMPRESSION:    ICD-10-CM   1. Chronic combined systolic and diastolic heart failure (HCC)  X38.18 Magnesium    Basic metabolic panel    Pro b natriuretic peptide (BNP)    2. Atherosclerosis of native coronary artery of native heart without angina pectoris  I25.10     3. Hx of CABG  Z95.1     4. History of mitral valve replacement with bioprosthetic valve  Z95.3     5. Benign hypertension with chronic kidney disease, stage III (HCC)  I12.9    N18.30     6. Mixed hyperlipidemia  E78.2     7. History of hemorrhagic stroke with residual hemiparesis [facility])  I69.359          RECOMMENDATIONS: BRAXEN DOBEK is a 50 y.o. 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  Chronic HFrEF, stage C, NYHA class II: Euvolemic, not in congestive heart failure. Echo: Noted mildly reduced LVEF with normal functioning bioprosthetic mitral valve  Most recent labs from June 2022 reviewed. Patient's GFR is borderline -therefore we will hold off on the initiation of Farxiga for now.   Will consider further up titration of Entresto at the next office visit if his kidney functions allows.   Further up titration of GDMT may be limited given his renal function.  Patient states that he is currently following up with nephrology.  If  additional blood pressure medication is required consider the addition of hydralazine. Low-salt diet recommended. Would benefit from sleep study evaluation -for now we will defer providing referral to PCP.  Coronary artery disease status post CABG: Surgery in 2019, per patient.  He is unaware of how many bypass he had and no prior records available regarding this. Continue aspirin and statin therapy. Will re-request records from [facility] (CABG op note, echo, discharge note, echo, cath report)   Hyperlipidemia: Continue statin therapy. Check fasting lipid profile.  S/p bioprosthetic mitral valve: Most recent echocardiogram notes bioprosthetic mitral valve to be functioning appropriately. Continue to monitor.  FINAL MEDICATION LIST END OF ENCOUNTER: Patient did not bring his medication bottles or list to accurately reconcile his outpatient medications during today's encounter.    Current Outpatient Medications:    albuterol (VENTOLIN HFA) 108 (90 Base) MCG/ACT inhaler, 2 puffs every 6 hours as needed for wheezing, Disp: 18 g, Rfl: 1   amLODipine (NORVASC) 5 MG tablet, 1 tab by mouth daily with morning meal, Disp: 30 tablet, Rfl: 11   aspirin 81 MG chewable tablet, 1 tab by mouth daily with  morning meal, Disp: 30 tablet, Rfl: 11   atorvastatin (LIPITOR) 40 MG tablet, 1 tab by mouth with evening meal., Disp: 30 tablet, Rfl: 11   carvedilol (COREG) 25 MG tablet, 1 tab by mouth twice daily with meals, Disp: 60 tablet, Rfl: 11   cloNIDine (CATAPRES) 0.2 MG tablet, 1 tab by mouth with evening meal, Disp: 30 tablet, Rfl: 11   ENTRESTO 49-51 MG, 1 tab by mouth twice daily with meals, Disp: 60 tablet, Rfl: 11   famotidine (PEPCID) 40 MG tablet, 1 tab by mouth at bedtime not with other meds, Disp: 30 tablet, Rfl: 11   furosemide (LASIX) 40 MG tablet, 1 tab by mouth with morning meal, Disp: 30 tablet, Rfl: 11   isosorbide mononitrate (IMDUR) 60 MG 24 hr tablet, 1 tab by mouth with morning meal, Disp: 30 tablet, Rfl: 11   loratadine (CLARITIN) 10 MG tablet, 1 tab by mouth every morning for allergies, Disp: 30 tablet, Rfl: 11   potassium chloride SA (KLOR-CON) 20 MEQ tablet, Take 20 mEq by mouth daily., Disp: , Rfl:    tiotropium (SPIRIVA HANDIHALER) 18 MCG inhalation capsule, Place 1 capsule (18 mcg total) into inhaler and inhale daily., Disp: 30 capsule, Rfl: 11   Vitamin D, Ergocalciferol, 50000 units CAPS, Take 1 capsule by mouth once a week. On Fridays with evening meal, Disp: 4 capsule, Rfl: 11  Orders Placed This Encounter  Procedures   Magnesium   Basic metabolic panel   Pro b natriuretic peptide (BNP)    There are no Patient Instructions on file for this visit.   --Continue cardiac medications as reconciled in final medication list. --Return in about 4 months (around 08/26/2021) for Follow up, heart failure management.. Or sooner if needed. --Continue follow-up with your primary care physician regarding the management of your other chronic comorbid conditions.  Patient's questions and concerns were addressed to his satisfaction. He voices understanding of the instructions provided during this encounter.   This note was created using a voice recognition software as a result there  may be grammatical errors inadvertently enclosed that do not reflect the nature of this encounter. Every attempt is made to correct such errors.  Tessa Lerner, [place], [facility]  Pager: [phone] Office: [phone]

## 2021-05-11 ENCOUNTER — Ambulatory Visit
Admission: RE | Admit: 2021-05-11 | Discharge: 2021-05-11 | Disposition: A | Discharge status: Home / Self Care | Payer: Medicaid Other | Source: Ambulatory Visit | Attending: Nephrology | Admitting: Nephrology

## 2021-05-11 DIAGNOSIS — I129 Hypertensive chronic kidney disease with stage 1 through stage 4 chronic kidney disease, or unspecified chronic kidney disease: Secondary | ICD-10-CM

## 2021-05-11 DIAGNOSIS — N1832 Chronic kidney disease, stage 3b: Secondary | ICD-10-CM

## 2021-05-11 DIAGNOSIS — E876 Hypokalemia: Secondary | ICD-10-CM

## 2021-05-11 DIAGNOSIS — N2581 Secondary hyperparathyroidism of renal origin: Secondary | ICD-10-CM

## 2021-05-18 ENCOUNTER — Other Ambulatory Visit: Payer: Self-pay

## 2021-05-18 DIAGNOSIS — I5042 Chronic combined systolic (congestive) and diastolic (congestive) heart failure: Secondary | ICD-10-CM

## 2021-06-06 ENCOUNTER — Other Ambulatory Visit: Payer: Self-pay | Admitting: Cardiology

## 2021-06-14 ENCOUNTER — Other Ambulatory Visit: Payer: Self-pay | Admitting: Cardiology

## 2021-07-21 ENCOUNTER — Emergency Department (HOSPITAL_COMMUNITY)
Admission: EM | Admit: 2021-07-21 | Discharge: 2021-07-21 | Disposition: A | Discharge status: Home / Self Care | Payer: Medicaid Other | Attending: Emergency Medicine | Admitting: Emergency Medicine

## 2021-07-21 ENCOUNTER — Emergency Department (HOSPITAL_COMMUNITY): Payer: Medicaid Other

## 2021-07-21 DIAGNOSIS — N189 Chronic kidney disease, unspecified: Secondary | ICD-10-CM | POA: Diagnosis not present

## 2021-07-21 DIAGNOSIS — Z79899 Other long term (current) drug therapy: Secondary | ICD-10-CM | POA: Diagnosis not present

## 2021-07-21 DIAGNOSIS — R1110 Vomiting, unspecified: Secondary | ICD-10-CM | POA: Diagnosis not present

## 2021-07-21 DIAGNOSIS — K219 Gastro-esophageal reflux disease without esophagitis: Secondary | ICD-10-CM | POA: Insufficient documentation

## 2021-07-21 DIAGNOSIS — I2510 Atherosclerotic heart disease of native coronary artery without angina pectoris: Secondary | ICD-10-CM | POA: Insufficient documentation

## 2021-07-21 DIAGNOSIS — Z7982 Long term (current) use of aspirin: Secondary | ICD-10-CM | POA: Diagnosis not present

## 2021-07-21 DIAGNOSIS — I509 Heart failure, unspecified: Secondary | ICD-10-CM | POA: Diagnosis not present

## 2021-07-21 DIAGNOSIS — Z87891 Personal history of nicotine dependence: Secondary | ICD-10-CM | POA: Diagnosis not present

## 2021-07-21 DIAGNOSIS — R0602 Shortness of breath: Secondary | ICD-10-CM | POA: Insufficient documentation

## 2021-07-21 DIAGNOSIS — J45909 Unspecified asthma, uncomplicated: Secondary | ICD-10-CM | POA: Insufficient documentation

## 2021-07-21 DIAGNOSIS — Z951 Presence of aortocoronary bypass graft: Secondary | ICD-10-CM | POA: Diagnosis not present

## 2021-07-21 DIAGNOSIS — I130 Hypertensive heart and chronic kidney disease with heart failure and stage 1 through stage 4 chronic kidney disease, or unspecified chronic kidney disease: Secondary | ICD-10-CM | POA: Insufficient documentation

## 2021-07-21 DIAGNOSIS — Z7951 Long term (current) use of inhaled steroids: Secondary | ICD-10-CM | POA: Diagnosis not present

## 2021-07-21 LAB — BASIC METABOLIC PANEL
Anion gap: 7 (ref 5–15)
BUN: 28 mg/dL — ABNORMAL HIGH (ref 6–20)
CO2: 28 mmol/L (ref 22–32)
Calcium: 8.9 mg/dL (ref 8.9–10.3)
Chloride: 104 mmol/L (ref 98–111)
Creatinine, Ser: 2.41 mg/dL — ABNORMAL HIGH (ref 0.61–1.24)
GFR, Estimated: 32 mL/min — ABNORMAL LOW (ref >60)
Glucose, Bld: 119 mg/dL — ABNORMAL HIGH (ref 70–99)
Potassium: 3 mmol/L — ABNORMAL LOW (ref 3.5–5.1)
Sodium: 139 mmol/L (ref 135–145)

## 2021-07-21 LAB — CBC WITH DIFFERENTIAL/PLATELET
Abs Immature Granulocytes: 0.01 10*3/uL (ref 0.00–0.07)
Basophils Absolute: 0 10*3/uL (ref 0.0–0.1)
Basophils Relative: 0 %
Eosinophils Absolute: 0.3 10*3/uL (ref 0.0–0.5)
Eosinophils Relative: 5 %
HCT: 38.4 % — ABNORMAL LOW (ref 39.0–52.0)
Hemoglobin: 13.6 g/dL (ref 13.0–17.0)
Immature Granulocytes: 0 %
Lymphocytes Relative: 40 %
Lymphs Abs: 2.7 10*3/uL (ref 0.7–4.0)
MCH: 33.2 pg (ref 26.0–34.0)
MCHC: 35.4 g/dL (ref 30.0–36.0)
MCV: 93.7 fL (ref 80.0–100.0)
Monocytes Absolute: 0.5 10*3/uL (ref 0.1–1.0)
Monocytes Relative: 8 %
Neutro Abs: 3.2 10*3/uL (ref 1.7–7.7)
Neutrophils Relative %: 47 %
Platelets: 271 10*3/uL (ref 150–400)
RBC: 4.1 MIL/uL — ABNORMAL LOW (ref 4.22–5.81)
RDW: 12 % (ref 11.5–15.5)
WBC: 6.8 10*3/uL (ref 4.0–10.5)
nRBC: 0 % (ref 0.0–0.2)

## 2021-07-21 MED ORDER — ALUM & MAG HYDROXIDE-SIMETH 200-200-20 MG/5ML PO SUSP
30.0000 mL | Freq: Once | ORAL | Status: AC
  Administered 2021-07-21: 01:00:00 30 mL via ORAL
  Filled 2021-07-21: qty 30

## 2021-07-21 MED ORDER — POTASSIUM CHLORIDE CRYS ER 20 MEQ PO TBCR
40.0000 meq | EXTENDED_RELEASE_TABLET | Freq: Once | ORAL | Status: AC
  Administered 2021-07-21: 03:00:00 40 meq via ORAL
  Filled 2021-07-21: qty 2

## 2021-07-21 NOTE — ED Provider Notes (Signed)
John Price DEPT Provider Note   CSN: [id number] Arrival date & time: 07/21/21  0002     History Chief Complaint  Patient presents with   Shortness of Breath    John Price is a 50 y.o. male.  Patient is a 50 year old male with a history of CHF, coronary artery disease, chronic kidney disease, hypertension, hyperlipidemia who presents with an episode of shortness of breath.  He said he has had a lot of belching this evening.  He felt like he had to clear his throat.  He had 1 episode of vomiting.  Following that he felt short of breath.  He feels better now although still feels like he has to belch.  No associated chest pain.  No cough or cold symptoms.  No postnasal drip.  No fevers.  No leg swelling.  He says he ate some ox tails earlier which may have triggered the symptoms.      Past Medical History:  Diagnosis Date   Asthma    CHF (congestive heart failure) ([place])    Chronic kidney disease    Coronary artery disease    Hyperlipidemia    Hypertension    Primary hypertension 12/28/2020   Stroke [facility])     Patient Active Problem List   Diagnosis Date Noted   Hyperglycemia 05/02/2021   History of mitral valve replacement with bioprosthetic valve    Hx of CABG    History of heart failure 12/28/2020   Primary hypertension 12/28/2020   Hyperlipidemia 12/28/2020    Past Surgical History:  Procedure Laterality Date   ANKLE ARTHODESIS W/ ARTHROSCOPY Left    APPENDECTOMY     CARDIAC VALVE REPLACEMENT     CORONARY ARTERY BYPASS GRAFT  2019       Family History  Problem Relation Age of Onset   Hypertension Mother    Epilepsy Sister    Early death Sister    Seizures Sister    Hypertension Brother    Hypertension Brother     Social History   Tobacco Use   Smoking status: Former    Packs/day: 0.25    Years: 12.00    Pack years: 3.00    Types: Cigarettes    Quit date: 11/28/2017    Years since quitting: 3.6   Smokeless  tobacco: Never   Tobacco comments:    Only smoked when he would drink. Not anymore  Vaping Use   Vaping Use: Never used  Substance Use Topics   Alcohol use: Yes    Comment: 2 beers rarely/occasionally.   Drug use: Never    Home Medications Prior to Admission medications   Medication Sig Start Date End Date Taking? Authorizing Provider  albuterol (VENTOLIN HFA) 108 (90 Base) MCG/ACT inhaler 2 puffs every 6 hours as needed for wheezing 05/02/21   Mack Hook, MD  amLODipine (NORVASC) 5 MG tablet TAKE 1 TABLET(5 MG) BY MOUTH EVERY MORNING 06/06/21   Tolia, Sunit, DO  aspirin 81 MG chewable tablet 1 tab by mouth daily with morning meal 05/02/21   Mack Hook, MD  atorvastatin (LIPITOR) 40 MG tablet 1 tab by mouth with evening meal. 05/02/21   Mack Hook, MD  carvedilol (COREG) 25 MG tablet TAKE 1 TABLET(25 MG) BY MOUTH TWICE DAILY WITH A MEAL 06/14/21   Tolia, Sunit, DO  cloNIDine (CATAPRES) 0.2 MG tablet 1 tab by mouth with evening meal 05/02/21   Mack Hook, MD  ENTRESTO 49-51 MG 1 tab by mouth twice daily with  meals 05/02/21   Mack Hook, MD  famotidine (PEPCID) 40 MG tablet 1 tab by mouth at bedtime not with other meds 05/02/21   Mack Hook, MD  furosemide (LASIX) 40 MG tablet 1 tab by mouth with morning meal 05/02/21   Mack Hook, MD  isosorbide mononitrate (IMDUR) 60 MG 24 hr tablet TAKE 1 TABLET(60 MG) BY MOUTH DAILY 06/06/21   Tolia, Sunit, DO  loratadine (CLARITIN) 10 MG tablet 1 tab by mouth every morning for allergies 05/02/21   Mack Hook, MD  potassium chloride SA (KLOR-CON) 20 MEQ tablet Take 20 mEq by mouth daily. 03/31/21   [provider]  tiotropium (SPIRIVA HANDIHALER) 18 MCG inhalation capsule Place 1 capsule (18 mcg total) into inhaler and inhale daily. 05/02/21   Mack Hook, MD  Vitamin D, Ergocalciferol, 50000 units CAPS Take 1 capsule by mouth once a week. On Fridays with evening meal 05/02/21    Mack Hook, MD    Allergies    Gabapentin  Review of Systems   Review of Systems  Constitutional:  Positive for fatigue. Negative for chills, diaphoresis and fever.  HENT:  Negative for congestion, rhinorrhea and sneezing.   Eyes: Negative.   Respiratory:  Positive for shortness of breath. Negative for cough and chest tightness.   Cardiovascular:  Negative for chest pain and leg swelling.  Gastrointestinal:  Positive for nausea and vomiting (X1). Negative for abdominal pain, blood in stool and diarrhea.  Genitourinary:  Negative for difficulty urinating, flank pain, frequency and hematuria.  Musculoskeletal:  Negative for arthralgias and back pain.  Skin:  Negative for rash.  Neurological:  Negative for dizziness, speech difficulty, weakness, numbness and headaches.   Physical Exam Updated Vital Signs BP 136/89    Pulse 78    Temp 98.3 F (36.8 C) (Oral)    Resp 14    SpO2 93%   Physical Exam Constitutional:      Appearance: He is well-developed.  HENT:     Head: Normocephalic and atraumatic.     Mouth/Throat:     Mouth: Mucous membranes are moist.     Pharynx: No oropharyngeal exudate or posterior oropharyngeal erythema.  Eyes:     Pupils: Pupils are equal, round, and reactive to light.  Cardiovascular:     Rate and Rhythm: Normal rate and regular rhythm.     Heart sounds: Normal heart sounds.  Pulmonary:     Effort: Pulmonary effort is normal. No respiratory distress.     Breath sounds: Normal breath sounds. No wheezing or rales.  Chest:     Chest wall: No tenderness.  Abdominal:     General: Bowel sounds are normal.     Palpations: Abdomen is soft.     Tenderness: There is no abdominal tenderness. There is no guarding or rebound.  Musculoskeletal:        General: Normal range of motion.     Cervical back: Normal range of motion and neck supple.  Lymphadenopathy:     Cervical: No cervical adenopathy.  Skin:    General: Skin is warm and dry.      Findings: No rash.  Neurological:     Mental Status: He is alert and oriented to person, place, and time.    ED Results / Procedures / Treatments   Labs (all labs ordered are listed, but only abnormal results are displayed) Labs Reviewed  BASIC METABOLIC PANEL - Abnormal; Notable for the following components:      Result Value   Potassium  3.0 (*)    Glucose, Bld 119 (*)    BUN 28 (*)    Creatinine, Ser 2.41 (*)    GFR, Estimated 32 (*)    All other components within normal limits  CBC WITH DIFFERENTIAL/PLATELET - Abnormal; Notable for the following components:   RBC 4.10 (*)    HCT 38.4 (*)    All other components within normal limits    EKG EKG Interpretation  Date/Time:  Thursday July 21 2021 00:22:08 EST Ventricular Rate:  98 PR Interval:  197 QRS Duration: 104 QT Interval:  377 QTC Calculation: 482 R Axis:   -86 Text Interpretation: Sinus rhythm Borderline prolonged PR interval Incomplete RBBB and LAFB Consider anterior infarct Abnormal T, consider ischemia, lateral leads No old tracing to compare Confirmed by Rolan Bucco [phone]) on 07/21/2021 12:39:17 AM  Radiology DG Chest 2 View  Result Date: 07/21/2021 CLINICAL DATA:  Dyspnea EXAM: CHEST - 2 VIEW COMPARISON:  None. FINDINGS: Lungs are well expanded, symmetric, and clear. No pneumothorax or pleural effusion. Mild cardiomegaly with left atrial enlargement. Mitral valve replacement has been performed. Pulmonary vascularity is normal. Osseous structures are age-appropriate. No acute bone abnormality. IMPRESSION: No radiographic evidence of acute cardiopulmonary disease. Mild cardiomegaly with left atrial enlargement. Electronically Signed   By: Helyn Numbers M.D.   On: 07/21/2021 00:46    Procedures Procedures   Medications Ordered in ED Medications  alum & mag hydroxide-simeth (MAALOX/MYLANTA) 200-200-20 MG/5ML suspension 30 mL (30 mLs Oral Given 07/21/21 0124)    ED Course  I have reviewed the triage  vital signs and the nursing notes.  Pertinent labs & imaging results that were available during my care of the patient were reviewed by me and considered in my medical decision making (see chart for details).    MDM Rules/Calculators/A&P                         Patient is a 50 year old male who presents with episode of shortness of breath after vomiting.  Sounds like he is having some reflux type symptoms.  He has no associated chest pain.  No ongoing shortness of breath.  Currently he is asymptomatic.  He did get a dose of GI cocktail and was feeling better after this with less burping.  He has no ongoing shortness of breath.  Chest x-ray is clear without evidence of pneumonia or pneumothorax.  This was interpreted by me.  No symptoms that sound more concerning for ACS.  His EKG is nonconcerning.  His labs show mildly low potassium but otherwise nonconcerning.  He was discharged home in good condition.  He was given dose of potassium replacement.  He was encouraged to follow-up with his PCP.  Return precautions were given.    Final Clinical Impression(s) / ED Diagnoses Final diagnoses:  SOB (shortness of breath)  Gastroesophageal reflux disease, unspecified whether esophagitis present    Rx / DC Orders ED Discharge Orders     None        Rolan Bucco, MD 07/21/21 [phone]

## 2021-07-21 NOTE — Discharge Instructions (Signed)
Follow-up with your primary care doctor.  Return here as needed if you have any worsening symptoms. 

## 2021-07-21 NOTE — ED Triage Notes (Signed)
PER EMS: pt from home with initial c/o shortness of breath, stating he felt like food was stuck in his throat. He also had near syncopal episodes with EMS en route but they state he was A&Ox4 during these episodes.   BP 146/112 HR-94 RR-18

## 2021-08-10 ENCOUNTER — Ambulatory Visit: Payer: Medicaid Other | Admitting: Cardiology

## 2021-08-26 ENCOUNTER — Ambulatory Visit: Payer: Medicaid Other | Admitting: Cardiology

## 2021-09-06 ENCOUNTER — Ambulatory Visit: Payer: Medicaid Other | Admitting: Internal Medicine

## 2021-09-06 NOTE — Progress Notes (Signed)
Date:  09/09/2021   ID:  John Price, DOB [birth year], MRN [id number]  PCP:  Mack Hook, MD  Cardiologist:  Rex Kras, DO, [facility]  (established care 02/28/2021) Former Cardiology Providers: Lynne Leader ([place])  Date: 09/09/21 Last Office Visit: 04/05/2021  Chief Complaint  Patient presents with   Follow-up    4 month   heart failure management   HPI  John Price is a 51 y.o. 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  He was referred to the office at the request of Mack Hook, MD for evaluation of reestablish care and complex cardiac history.  Patient was previously established with cardiologist in [place].  Very limited record review showed he had hemorrhagic stroke in 2018 with left-sided residual deficits due to uncontrolled hypertension.  In 2019 he underwent CABG with mitral valve replacement per patient with bioprosthetic mitral valve, and no additional information available.  When patient establish care with our office echocardiogram noted mildly reduced LVEF and normal functioning bioprosthetic mitral valve.  He has been following closely for up titration and management of guideline directed medical therapy, however this has been limited due to renal function.  Patient was evaluated in the ED 07/21/2021 with complaints of shortness of breath.  At that time chest x-ray showed mild cardiomegaly, BMP revealed hypokalemia, and CBC was unremarkable.  ED providers felt  shortness of breath was likely related to GERD, therefore potassium was replaced and he was discharged home.  Patient now presents to our office for follow-up and 38-month office visit.  Patient's primary complaint today is tenderness to palpation along his sternotomy scar.  He denies chest pain at rest or with exertion.  Reports shortness of breath remains stable.  Of note patient's blood pressure is elevated in the office today, however he reports he has not taken any of his medications yet this morning.  Given history of GERD and vertigo inquired about patient's PCP follow-up, he reports he is currently seeing 2 different PCPs at this time.  I will request records from both.  He also follows with nephrology, will therefore obtain records from [facility] as well.  FUNCTIONAL STATUS: No structured exercise program or daily routine.   ALLERGIES: Allergies  Allergen Reactions   Gabapentin Anaphylaxis    MEDICATION LIST PRIOR TO VISIT: Current Meds  Medication Sig   albuterol (VENTOLIN HFA) 108 (90 Base) MCG/ACT inhaler 2 puffs every 6 hours as needed for wheezing   amLODipine (NORVASC) 5 MG tablet TAKE 1 TABLET(5 MG) BY MOUTH EVERY MORNING   aspirin 81 MG chewable tablet 1 tab by mouth daily with morning meal   atorvastatin (LIPITOR) 40 MG tablet 1 tab by mouth with evening meal.   ATROVENT HFA 17 MCG/ACT inhaler Inhale 1 puff into the lungs.   carvedilol (COREG) 25 MG tablet TAKE 1 TABLET(25 MG) BY MOUTH TWICE DAILY WITH A MEAL   ENTRESTO 49-51 MG 1 tab by mouth twice daily with meals   furosemide (LASIX) 40 MG tablet 1 tab by mouth with morning meal   isosorbide mononitrate (IMDUR) 60 MG 24 hr tablet TAKE 1  TABLET(60 MG) BY MOUTH DAILY   loratadine (CLARITIN) 10 MG tablet 1 tab by mouth every morning for allergies   potassium chloride SA (KLOR-CON) 20 MEQ tablet Take 20 mEq by mouth daily.   tiotropium (SPIRIVA HANDIHALER) 18 MCG inhalation capsule Place 1 capsule (18 mcg  total) into inhaler and inhale daily.   Vitamin D, Ergocalciferol, 50000 units CAPS Take 1 capsule by mouth once a week. On Fridays with evening meal     PAST MEDICAL HISTORY: Past Medical History:  Diagnosis Date   Asthma    CHF (congestive heart failure) (HCC)    Chronic kidney disease    Coronary artery disease    Hyperlipidemia    Hypertension    Primary hypertension 12/28/2020   Stroke [facility])     PAST SURGICAL HISTORY: Past Surgical History:  Procedure Laterality Date   ANKLE ARTHODESIS W/ ARTHROSCOPY Left    APPENDECTOMY     CARDIAC VALVE REPLACEMENT     CORONARY ARTERY BYPASS GRAFT  2019    FAMILY HISTORY: The patient family history includes Early death in his sister; Epilepsy in his sister; Hypertension in his brother, brother, and mother; Kidney disease in his mother; Seizures in his sister.  SOCIAL HISTORY:  The patient  reports that he quit smoking about 3 years ago. His smoking use included cigarettes. He has a 3.00 pack-year smoking history. He has never used smokeless tobacco. He reports current alcohol use. He reports that he does not use drugs.  REVIEW OF SYSTEMS: Review of Systems  Eyes:  Negative for discharge, double vision and pain.  Cardiovascular:  Negative for chest pain, claudication, dyspnea on exertion, leg swelling, near-syncope, orthopnea, palpitations, paroxysmal nocturnal dyspnea and syncope.  Respiratory:  Positive for shortness of breath (chronic, stable). Negative for hemoptysis.   Musculoskeletal:  Negative for muscle cramps and myalgias.  Gastrointestinal:  Negative for hematemesis, hematochezia and melena.  Neurological:  Negative for light-headedness.   PHYSICAL EXAM: Vitals with BMI 09/08/2021 07/21/2021 07/21/2021  Height 5\' 6"  - -  Weight 269 lbs - -  BMI 99991111 - -  Systolic 123456 [id number]  Diastolic 86 88 89  Pulse 74 85 78    CONSTITUTIONAL: Appears older than stated age, hemodynamically stable, no acute distress, ambulates with a  cane.  SKIN: Skin is warm and dry. No rash noted. No cyanosis. No pallor. No jaundice HEAD: Normocephalic and atraumatic.  EYES: No scleral icterus.  Arcus senilis MOUTH/THROAT: Moist oral membranes.  NECK: No JVD present. No thyromegaly noted. LYMPHATIC: No visible cervical adenopathy.  CHEST Normal respiratory effort. No intercostal retractions.  Sternotomy site is clean dry and intact LUNGS: Clear to auscultation bilaterally.  No stridor. No wheezes. No rales.  CARDIOVASCULAR: Regular rate and rhythm, positive Q000111Q, soft holosystolic murmur heard at the apex, no gallops or rubs. ABDOMINAL: Obese, soft, nontender, nondistended, positive bowel sounds in all 4 quadrants, no apparent ascites.  EXTREMITIES: No peripheral edema, 2+ posterior tibial pulses bilaterally. HEMATOLOGIC: No significant bruising NEUROLOGIC: Oriented to person, place, and time.  5+ right upper and lower extremity strength, 3+ left upper and lower extremity strength. Normal muscle tone.  PSYCHIATRIC: Normal mood and affect. Normal behavior. Cooperative  Physical exam unchanged compared to previous office visit.  CARDIAC DATABASE: EKG: 02/28/2021: Normal sinus rhythm, 72 bpm, left axis, left anterior fascicular block, poor R wave progression, nonspecific ST-T changes in the high lateral leads cannot rule out ischemia.  Without underlying injury pattern.  No prior ECGs available for comparison.  09/08/2021: Sinus rhythm with 2 PVCs at a rate of 72 bpm.  Left axis, left anterior fascicular block.  Incomplete right bundle branch block.  Poor R wave progression, cannot exclude anteroseptal infarct old.  Unspecific ST-T changes, cannot exclude lateral ischemia.  Unchanged compared to previous EKG on 02/28/2021.  Echocardiogram: 11/2019 [facility]: LVEF 35%, moderate/severe concentric hypertrophy, moderately reduced global left ventricular systolic function, bioprosthetic mitral valve well-seated, normal position.  Mild TR.   Mild LAE.  03/11/2021: LVEF 45-50%, global hypokinesis, moderate LVH, unable to comment on diastolic G due to mitral valve replacement, RV systolic function reduced (RV S' 7cm/sec and TAPSE 1.47cm), RV size mildly enlarged, normal PASP, mild LAE, bioprosthetic mitral valve in situ, well-seated, normal structure and function.  RAP 8 mmHg.    LABORATORY DATA: CBC Latest Ref Rng & Units 07/21/2021 01/03/2021 12/28/2020  WBC 4.0 - 10.5 K/uL 6.8 8.0 6.1  Hemoglobin 13.0 - 17.0 g/dL 13.6 14.5 14.8  Hematocrit 39.0 - 52.0 % 38.4(L) 41.9 43.6  Platelets 150 - 400 K/uL 271 274 258    CMP Latest Ref Rng & Units 07/21/2021 01/03/2021 12/28/2020  Glucose 70 - 99 mg/dL 119(H) 120(H) 112(H)  BUN 6 - 20 mg/dL 28(H) 36(H) 23(H)  Creatinine 0.61 - 1.24 mg/dL 2.41(H) 2.48(H) 2.43(H)  Sodium 135 - 145 mmol/L 139 138 139  Potassium 3.5 - 5.1 mmol/L 3.0(L) 3.2(L) 3.3(L)  Chloride 98 - 111 mmol/L 104 97(L) 97(L)  CO2 22 - 32 mmol/L 28 31 31   Calcium 8.9 - 10.3 mg/dL 8.9 9.5 9.7  Total Protein 6.5 - 8.1 g/dL - - 7.8  Total Bilirubin 0.3 - 1.2 mg/dL - - 1.1  Alkaline Phos 38 - 126 U/L - - 65  AST 15 - 41 U/L - - 36  ALT 0 - 44 U/L - - 69(H)    Lipid Panel  No results found for: CHOL, TRIG, HDL, CHOLHDL, VLDL, LDLCALC, LDLDIRECT, LABVLDL  No components found for: NTPROBNP No results for input(s): PROBNP in the last 8760 hours. Recent Labs    12/28/20 1715  TSH 1.427    BMP Recent Labs    12/28/20 1715 01/03/21 2340 07/21/21 0121  NA 139 138 139  K 3.3* 3.2* 3.0*  CL 97* 97* 104  CO2 31 31 28   GLUCOSE 112* 120* 119*  BUN 23* 36* 28*  CREATININE 2.43* 2.48* 2.41*  CALCIUM 9.7 9.5 8.9  GFRNONAA 32* 31* 32*    HEMOGLOBIN A1C Lab Results  Component Value Date   HGBA1C 5.2 05/02/2021   External labs: 07/04/2021: BUN 29, creatinine 2.31, GFR 34, sodium 141, potassium 3.8 Magnesium 2.0 Hgb 13.3  08/15/2021: Hgb 14.4, HCT 40.6, MCV 94, platelet 288 Sodium 142, potassium 3.5, BUN 19,  creatinine 2.2, GFR 32 Magnesium 1.88  IMPRESSION:    ICD-10-CM   1. Chronic combined systolic and diastolic heart failure (HCC)  I50.42 EKG 12-Lead    2. Atherosclerosis of native coronary artery of native heart without angina pectoris  I25.10     3. Hx of CABG  Z95.1     4. History of mitral valve replacement with bioprosthetic valve  Z95.3 EKG 12-Lead    5. Benign hypertension with chronic kidney disease, stage III (HCC)  I12.9 EKG 12-Lead   N18.30       RECOMMENDATIONS: ANKUSH WIDRIG is a 51 y.o. 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  I have individually extensively reviewed records from external providers including and nephrology records.  I reviewed both provider notes and laboratory results.  Notably patient's PCP has recently sent referrals to neurology for evaluation of sleep apnea as well as vertigo, to gastroenterology for management of GERD, and to psychiatry for evaluation and management of depression.  Chronic HFrEF, stage C, NYHA class II: There is no clinical evidence of acute decompensated heart failure at today's office visit. Echocardiogram 02/2021 reveals mildly reduced LVEF with normal functioning bioprosthetic mitral valve. I personally reviewed external labs from nephrology and PCP, details above. Given patient's renal dysfunction uptitration of guideline directed medical therapy has been limited.  Patient was recently evaluated by nephrology in 06/2021 who recommended continuing Entresto 49/51 mg p.o. twice daily and holding off on initiation of Farxiga. Patient reports medication  compliance, however he does appear confused when answering questions about specific medications, therefore I am concerned about compliance issues. Advised patient of the importance of low-sodium diet Encourage patient to keep/follow-up on referrals sent by PCP, particularly regarding the importance of sleep evaluation. Continue carvedilol, Entresto, Imdur, and furosemide  Coronary artery disease status post CABG: CABG surgery in 2019, per patient.  However we have been unable to obtain prior records for review.  Patient unfortunately knows very little about his bypass history, he is unaware of how many bypasses or who did the surgery and where. Continue aspirin and statin therapy.  Hypertension: Patient's blood pressure is elevated in the office today, however he has not taken any of his medications today.  We will therefore hold off on making changes to medications at this time, however could consider addition of hydralazine if additional antihypertensive medications are needed Continue amlodipine, carvedilol, Entresto, Imdur  Hyperlipidemia: Continue atorvastatin Patient has not done patient has not had lipid profile testing done as ordered at last office visit, advised him to do so.  Stage 3b CKD:  Follows with nephrology Uptitration of guideline directed medical therapy for heart failure has been limited due to renal dysfunction, will continue to follow closely.  S/p bioprosthetic mitral valve: Echocardiogram 02/2021 notes bioprosthetic mitral valve to be functioning appropriately Will continue to monitor.   FINAL MEDICATION LIST END OF ENCOUNTER: Patient did not bring his medication bottles or list to accurately reconcile his outpatient medications during today's encounter.    Current Outpatient Medications:    albuterol (VENTOLIN HFA) 108 (90 Base) MCG/ACT inhaler, 2 puffs every 6 hours as needed for wheezing, Disp: 18 g, Rfl: 1   amLODipine (NORVASC) 5 MG tablet, TAKE 1 TABLET(5  MG) BY MOUTH EVERY MORNING, Disp: 90 tablet, Rfl: 2   aspirin 81 MG chewable tablet, 1 tab by mouth daily with morning meal, Disp: 30 tablet, Rfl: 11   atorvastatin (LIPITOR) 40 MG tablet, 1 tab by mouth with evening meal., Disp: 30 tablet, Rfl: 11   ATROVENT HFA 17 MCG/ACT inhaler, Inhale 1 puff into the lungs., Disp: , Rfl:    carvedilol (COREG) 25 MG tablet, TAKE 1 TABLET(25 MG) BY MOUTH TWICE DAILY WITH A MEAL, Disp: 180 tablet, Rfl: 0   ENTRESTO 49-51 MG, 1 tab by mouth twice daily with meals, Disp: 60 tablet, Rfl: 11   furosemide (LASIX) 40 MG tablet, 1 tab by mouth with morning meal, Disp: 30 tablet, Rfl: 11  isosorbide mononitrate (IMDUR) 60 MG 24 hr tablet, TAKE 1 TABLET(60 MG) BY MOUTH DAILY, Disp: 90 tablet, Rfl: 1   loratadine (CLARITIN) 10 MG tablet, 1 tab by mouth every morning for allergies, Disp: 30 tablet, Rfl: 11   potassium chloride SA (KLOR-CON) 20 MEQ tablet, Take 20 mEq by mouth daily., Disp: , Rfl:    tiotropium (SPIRIVA HANDIHALER) 18 MCG inhalation capsule, Place 1 capsule (18 mcg total) into inhaler and inhale daily., Disp: 30 capsule, Rfl: 11   Vitamin D, Ergocalciferol, 50000 units CAPS, Take 1 capsule by mouth once a week. On Fridays with evening meal, Disp: 4 capsule, Rfl: 11  Orders Placed This Encounter  Procedures   EKG 12-Lead    There are no Patient Instructions on file for this visit.   --Continue cardiac medications as reconciled in final medication list. --Return in about 3 months (around 12/06/2021) for HFrEF, HTN, vavle dz. Or sooner if needed. --Continue follow-up with your primary care physician regarding the management of your other chronic comorbid conditions.  Patient's questions and concerns were addressed to his satisfaction. He voices understanding of the instructions provided during this encounter.   This note was created using a voice recognition software as a result there may be grammatical errors inadvertently enclosed that do not reflect  the nature of this encounter. Every attempt is made to correct such errors.   This was a 50-minute encounter with face-to-face counseling, medical records review, coordination of care, explanation of complex medical issues, complex medical decision making.     Alethia Berthold, PA-C 09/09/2021, 1:45 PM Office: [phone]

## 2021-09-08 ENCOUNTER — Ambulatory Visit: Payer: Medicaid Other | Admitting: Student

## 2021-09-08 ENCOUNTER — Encounter: Payer: Self-pay | Admitting: Student

## 2021-09-08 ENCOUNTER — Other Ambulatory Visit: Payer: Self-pay

## 2021-09-08 VITALS — BP 148/86 | HR 74 | Temp 98.4°F | Resp 17 | Ht 66.0 in | Wt 269.0 lb

## 2021-09-08 DIAGNOSIS — I2510 Atherosclerotic heart disease of native coronary artery without angina pectoris: Secondary | ICD-10-CM

## 2021-09-08 DIAGNOSIS — N183 Chronic kidney disease, stage 3 unspecified: Secondary | ICD-10-CM

## 2021-09-08 DIAGNOSIS — Z951 Presence of aortocoronary bypass graft: Secondary | ICD-10-CM

## 2021-09-08 DIAGNOSIS — I129 Hypertensive chronic kidney disease with stage 1 through stage 4 chronic kidney disease, or unspecified chronic kidney disease: Secondary | ICD-10-CM

## 2021-09-08 DIAGNOSIS — Z953 Presence of xenogenic heart valve: Secondary | ICD-10-CM

## 2021-09-08 DIAGNOSIS — I5042 Chronic combined systolic (congestive) and diastolic (congestive) heart failure: Secondary | ICD-10-CM

## 2021-10-23 ENCOUNTER — Emergency Department (HOSPITAL_COMMUNITY)
Admission: EM | Admit: 2021-10-23 | Discharge: 2021-10-23 | Disposition: A | Discharge status: Home / Self Care | Payer: Medicaid Other | Attending: Emergency Medicine | Admitting: Emergency Medicine

## 2021-10-23 ENCOUNTER — Other Ambulatory Visit: Payer: Self-pay

## 2021-10-23 ENCOUNTER — Emergency Department (HOSPITAL_COMMUNITY): Payer: Medicaid Other

## 2021-10-23 DIAGNOSIS — R0981 Nasal congestion: Secondary | ICD-10-CM | POA: Diagnosis not present

## 2021-10-23 DIAGNOSIS — I2510 Atherosclerotic heart disease of native coronary artery without angina pectoris: Secondary | ICD-10-CM | POA: Insufficient documentation

## 2021-10-23 DIAGNOSIS — R059 Cough, unspecified: Secondary | ICD-10-CM | POA: Insufficient documentation

## 2021-10-23 DIAGNOSIS — Z79899 Other long term (current) drug therapy: Secondary | ICD-10-CM | POA: Diagnosis not present

## 2021-10-23 DIAGNOSIS — Z7982 Long term (current) use of aspirin: Secondary | ICD-10-CM | POA: Insufficient documentation

## 2021-10-23 DIAGNOSIS — I509 Heart failure, unspecified: Secondary | ICD-10-CM | POA: Diagnosis not present

## 2021-10-23 DIAGNOSIS — I110 Hypertensive heart disease with heart failure: Secondary | ICD-10-CM | POA: Insufficient documentation

## 2021-10-23 MED ORDER — ALUM & MAG HYDROXIDE-SIMETH 200-200-20 MG/5ML PO SUSP
30.0000 mL | Freq: Once | ORAL | Status: AC
  Administered 2021-10-23: 30 mL via ORAL
  Filled 2021-10-23: qty 30

## 2021-10-23 MED ORDER — LIDOCAINE VISCOUS HCL 2 % MT SOLN
15.0000 mL | Freq: Once | OROMUCOSAL | Status: AC
  Administered 2021-10-23: 15 mL via ORAL
  Filled 2021-10-23: qty 15

## 2021-10-23 MED ORDER — GUAIFENESIN ER 600 MG PO TB12: 600.0000 mg | ORAL_TABLET | Freq: Two times a day (BID) | ORAL | 0 refills | Status: DC

## 2021-10-23 MED ORDER — MAALOX MAX 400-400-40 MG/5ML PO SUSP: 5.0000 mL | Freq: Four times a day (QID) | ORAL | 0 refills | Status: DC | PRN

## 2021-10-23 NOTE — ED Triage Notes (Signed)
BIB [facility] after pt family called to report pt coughing and unable to clear secretions. Per EMS, pt feels as though they have something "stuck in throat." No n/v at this time. ? ?HX: stroke with left sided deficits, chronic vertigo ?

## 2021-10-23 NOTE — ED Provider Notes (Signed)
?[facility] EMERGENCY DEPARTMENT ?Provider Note ? ? ?CSN: [id number] ?Arrival date & time: 10/23/21  1104 ? ?  ? ?History ? ?Chief Complaint  ?Patient presents with  ? Cough  ? ? ?John Price is a 51 y.o. male. ? ?HPI ? ?51 year old male with past medical history of HTN, HLD, CAD/CHF presents emergency department with concern for cough/congestion.  Patient states has been going on for the past couple days.  He feels as if the phlegm is stuck in his throat and he cannot clear it.  This has made him have an upset stomach with how hard he has tried to clear the secretions.  He has not been trying any over-the-counter medication.  Denies any chest pain, shortness of breath, back pain.  No recent fever.  No swelling of his lower extremities. ? ?Home Medications ?Prior to Admission medications   ?Medication Sig Start Date End Date Taking? Authorizing Provider  ?albuterol (VENTOLIN HFA) 108 (90 Base) MCG/ACT inhaler 2 puffs every 6 hours as needed for wheezing 05/02/21   Julieanne Manson, MD  ?amLODipine (NORVASC) 5 MG tablet TAKE 1 TABLET(5 MG) BY MOUTH EVERY MORNING 06/06/21   Tolia, Sunit, DO  ?aspirin 81 MG chewable tablet 1 tab by mouth daily with morning meal 05/02/21   Julieanne Manson, MD  ?atorvastatin (LIPITOR) 40 MG tablet 1 tab by mouth with evening meal. 05/02/21   Julieanne Manson, MD  ?ATROVENT HFA 17 MCG/ACT inhaler Inhale 1 puff into the lungs. 06/10/21   [provider]  ?carvedilol (COREG) 25 MG tablet TAKE 1 TABLET(25 MG) BY MOUTH TWICE DAILY WITH A MEAL 06/14/21   Tolia, Sunit, DO  ?ENTRESTO 49-51 MG 1 tab by mouth twice daily with meals 05/02/21   Julieanne Manson, MD  ?furosemide (LASIX) 40 MG tablet 1 tab by mouth with morning meal 05/02/21   Julieanne Manson, MD  ?isosorbide mononitrate (IMDUR) 60 MG 24 hr tablet TAKE 1 TABLET(60 MG) BY MOUTH DAILY 06/06/21   Tolia, Sunit, DO  ?loratadine (CLARITIN) 10 MG tablet 1 tab by mouth every morning for allergies  05/02/21   Julieanne Manson, MD  ?potassium chloride SA (KLOR-CON) 20 MEQ tablet Take 20 mEq by mouth daily. 03/31/21   [provider]  ?tiotropium (SPIRIVA HANDIHALER) 18 MCG inhalation capsule Place 1 capsule (18 mcg total) into inhaler and inhale daily. 05/02/21   Julieanne Manson, MD  ?Vitamin D, Ergocalciferol, 50000 units CAPS Take 1 capsule by mouth once a week. On Fridays with evening meal 05/02/21   Julieanne Manson, MD  ?   ? ?Allergies    ?Gabapentin   ? ?Review of Systems   ?Review of Systems  ?Constitutional:  Negative for fever.  ?HENT:  Positive for congestion.   ?Respiratory:  Positive for cough. Negative for shortness of breath.   ?Cardiovascular:  Negative for chest pain.  ?Gastrointestinal:  Negative for abdominal pain, diarrhea and vomiting.  ?Musculoskeletal:  Negative for back pain.  ?Skin:  Negative for rash.  ?Neurological:  Negative for headaches.  ? ?Physical Exam ?Updated Vital Signs ?BP 134/89   Pulse 71   Temp 98.8 ?F (37.1 ?C) (Oral)   Resp 15   SpO2 96%  ?Physical Exam ?Vitals and nursing note reviewed.  ?Constitutional:   ?   General: He is not in acute distress. ?   Appearance: Normal appearance. He is not diaphoretic.  ?HENT:  ?   Head: Normocephalic.  ?   Mouth/Throat:  ?   Mouth: Mucous membranes are  moist.  ?Cardiovascular:  ?   Rate and Rhythm: Normal rate.  ?Pulmonary:  ?   Effort: Pulmonary effort is normal. No respiratory distress.  ?   Breath sounds: No rales.  ?Abdominal:  ?   Palpations: Abdomen is soft.  ?   Tenderness: There is no abdominal tenderness.  ?Skin: ?   General: Skin is warm.  ?Neurological:  ?   Mental Status: He is alert and oriented to person, place, and time. Mental status is at baseline.  ?Psychiatric:     ?   Mood and Affect: Mood normal.  ? ? ?ED Results / Procedures / Treatments   ?Labs ?(all labs ordered are listed, but only abnormal results are displayed) ?Labs Reviewed - No data to display ? ?EKG ?None ? ?Radiology ?DG Chest Port 1  View ? ?Result Date: 10/23/2021 ?CLINICAL DATA:  Cough EXAM: PORTABLE CHEST 1 VIEW COMPARISON:  07/21/2021 FINDINGS: Stable cardiomegaly status post CABG. No focal airspace consolidation, pleural effusion, or pneumothorax. IMPRESSION: No active disease. Electronically Signed   By: Duanne Guess D.O.   On: 10/23/2021 15:10   ? ?Procedures ?Procedures  ? ? ?Medications Ordered in ED ?Medications  ?alum & mag hydroxide-simeth (MAALOX/MYLANTA) 200-200-20 MG/5ML suspension 30 mL (has no administration in time range)  ?  And  ?lidocaine (XYLOCAINE) 2 % viscous mouth solution 15 mL (has no administration in time range)  ? ? ?ED Course/ Medical Decision Making/ A&P ?  ?                        ?Medical Decision Making ?Amount and/or Complexity of Data Reviewed ?Radiology: ordered. ? ?Risk ?OTC drugs. ?Prescription drug management. ? ? ?51 year old male presents emergency department with congestion, cough and the sensation that phlegm is stuck in his throat.  He states the phlegm getting stuck in his throat has given him an upset stomach.  No difficulty with eating or drinking.  He has not taken any over-the-counter medication to aid in his symptoms.  No recent fever.  No active chest pain, shortness of breath or swelling of his lower extremities. ? ?Chest x-ray shows no acute abnormalities.  A GI cocktail helped with his reflux-like symptoms. ? ?On reevaluation patient is feeling better.  We will plan for symptomatic treatment of increased mucus production.  No signs of acute illness, pneumonia or other emergent findings.  Patient at this time appears safe and stable for discharge and close outpatient follow up. Discharge plan and strict return to ED precautions discussed, patient verbalizes understanding and agreement. ? ? ? ? ? ? ? ?Final Clinical Impression(s) / ED Diagnoses ?Final diagnoses:  ?None  ? ? ?Rx / DC Orders ?ED Discharge Orders   ? ? None  ? ?  ? ? ?  ?Rozelle Logan, DO ?10/23/21 1741 ? ?

## 2021-10-23 NOTE — Discharge Instructions (Signed)
You have been seen and discharged from the emergency department.  Your chest x-ray showed no finding of infection/acute abnormality.  You are most likely suffering from increased phlegm production.  Take medication to thin the mucus as well as other medication to help control reflux-like symptoms.  Follow-up with your primary provider for further evaluation and further care. Take home medications as prescribed. If you have any worsening symptoms or further concerns for your health please return to an emergency department for further evaluation. ?

## 2021-10-23 NOTE — ED Notes (Signed)
Pt ambulated to bathroom independently

## 2021-10-23 NOTE — ED Notes (Signed)
Pt reports he has not taken his BP medication today.

## 2021-11-29 ENCOUNTER — Other Ambulatory Visit: Payer: Self-pay | Admitting: Cardiology

## 2021-11-30 ENCOUNTER — Encounter: Payer: Medicaid Other | Admitting: Internal Medicine

## 2021-11-30 DIAGNOSIS — K219 Gastro-esophageal reflux disease without esophagitis: Secondary | ICD-10-CM | POA: Insufficient documentation

## 2021-11-30 DIAGNOSIS — N189 Chronic kidney disease, unspecified: Secondary | ICD-10-CM | POA: Insufficient documentation

## 2021-11-30 DIAGNOSIS — I429 Cardiomyopathy, unspecified: Secondary | ICD-10-CM | POA: Insufficient documentation

## 2021-11-30 DIAGNOSIS — I5042 Chronic combined systolic (congestive) and diastolic (congestive) heart failure: Secondary | ICD-10-CM | POA: Insufficient documentation

## 2021-12-08 ENCOUNTER — Ambulatory Visit: Payer: Medicaid Other | Admitting: Cardiology

## 2021-12-20 ENCOUNTER — Ambulatory Visit (HOSPITAL_COMMUNITY): Payer: Medicaid Other | Admitting: Licensed Clinical Social Worker

## 2021-12-22 ENCOUNTER — Encounter: Payer: Self-pay | Admitting: Cardiology

## 2021-12-22 ENCOUNTER — Ambulatory Visit: Payer: Medicaid Other | Admitting: Cardiology

## 2021-12-22 VITALS — BP 144/87 | HR 72 | Temp 98.6°F | Resp 16 | Ht 66.0 in | Wt 265.6 lb

## 2021-12-22 DIAGNOSIS — I129 Hypertensive chronic kidney disease with stage 1 through stage 4 chronic kidney disease, or unspecified chronic kidney disease: Secondary | ICD-10-CM

## 2021-12-22 DIAGNOSIS — I2510 Atherosclerotic heart disease of native coronary artery without angina pectoris: Secondary | ICD-10-CM

## 2021-12-22 DIAGNOSIS — I5022 Chronic systolic (congestive) heart failure: Secondary | ICD-10-CM

## 2021-12-22 DIAGNOSIS — Z953 Presence of xenogenic heart valve: Secondary | ICD-10-CM

## 2021-12-22 DIAGNOSIS — Z951 Presence of aortocoronary bypass graft: Secondary | ICD-10-CM

## 2021-12-22 DIAGNOSIS — E782 Mixed hyperlipidemia: Secondary | ICD-10-CM

## 2021-12-22 DIAGNOSIS — I69359 Hemiplegia and hemiparesis following cerebral infarction affecting unspecified side: Secondary | ICD-10-CM

## 2021-12-22 NOTE — Progress Notes (Signed)
Date:  12/22/2021   ID:  John Price, DOB Mar 03, 1971, MRN [id number]  PCP:  Mack Hook, MD  Cardiologist:  Rex Kras, DO, [facility]  (established care 02/28/2021) Nephrologist: Dr. Harrie Jeans Former Cardiology Providers: Lynne Leader ([place])  Date: 12/22/21 Last Office Visit: 09/08/2021.  Chief Complaint  Patient presents with   heart failure management    Hypertension   vavle dz   Follow-up   HPI  John Price is a 51 y.o. 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  In 2022 patient relocated from [place] to [place] and was referred to the practice given his history of CAD/CABG and mitral valve replacement.  Patient is a poor historian and based on limited records available he had a hemorrhagic stroke in 2018 resulted in left-sided residual weakness.  The stroke was likely secondary to uncontrolled hypertension.  Following year in 2019 he underwent CABG and mitral valve replacement per patient.  Surgical anatomy is unknown and based on the recent echocardiogram he has a bioprosthetic mitral valve.  Since last office visit patient denies any anginal discomfort and shortness of breath remains chronic and stable.  He has not been hospitalized for heart failure exacerbation.  He denies orthopnea, paroxysmal nocturnal dyspnea or lower extremity swelling.  FUNCTIONAL STATUS: No structured exercise program or daily routine.   ALLERGIES: Allergies  Allergen Reactions   Gabapentin Anaphylaxis    MEDICATION LIST PRIOR TO VISIT: Current Meds  Medication Sig   albuterol  (VENTOLIN HFA) 108 (90 Base) MCG/ACT inhaler 2 puffs every 6 hours as needed for wheezing   alum & mag hydroxide-simeth (MAALOX MAX) 400-400-40 MG/5ML suspension Take 5 mLs by mouth every 6 (six) hours as needed for indigestion.   amLODipine (NORVASC) 5 MG tablet TAKE 1 TABLET(5 MG) BY MOUTH EVERY MORNING   aspirin 81 MG chewable tablet 1 tab by mouth daily with morning meal   atorvastatin (LIPITOR) 40 MG tablet 1 tab by mouth with evening meal.   ATROVENT HFA 17 MCG/ACT inhaler Inhale 1 puff into the lungs.   carvedilol (COREG) 25 MG tablet TAKE 1 TABLET(25 MG) BY MOUTH TWICE DAILY WITH A MEAL   ENTRESTO 49-51 MG 1 tab by mouth twice daily with meals   famotidine (PEPCID) 40 MG tablet Take 40 mg by mouth at bedtime.   furosemide (LASIX) 40 MG tablet 1 tab by mouth with morning meal   guaiFENesin (MUCINEX) 600 MG 12 hr tablet Take 1 tablet (600 mg total) by mouth 2 (two) times daily.   isosorbide mononitrate (IMDUR) 60 MG 24 hr tablet TAKE 1 TABLET(60 MG) BY MOUTH DAILY   loratadine (CLARITIN) 10 MG tablet 1 tab by mouth every morning for allergies   NON FORMULARY Take 1 tablet by mouth daily in the afternoon. Vertigo nausea gummies   NON FORMULARY Take 1 tablet by mouth daily at 12 noon. Claritox Pro   potassium chloride SA (KLOR-CON) 20 MEQ tablet Take 20 mEq by mouth daily.   Vitamin D, Ergocalciferol, 50000 units CAPS Take 1 capsule by mouth once a week. On Fridays with evening meal     PAST MEDICAL HISTORY: Past Medical History:  Diagnosis Date   Asthma  CHF (congestive heart failure) (HCC)    Chronic kidney disease    Coronary artery disease    Hyperlipidemia    Hypertension    Primary hypertension 12/28/2020   Stroke [facility])     PAST SURGICAL HISTORY: Past Surgical History:  Procedure Laterality Date   ANKLE ARTHODESIS W/ ARTHROSCOPY Left    APPENDECTOMY     CARDIAC VALVE REPLACEMENT     CORONARY ARTERY BYPASS GRAFT  2019    FAMILY HISTORY: The patient family history  includes Early death in his sister; Epilepsy in his sister; Hypertension in his brother, brother, and mother; Kidney disease in his mother; Seizures in his sister.  SOCIAL HISTORY:  The patient  reports that he quit smoking about 4 years ago. His smoking use included cigarettes. He has a 3.00 pack-year smoking history. He has never used smokeless tobacco. He reports current alcohol use. He reports that he does not use drugs.  REVIEW OF SYSTEMS: Review of Systems  Constitutional: Positive for malaise/fatigue.  Cardiovascular:  Positive for dyspnea on exertion (Chronic and stable). Negative for chest pain, leg swelling, near-syncope, orthopnea, palpitations, paroxysmal nocturnal dyspnea and syncope.  Respiratory:  Positive for shortness of breath (chronic and stable).   Neurological:  Positive for dizziness and vertigo.   PHYSICAL EXAM:    12/22/2021   11:46 AM 10/23/2021    5:45 PM 10/23/2021    5:00 PM  Vitals with BMI  Height _0     Weight 265 lbs 10 oz    BMI 26.41    Systolic 583 094 076  Diastolic 87 81 808  Pulse 72 52 67    CONSTITUTIONAL: Appears older than stated age, hemodynamically stable, no acute distress, ambulates with a cane.  SKIN: Skin is warm and dry. No rash noted. No cyanosis. No pallor. No jaundice HEAD: Normocephalic and atraumatic.  EYES: No scleral icterus.  Arcus senilis MOUTH/THROAT: Moist oral membranes.  NECK: No JVD present. No thyromegaly noted. CHEST Normal respiratory effort. No intercostal retractions.  Sternotomy site is clean dry and intact LUNGS: Clear to auscultation bilaterally.  No stridor. No wheezes. No rales.  CARDIOVASCULAR: Regular rate and rhythm, positive U1-J0, soft holosystolic murmur heard at the apex, no gallops or rubs. ABDOMINAL: Obese, soft, nontender, nondistended, positive bowel sounds in all 4 quadrants, no apparent ascites.  EXTREMITIES: No peripheral edema, 2+ posterior tibial pulses bilaterally. HEMATOLOGIC: No  significant bruising NEUROLOGIC: Oriented to person, place, and time.  5+ right upper and lower extremity strength, 3+ left upper and lower extremity strength. Normal muscle tone.  PSYCHIATRIC: Normal mood and affect. Normal behavior. Cooperative  CARDIAC DATABASE: EKG: 02/28/2021: Normal sinus rhythm, 72 bpm, left axis, left anterior fascicular block, poor R wave progression, nonspecific ST-T changes in the high lateral leads cannot rule out ischemia.  Without underlying injury pattern.  No prior ECGs available for comparison.  09/08/2021: Normal sinus rhythm with sinus arrhythmia, 62 bpm, left axis, left anterior fascicular block.  Echocardiogram: 11/2019 [facility]: LVEF 35%, moderate/severe concentric hypertrophy, moderately reduced global left ventricular systolic function, bioprosthetic mitral valve well-seated, normal position.  Mild TR.  Mild LAE.  03/11/2021: LVEF 45-50%, global hypokinesis, moderate LVH, unable to comment on diastolic function due to mitral valve replacement, RV systolic function reduced (RV S' 7cm/sec and TAPSE 1.47cm), RV size mildly enlarged, normal PASP, mild LAE, bioprosthetic mitral valve in situ, well-seated, normal structure and function.  RAP 8 mmHg.    LABORATORY DATA:    Latest Ref Rng &  Units 07/21/2021    1:21 AM 01/03/2021   11:40 PM 12/28/2020    5:15 PM  CBC  WBC 4.0 - 10.5 K/uL 6.8   8.0   6.1    Hemoglobin 13.0 - 17.0 g/dL 13.6   14.5   14.8    Hematocrit 39.0 - 52.0 % 38.4   41.9   43.6    Platelets 150 - 400 K/uL 271   274   258         Latest Ref Rng & Units 07/21/2021    1:21 AM 01/03/2021   11:40 PM 12/28/2020    5:15 PM  CMP  Glucose 70 - 99 mg/dL 119   120   112    BUN 6 - 20 mg/dL 28   36   23    Creatinine 0.61 - 1.24 mg/dL 2.41   2.48   2.43    Sodium 135 - 145 mmol/L 139   138   139    Potassium 3.5 - 5.1 mmol/L 3.0   3.2   3.3    Chloride 98 - 111 mmol/L 104   97   97    CO2 22 - 32 mmol/L _0 Calcium 8.9 -  10.3 mg/dL 8.9   9.5   9.7    Total Protein 6.5 - 8.1 g/dL   7.8    Total Bilirubin 0.3 - 1.2 mg/dL   1.1    Alkaline Phos 38 - 126 U/L   65    AST 15 - 41 U/L   36    ALT 0 - 44 U/L   69      Lipid Panel  No results found for: CHOL, TRIG, HDL, CHOLHDL, VLDL, LDLCALC, LDLDIRECT, LABVLDL  No components found for: NTPROBNP No results for input(s): PROBNP in the last 8760 hours. Recent Labs    12/28/20 1715  TSH 1.427    BMP Recent Labs    12/28/20 1715 01/03/21 2340 07/21/21 0121  NA 139 138 139  K 3.3* 3.2* 3.0*  CL 97* 97* 104  CO2 _1 GLUCOSE 112* 120* 119*  BUN 23* 36* 28*  CREATININE 2.43* 2.48* 2.41*  CALCIUM 9.7 9.5 8.9  GFRNONAA 32* 31* 32*    HEMOGLOBIN A1C Lab Results  Component Value Date   HGBA1C 5.2 05/02/2021   External Labs: Collected: 12/14/2021 by nephrology. BUN 24, creatinine 2.2, EGFR 36. Sodium 141, potassium 3.7, chloride 102, bicarb 30. Magnesium 1.9. Hemoglobin 14.5 g/dL. Random protein to creatinine ratio 309 mg/g Cr with   IMPRESSION:    ICD-10-CM   1. Chronic systolic heart failure (HCC)  I50.22 PCV ECHOCARDIOGRAM COMPLETE    2. Hx of CABG  Z95.1 PCV ECHOCARDIOGRAM COMPLETE    3. Atherosclerosis of native coronary artery of native heart without angina pectoris  I25.10     4. History of mitral valve replacement with bioprosthetic valve  Z95.3 PCV ECHOCARDIOGRAM COMPLETE    5. Benign hypertension with chronic kidney disease, stage III (HCC)  I12.9    N18.30     6. Mixed hyperlipidemia  E78.2     7. History of hemorrhagic stroke with residual hemiparesis [facility])  I69.359        RECOMMENDATIONS: YEHUDA PRINTUP is a 51 y.o. 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  Chronic systolic heart failure (HCC) Euvolemic. No recent hospitalizations for congestive heart failure. Echo 02/2021: LVEF 45-50%, unable to evaluate diastolic function due to mitral valve replacement, RV systolic function reduced, bioprosthetic mitral valve in situ. Medications reconciled. We will hold off on starting Farxiga given his renal function. Recent office note from his nephrologist Dr. Royce Macadamia reviewed dated 12/14/2021. We emphasized the importance of low-salt diet and medication compliance.  Hx of CABG / Atherosclerosis of native coronary artery of native heart without angina pectoris Denies angina pectoris. Surgery 2019, per patient.  Unaware of the surgical anatomy. Did not receive records from [facility] -requested several times.  History of mitral valve replacement with bioprosthetic valve Plan echocardiogram prior to the next office visit as a 1 year follow-up study to reevaluate LVEF and valve function.  Benign hypertension with chronic kidney disease, stage III ([place]) Office blood pressures are within acceptable range but not at goal. Medications reconciled. We will hold off on additional medication titration given his symptoms of dizziness, vertigo, feeling tired and fatigued. Patient is asked to keep a log of his blood pressures and to review it with either myself/PCP/nephrology to see if additional medication titration is warranted.  Mixed hyperlipidemia Continue statin therapy. Currently managed by primary care provider.  History of hemorrhagic stroke with residual hemiparesis ([place]) Educated on the importance of secondary prevention.  As a part of today's office visit reviewed prior records available in epic since last visit, last office note from Dr. Royce Macadamia from Dec 14, 2021 independently reviewed including  labs which are noted above for further reference.  FINAL MEDICATION LIST END OF ENCOUNTER:   Current Outpatient Medications:    albuterol (VENTOLIN HFA) 108 (90 Base) MCG/ACT inhaler, 2 puffs every 6 hours as needed for wheezing, Disp: 18 g, Rfl: 1   alum & mag hydroxide-simeth (MAALOX MAX) 656-812-75 MG/5ML suspension, Take 5 mLs by mouth every 6 (six) hours as needed for indigestion., Disp: 355 mL, Rfl: 0   amLODipine (NORVASC) 5 MG tablet, TAKE 1 TABLET(5 MG) BY MOUTH EVERY MORNING, Disp: 90 tablet, Rfl: 2   aspirin 81 MG chewable tablet, 1 tab by mouth daily with morning meal, Disp: 30 tablet, Rfl: 11   atorvastatin (LIPITOR) 40 MG tablet, 1 tab by mouth with evening meal., Disp: 30 tablet, Rfl: 11   ATROVENT HFA 17 MCG/ACT inhaler, Inhale 1 puff into the lungs., Disp: , Rfl:    carvedilol (COREG) 25 MG tablet, TAKE 1 TABLET(25 MG) BY MOUTH TWICE DAILY WITH A MEAL, Disp: 180 tablet, Rfl: 0   ENTRESTO 49-51 MG, 1 tab by mouth twice daily with meals, Disp: 60 tablet, Rfl: 11   famotidine (PEPCID) 40 MG tablet, Take 40 mg by mouth at bedtime., Disp: , Rfl:    furosemide (LASIX) 40 MG tablet, 1 tab by mouth with morning meal, Disp: 30 tablet, Rfl: 11   guaiFENesin (MUCINEX) 600 MG 12 hr tablet, Take 1 tablet (600 mg total) by mouth 2 (two) times daily., Disp: 30 tablet, Rfl: 0   isosorbide mononitrate (IMDUR) 60 MG 24 hr tablet, TAKE 1 TABLET(60 MG) BY MOUTH DAILY, Disp: 90 tablet, Rfl: 1   loratadine (CLARITIN) 10 MG tablet, 1 tab by mouth every morning for allergies, Disp: 30 tablet, Rfl: 11   NON FORMULARY, Take 1 tablet by mouth daily in  the afternoon. Vertigo nausea gummies, Disp: , Rfl:    NON FORMULARY, Take 1 tablet by mouth daily at 12 noon. Claritox Pro, Disp: , Rfl:    potassium chloride SA (KLOR-CON) 20 MEQ tablet, Take 20 mEq by mouth daily., Disp: , Rfl:    Vitamin D, Ergocalciferol, 50000 units CAPS, Take 1 capsule by mouth once a week. On Fridays with evening meal, Disp: 4  capsule, Rfl: 11  Orders Placed This Encounter  Procedures   PCV ECHOCARDIOGRAM COMPLETE    There are no Patient Instructions on file for this visit.   --Continue cardiac medications as reconciled in final medication list. --Return in about 6 months (around 06/24/2022) for Follow up HFrEF, Hx of CABG and MV replacement. . Or sooner if needed. --Continue follow-up with your primary care physician regarding the management of your other chronic comorbid conditions.  Patient's questions and concerns were addressed to his satisfaction. He voices understanding of the instructions provided during this encounter.   This note was created using a voice recognition software as a result there may be grammatical errors inadvertently enclosed that do not reflect the nature of this encounter. Every attempt is made to correct such errors.  Rex Kras, [place], [facility]  Pager: [phone] Office: [phone]

## 2021-12-23 ENCOUNTER — Other Ambulatory Visit: Payer: Self-pay

## 2021-12-23 ENCOUNTER — Emergency Department (HOSPITAL_COMMUNITY)
Admission: EM | Admit: 2021-12-23 | Discharge: 2021-12-23 | Disposition: A | Discharge status: Home / Self Care | Payer: Medicaid Other | Attending: Emergency Medicine | Admitting: Emergency Medicine

## 2021-12-23 ENCOUNTER — Emergency Department (HOSPITAL_COMMUNITY): Payer: Medicaid Other

## 2021-12-23 DIAGNOSIS — I509 Heart failure, unspecified: Secondary | ICD-10-CM | POA: Diagnosis not present

## 2021-12-23 DIAGNOSIS — R0602 Shortness of breath: Secondary | ICD-10-CM | POA: Insufficient documentation

## 2021-12-23 DIAGNOSIS — J029 Acute pharyngitis, unspecified: Secondary | ICD-10-CM | POA: Diagnosis present

## 2021-12-23 DIAGNOSIS — R0789 Other chest pain: Secondary | ICD-10-CM | POA: Insufficient documentation

## 2021-12-23 DIAGNOSIS — Z7982 Long term (current) use of aspirin: Secondary | ICD-10-CM | POA: Insufficient documentation

## 2021-12-23 DIAGNOSIS — T424X5A Adverse effect of benzodiazepines, initial encounter: Secondary | ICD-10-CM | POA: Diagnosis not present

## 2021-12-23 DIAGNOSIS — T887XXA Unspecified adverse effect of drug or medicament, initial encounter: Secondary | ICD-10-CM | POA: Diagnosis not present

## 2021-12-23 DIAGNOSIS — Z955 Presence of coronary angioplasty implant and graft: Secondary | ICD-10-CM | POA: Insufficient documentation

## 2021-12-23 DIAGNOSIS — N189 Chronic kidney disease, unspecified: Secondary | ICD-10-CM | POA: Diagnosis not present

## 2021-12-23 DIAGNOSIS — T50905A Adverse effect of unspecified drugs, medicaments and biological substances, initial encounter: Secondary | ICD-10-CM

## 2021-12-23 LAB — CBC
HCT: 43.8 % (ref 39.0–52.0)
Hemoglobin: 14.1 g/dL (ref 13.0–17.0)
MCH: 32.2 pg (ref 26.0–34.0)
MCHC: 32.2 g/dL (ref 30.0–36.0)
MCV: 100 fL (ref 80.0–100.0)
Platelets: 217 10*3/uL (ref 150–400)
RBC: 4.38 MIL/uL (ref 4.22–5.81)
RDW: 12.1 % (ref 11.5–15.5)
WBC: 6.1 10*3/uL (ref 4.0–10.5)
nRBC: 0 % (ref 0.0–0.2)

## 2021-12-23 LAB — BASIC METABOLIC PANEL
Anion gap: 10 (ref 5–15)
BUN: 18 mg/dL (ref 6–20)
CO2: 24 mmol/L (ref 22–32)
Calcium: 9 mg/dL (ref 8.9–10.3)
Chloride: 105 mmol/L (ref 98–111)
Creatinine, Ser: 2.02 mg/dL — ABNORMAL HIGH (ref 0.61–1.24)
GFR, Estimated: 40 mL/min — ABNORMAL LOW (ref >60)
Glucose, Bld: 116 mg/dL — ABNORMAL HIGH (ref 70–99)
Potassium: 3.1 mmol/L — ABNORMAL LOW (ref 3.5–5.1)
Sodium: 139 mmol/L (ref 135–145)

## 2021-12-23 LAB — BRAIN NATRIURETIC PEPTIDE: B Natriuretic Peptide: 35.4 pg/mL (ref 0.0–100.0)

## 2021-12-23 MED ORDER — POTASSIUM CHLORIDE CRYS ER 20 MEQ PO TBCR
40.0000 meq | EXTENDED_RELEASE_TABLET | Freq: Once | ORAL | Status: AC
  Administered 2021-12-23: 40 meq via ORAL
  Filled 2021-12-23: qty 2

## 2021-12-23 MED ORDER — DIPHENHYDRAMINE HCL 50 MG/ML IJ SOLN
25.0000 mg | Freq: Once | INTRAMUSCULAR | Status: AC
  Administered 2021-12-23: 25 mg via INTRAVENOUS
  Filled 2021-12-23: qty 1

## 2021-12-23 MED ORDER — METHYLPREDNISOLONE SODIUM SUCC 125 MG IJ SOLR
125.0000 mg | Freq: Once | INTRAMUSCULAR | Status: AC
  Administered 2021-12-23: 125 mg via INTRAVENOUS
  Filled 2021-12-23: qty 2

## 2021-12-23 MED ORDER — FAMOTIDINE IN NACL 20-0.9 MG/50ML-% IV SOLN
20.0000 mg | Freq: Once | INTRAVENOUS | Status: AC
  Administered 2021-12-23: 20 mg via INTRAVENOUS
  Filled 2021-12-23: qty 50

## 2021-12-23 NOTE — ED Provider Notes (Signed)
[facility] Provider Note   CSN: [id number] Arrival date & time: 12/23/21  0524     History  Chief Complaint  Patient presents with   Medication Reaction    John Price is a 51 y.o. male who presents via EMS with concern for possible allergic reaction to new medication.  That he took diazepam for the first time at 10 PM and then went to sleep.  He woke up at 4 AM feeling like his throat was sore like he needed to clear it, like his chest was tight, and "like if I close my eyes I would never wake up again".  He states that he had a similar reaction when he took gabapentin in the past.  Denies any true shortness of breath or difficulty swallowing.  Denies any nausea or vomiting or lightheadedness.  I personally reviewed this patient's medical records.  Has history of CABG, mitral valve replacement, CHF, CVA, and CKD.  He is not anticoagulated.  He did not take any medications for an allergic reaction after waking up this morning before calling EMS. No medications administered en route with EMS.   HPI     Home Medications Prior to Admission medications   Medication Sig Start Date End Date Taking? Authorizing Provider  albuterol (VENTOLIN HFA) 108 (90 Base) MCG/ACT inhaler 2 puffs every 6 hours as needed for wheezing 05/02/21   Julieanne MansonMulberry, Elizabeth, MD  alum & mag hydroxide-simeth (MAALOX MAX) 400-400-40 MG/5ML suspension Take 5 mLs by mouth every 6 (six) hours as needed for indigestion. 10/23/21   Horton, Clabe SealKristie M, DO  amLODipine (NORVASC) 5 MG tablet TAKE 1 TABLET(5 MG) BY MOUTH EVERY MORNING 06/06/21   Tolia, Sunit, DO  aspirin 81 MG chewable tablet 1 tab by mouth daily with morning meal 05/02/21   Julieanne MansonMulberry, Elizabeth, MD  atorvastatin (LIPITOR) 40 MG tablet 1 tab by mouth with evening meal. 05/02/21   Julieanne MansonMulberry, Elizabeth, MD  ATROVENT HFA 17 MCG/ACT inhaler Inhale 1 puff into the lungs. 06/10/21   [provider]  carvedilol (COREG)  25 MG tablet TAKE 1 TABLET(25 MG) BY MOUTH TWICE DAILY WITH A MEAL 06/14/21   Tolia, Sunit, DO  ENTRESTO 49-51 MG 1 tab by mouth twice daily with meals 05/02/21   Julieanne MansonMulberry, Elizabeth, MD  famotidine (PEPCID) 40 MG tablet Take 40 mg by mouth at bedtime. 12/06/21   [provider]  furosemide (LASIX) 40 MG tablet 1 tab by mouth with morning meal 05/02/21   Julieanne MansonMulberry, Elizabeth, MD  guaiFENesin (MUCINEX) 600 MG 12 hr tablet Take 1 tablet (600 mg total) by mouth 2 (two) times daily. 10/23/21   Horton, Danford BadKristie M, DO  isosorbide mononitrate (IMDUR) 60 MG 24 hr tablet TAKE 1 TABLET(60 MG) BY MOUTH DAILY 11/30/21   Tolia, Sunit, DO  loratadine (CLARITIN) 10 MG tablet 1 tab by mouth every morning for allergies 05/02/21   Julieanne MansonMulberry, Elizabeth, MD  NON FORMULARY Take 1 tablet by mouth daily in the afternoon. Vertigo nausea gummies    [provider]  NON FORMULARY Take 1 tablet by mouth daily at 12 noon. Claritox Pro    [provider]  potassium chloride SA (KLOR-CON) 20 MEQ tablet Take 20 mEq by mouth daily. 03/31/21   [provider]  Vitamin D, Ergocalciferol, 50000 units CAPS Take 1 capsule by mouth once a week. On Fridays with evening meal 05/02/21   Julieanne MansonMulberry, Elizabeth, MD      Allergies    Gabapentin  Review of Systems   Review of Systems  Constitutional: Negative.   HENT:  Positive for sore throat.   Respiratory:  Positive for shortness of breath.   Cardiovascular: Negative.   Gastrointestinal: Negative.   Genitourinary: Negative.   Musculoskeletal: Negative.   Skin: Negative.   Neurological: Negative.   Hematological: Negative.   Psychiatric/Behavioral:  The patient is nervous/anxious.    Physical Exam Updated Vital Signs BP (!) 143/95   Pulse (!) 52   Temp 98 F (36.7 C) (Oral)   Resp (!) 21   SpO2 98%  Physical Exam Vitals and nursing note reviewed.  Constitutional:      Appearance: He is not ill-appearing or toxic-appearing.  HENT:     Head:  Normocephalic and atraumatic.     Nose: Nose normal.     Mouth/Throat:     Mouth: Mucous membranes are moist.     Pharynx: Oropharynx is clear. Uvula midline. No oropharyngeal exudate or posterior oropharyngeal erythema.     Tonsils: No tonsillar exudate or tonsillar abscesses.     Comments: No sublingual or submental TTP. No intraoral lesions.  Eyes:     General: Lids are normal. Vision grossly intact.        Right eye: No discharge.        Left eye: No discharge.     Extraocular Movements: Extraocular movements intact.     Conjunctiva/sclera: Conjunctivae normal.     Pupils: Pupils are equal, round, and reactive to light.  Neck:     Trachea: Phonation normal. Tracheal tenderness present. No tracheostomy, abnormal tracheal secretions or tracheal deviation.     Meningeal: Brudzinski's sign and Kernig's sign absent.  Cardiovascular:     Rate and Rhythm: Normal rate and regular rhythm.     Pulses: Normal pulses.     Heart sounds: Normal heart sounds. No murmur heard. Pulmonary:     Effort: Pulmonary effort is normal. No tachypnea, bradypnea, accessory muscle usage, prolonged expiration or respiratory distress.     Breath sounds: Normal breath sounds. No wheezing or rales.  Chest:     Chest wall: No mass, lacerations, deformity, swelling, tenderness, crepitus or edema.  Abdominal:     General: Bowel sounds are normal. There is no distension.     Palpations: Abdomen is soft.     Tenderness: There is no abdominal tenderness. There is no right CVA tenderness, left CVA tenderness, guarding or rebound.  Musculoskeletal:        General: No deformity.     Cervical back: Normal range of motion and neck supple. No tenderness.     Right lower leg: No edema.     Left lower leg: No edema.  Lymphadenopathy:     Cervical: No cervical adenopathy.  Skin:    General: Skin is warm and dry.     Capillary Refill: Capillary refill takes less than 2 seconds.  Neurological:     General: No focal  deficit present.     Mental Status: He is alert and oriented to person, place, and time. Mental status is at baseline.     GCS: GCS eye subscore is 4. GCS verbal subscore is 5. GCS motor subscore is 6.     Gait: Gait is intact.  Psychiatric:        Mood and Affect: Mood normal.    ED Results / Procedures / Treatments   Labs (all labs ordered are listed, but only abnormal results are displayed) Labs Reviewed  BASIC METABOLIC PANEL  CBC  BRAIN NATRIURETIC PEPTIDE    EKG None  Radiology No results found.  Procedures Procedures    Medications Ordered in ED Medications  famotidine (PEPCID) IVPB 20 mg premix (has no administration in time range)  diphenhydrAMINE (BENADRYL) injection 25 mg (25 mg Intravenous Given 12/23/21 0634)  methylPREDNISolone sodium succinate (SOLU-MEDROL) 125 mg/2 mL injection 125 mg (125 mg Intravenous Given 12/23/21 5631)    ED Course/ Medical Decision Making/ A&P                           Medical Decision Making 51 year old male who presents with question of possible allergic reaction to diazepam.   Bradycardic to the 50s, near baseline. Tachypneic on intake. Cardiopulmonary exam is normal, abdominal exam is benign. Mild tracheal TTP, no sublingual or submental TTP. No posterior pharyngeal erythema, swelling, or lesion.   Amount and/or Complexity of Data Reviewed Labs: ordered. Radiology: ordered.  Risk Prescription drug management.   Patient appears to be experiencing allergic reaction, no sign of anaphylaxis at this time. No sign of oropharyngeal abscess or ludwig's angina. Care of this patient signed out to oncoming ED provider, Fayrene Helper, PA-C at time of shift change. Dispo pending completion of workup and reevaluation.   This chart was dictated using voice recognition software, Dragon. Despite the best efforts of this provider to proofread and correct errors, errors may still occur which can change documentation meaning.  Final Clinical  Impression(s) / ED Diagnoses Final diagnoses:  None    Rx / DC Orders ED Discharge Orders     None         Sherrilee Gilles 12/23/21 4970    Palumbo, April, MD 12/25/21 2327

## 2021-12-23 NOTE — Discharge Instructions (Addendum)
Please avoid taking Valium as it can cause negative side effects.  Follow-up with your doctor for further care.  Return if you have any concerns

## 2021-12-23 NOTE — ED Provider Notes (Signed)
   Received signout from previous provider, please see her note for complete H&P.  This is a 51 year old male presents with concerns wall possible allergic reaction to a new medication.  Patient took diazepam for the first time last night and went to sleep and when he woke up he reported sensation of throat closing, tightness of the chest which felt similar to an allergic reaction that he had in the past with a different medication.  On initial exam, patient without anaphylactic symptoms therefore.  Pain was not given.  Patient was given Solu-Medrol, Benadryl, and Pepcid.  On reevaluation patient is resting comfortably and reported feeling much better.  This time I felt patient stable to be discharged home.  Encourage patient to avoid offending agent and return precaution given.  BP (!) 159/101   Pulse 67   Temp 98 F (36.7 C) (Oral)   Resp (!) 22   SpO2 95%   Results for orders placed or performed during the hospital encounter of 12/23/21  Basic metabolic panel  Result Value Ref Range   Sodium 139 135 - 145 mmol/L   Potassium 3.1 (L) 3.5 - 5.1 mmol/L   Chloride 105 98 - 111 mmol/L   CO2 24 22 - 32 mmol/L   Glucose, Bld 116 (H) 70 - 99 mg/dL   BUN 18 6 - 20 mg/dL   Creatinine, Ser 5.10 (H) 0.61 - 1.24 mg/dL   Calcium 9.0 8.9 - 25.8 mg/dL   GFR, Estimated 40 (L) >60 mL/min   Anion gap 10 5 - 15  CBC  Result Value Ref Range   WBC 6.1 4.0 - 10.5 K/uL   RBC 4.38 4.22 - 5.81 MIL/uL   Hemoglobin 14.1 13.0 - 17.0 g/dL   HCT 52.7 78.2 - 42.3 %   MCV 100.0 80.0 - 100.0 fL   MCH 32.2 26.0 - 34.0 pg   MCHC 32.2 30.0 - 36.0 g/dL   RDW 53.6 14.4 - 31.5 %   Platelets 217 150 - 400 K/uL   nRBC 0.0 0.0 - 0.2 %  Brain natriuretic peptide  Result Value Ref Range   B Natriuretic Peptide 35.4 0.0 - 100.0 pg/mL   DG Chest Portable 1 View  Result Date: 12/23/2021 CLINICAL DATA:  Shortness of breath. EXAM: PORTABLE CHEST 1 VIEW COMPARISON:  10/23/2021 FINDINGS: 0630 hours. The cardio pericardial  silhouette is enlarged. The lungs are clear without focal pneumonia, edema, pneumothorax or pleural effusion. The visualized bony structures of the thorax are unremarkable. Telemetry leads overlie the chest. IMPRESSION: No active disease. Electronically Signed   By: Kennith Center M.D.   On: 12/23/2021 06:45       Fayrene Helper, PA-C 12/23/21 0856    Horton, Clabe Seal, DO 12/23/21 1012

## 2021-12-23 NOTE — ED Notes (Signed)
Pt stated that he does not feel difficulty breathing, but describes discomfort/difficulty with swallowing. Oxygen 98% on room air, not appearing in respiratory distress or discomfort at this time

## 2021-12-23 NOTE — ED Triage Notes (Signed)
Pt BIB [facility] from home, reports he took diazepam for the first time tonight at  10pm, woke up at 4am this morning with his throat "feeling funny" and c/o shortness of breath.

## 2022-02-01 ENCOUNTER — Emergency Department (HOSPITAL_COMMUNITY): Payer: Medicaid Other

## 2022-02-01 ENCOUNTER — Encounter (HOSPITAL_COMMUNITY): Payer: Self-pay | Admitting: Emergency Medicine

## 2022-02-01 ENCOUNTER — Emergency Department (HOSPITAL_COMMUNITY)
Admission: EM | Admit: 2022-02-01 | Discharge: 2022-02-02 | Disposition: A | Discharge status: Home / Self Care | Payer: Medicaid Other | Attending: Emergency Medicine | Admitting: Emergency Medicine

## 2022-02-01 DIAGNOSIS — Z7982 Long term (current) use of aspirin: Secondary | ICD-10-CM | POA: Insufficient documentation

## 2022-02-01 DIAGNOSIS — N189 Chronic kidney disease, unspecified: Secondary | ICD-10-CM | POA: Diagnosis not present

## 2022-02-01 DIAGNOSIS — Z951 Presence of aortocoronary bypass graft: Secondary | ICD-10-CM | POA: Insufficient documentation

## 2022-02-01 DIAGNOSIS — I130 Hypertensive heart and chronic kidney disease with heart failure and stage 1 through stage 4 chronic kidney disease, or unspecified chronic kidney disease: Secondary | ICD-10-CM | POA: Diagnosis not present

## 2022-02-01 DIAGNOSIS — R778 Other specified abnormalities of plasma proteins: Secondary | ICD-10-CM | POA: Insufficient documentation

## 2022-02-01 DIAGNOSIS — I509 Heart failure, unspecified: Secondary | ICD-10-CM | POA: Insufficient documentation

## 2022-02-01 DIAGNOSIS — R748 Abnormal levels of other serum enzymes: Secondary | ICD-10-CM | POA: Insufficient documentation

## 2022-02-01 DIAGNOSIS — R079 Chest pain, unspecified: Secondary | ICD-10-CM | POA: Diagnosis present

## 2022-02-01 DIAGNOSIS — Z79899 Other long term (current) drug therapy: Secondary | ICD-10-CM | POA: Diagnosis not present

## 2022-02-01 DIAGNOSIS — I10 Essential (primary) hypertension: Secondary | ICD-10-CM

## 2022-02-01 DIAGNOSIS — R0789 Other chest pain: Secondary | ICD-10-CM

## 2022-02-01 LAB — CBC WITH DIFFERENTIAL/PLATELET
Abs Immature Granulocytes: 0.01 10*3/uL (ref 0.00–0.07)
Basophils Absolute: 0.1 10*3/uL (ref 0.0–0.1)
Basophils Relative: 1 %
Eosinophils Absolute: 0.3 10*3/uL (ref 0.0–0.5)
Eosinophils Relative: 4 %
HCT: 41.6 % (ref 39.0–52.0)
Hemoglobin: 14 g/dL (ref 13.0–17.0)
Immature Granulocytes: 0 %
Lymphocytes Relative: 43 %
Lymphs Abs: 3.4 10*3/uL (ref 0.7–4.0)
MCH: 32.4 pg (ref 26.0–34.0)
MCHC: 33.7 g/dL (ref 30.0–36.0)
MCV: 96.3 fL (ref 80.0–100.0)
Monocytes Absolute: 0.8 10*3/uL (ref 0.1–1.0)
Monocytes Relative: 10 %
Neutro Abs: 3.2 10*3/uL (ref 1.7–7.7)
Neutrophils Relative %: 42 %
Platelets: 247 10*3/uL (ref 150–400)
RBC: 4.32 MIL/uL (ref 4.22–5.81)
RDW: 11.9 % (ref 11.5–15.5)
WBC: 7.7 10*3/uL (ref 4.0–10.5)
nRBC: 0 % (ref 0.0–0.2)

## 2022-02-01 NOTE — ED Triage Notes (Signed)
Per EMS, pt from home called out today for Sharp left sided chest pain that goes to his back.  Pt reports dizziness starting at the same time.  Given 324 ASA by EMS, however CP had resolved so no nitro.    154/100 70 HR 98% RA 18 RR CBG 96  Hx of TBI, thought process while slightly delayed but baseline.

## 2022-02-01 NOTE — ED Provider Triage Note (Signed)
Emergency Medicine Provider Triage Evaluation Note  John Price , a 51 y.o. male  was evaluated in triage.  Pt complains of chest pain.  Patient reports earlier this evening he started having some chest pains that he describes as sharp and radiating back to his back.  He reports they were very brief but happened a few times.  He denies associated shortness of breath.  No lightheadedness or syncope but did report some dizziness to EMS.  No nausea or vomiting.  Does not know if he has had any leg swelling.  Reports he has not been taking his blood pressure medication because he does not like the way it makes him feel.  Review of Systems  Positive: Chest pain, dizziness, cough Negative: Fever, shortness of breath, lower extremity swelling, syncope  Physical Exam  BP (!) 153/101   Pulse 61   Temp 98 F (36.7 C)   Resp 16   Ht 5\' 6"  (1.676 m)   Wt 120.5 kg   SpO2 96%   BMI 42.88 kg/m  Gen:   Awake, no distress   Resp:  Normal effort, CTA bilat. RRR MSK:   Moves extremities without difficulty  Other:    Medical Decision Making  Medically screening exam initiated at 10:50 PM.  Appropriate orders placed.  was informed that the remainder of the evaluation will be completed by another provider, this initial triage assessment does not replace that evaluation, and the importance of remaining in the ED until their evaluation is complete.  Patient in no acute distress, underlying history of CHF and CAD, chest pain work-up initiated from triage.   Zoila Shutter, Dartha Lodge 02/01/22 2306

## 2022-02-02 LAB — BASIC METABOLIC PANEL
Anion gap: 11 (ref 5–15)
BUN: 28 mg/dL — ABNORMAL HIGH (ref 6–20)
CO2: 27 mmol/L (ref 22–32)
Calcium: 9.2 mg/dL (ref 8.9–10.3)
Chloride: 104 mmol/L (ref 98–111)
Creatinine, Ser: 2.52 mg/dL — ABNORMAL HIGH (ref 0.61–1.24)
GFR, Estimated: 30 mL/min — ABNORMAL LOW (ref >60)
Glucose, Bld: 80 mg/dL (ref 70–99)
Potassium: 3.2 mmol/L — ABNORMAL LOW (ref 3.5–5.1)
Sodium: 142 mmol/L (ref 135–145)

## 2022-02-02 LAB — TROPONIN I (HIGH SENSITIVITY)
Troponin I (High Sensitivity): 28 ng/L — ABNORMAL HIGH (ref <18)
Troponin I (High Sensitivity): 26 ng/L — ABNORMAL HIGH (ref <18)

## 2022-02-02 LAB — BRAIN NATRIURETIC PEPTIDE: B Natriuretic Peptide: 20.8 pg/mL (ref 0.0–100.0)

## 2022-02-02 MED ORDER — ASPIRIN 81 MG PO CHEW
324.0000 mg | CHEWABLE_TABLET | Freq: Once | ORAL | Status: AC
  Administered 2022-02-02: 324 mg via ORAL
  Filled 2022-02-02: qty 4

## 2022-02-02 MED ORDER — AMLODIPINE BESYLATE 5 MG PO TABS: 5.0000 mg | ORAL_TABLET | Freq: Once | ORAL | Status: DC

## 2022-02-02 NOTE — Discharge Instructions (Addendum)
We saw you in the ER for the chest pain/shortness of breath. All of our cardiac workup is normal, including labs, EKG and chest X-RAY are normal. We are not sure what is causing your discomfort, but we feel comfortable sending you home at this time. The workup in the ER is not complete, and you should follow up with your primary care doctor for further evaluation.  Please return to the ER if you have worsening chest pain, shortness of breath, pain radiating to your jaw, shoulder, or back, sweats or fainting. Otherwise see the Cardiologist or your primary care doctor as requested.  Additionally, your kidney function is slightly worse than normal.  Your blood pressure is also slightly higher than normal.  Please increase your amlodipine to 10 mg every day and follow-up with your primary care doctor for repeat check of your kidney function and blood pressure in 7 to 10 days.

## 2022-02-02 NOTE — ED Provider Notes (Signed)
[facility] EMERGENCY DEPARTMENT Provider Note   CSN: [id number] Arrival date & time: 02/01/22  2230     History  Chief Complaint  Patient presents with   Chest Pain    John Price is a 51 y.o. male.  HPI     51 y/o male with history of CHF, hypertension, hyperlipidemia, mitral valve replacement, CKD comes in with chief complaint of chest pain.  Patient indicates that he started having sharp left-sided chest pain radiating to his back sometime last evening.  The symptoms last only for a second or so, but are recurrent.  There is no specific evoking, aggravating or relieving factors.  Patient denies any associated shortness of breath, sweating.  No history of similar pain, which got him concerned.  Although patient's history list CABG, patient denies any history of obstructive coronary artery disease that required surgical intervention.  He has well history that was replaced in 2019 in [place].  Patient denies any illicit substance use.  Home Medications Prior to Admission medications   Medication Sig Start Date End Date Taking? Authorizing Provider  albuterol (VENTOLIN HFA) 108 (90 Base) MCG/ACT inhaler 2 puffs every 6 hours as needed for wheezing 05/02/21   Julieanne Manson, MD  alum & mag hydroxide-simeth (MAALOX MAX) 400-400-40 MG/5ML suspension Take 5 mLs by mouth every 6 (six) hours as needed for indigestion. 10/23/21   Horton, Clabe Seal, DO  amLODipine (NORVASC) 5 MG tablet TAKE 1 TABLET(5 MG) BY MOUTH EVERY MORNING 06/06/21   Tolia, Sunit, DO  aspirin 81 MG chewable tablet 1 tab by mouth daily with morning meal 05/02/21   Julieanne Manson, MD  atorvastatin (LIPITOR) 40 MG tablet 1 tab by mouth with evening meal. 05/02/21   Julieanne Manson, MD  ATROVENT HFA 17 MCG/ACT inhaler Inhale 1 puff into the lungs. 06/10/21   [provider]  carvedilol (COREG) 25 MG tablet TAKE 1 TABLET(25 MG) BY MOUTH TWICE DAILY WITH A MEAL 06/14/21   Tolia,  Sunit, DO  ENTRESTO 49-51 MG 1 tab by mouth twice daily with meals 05/02/21   Julieanne Manson, MD  famotidine (PEPCID) 40 MG tablet Take 40 mg by mouth at bedtime. 12/06/21   [provider]  furosemide (LASIX) 40 MG tablet 1 tab by mouth with morning meal 05/02/21   Julieanne Manson, MD  guaiFENesin (MUCINEX) 600 MG 12 hr tablet Take 1 tablet (600 mg total) by mouth 2 (two) times daily. 10/23/21   Horton, Danford Bad M, DO  isosorbide mononitrate (IMDUR) 60 MG 24 hr tablet TAKE 1 TABLET(60 MG) BY MOUTH DAILY 11/30/21   Tolia, Sunit, DO  loratadine (CLARITIN) 10 MG tablet 1 tab by mouth every morning for allergies 05/02/21   Julieanne Manson, MD  NON FORMULARY Take 1 tablet by mouth daily in the afternoon. Vertigo nausea gummies    [provider]  NON FORMULARY Take 1 tablet by mouth daily at 12 noon. Claritox Pro    [provider]  potassium chloride SA (KLOR-CON) 20 MEQ tablet Take 20 mEq by mouth daily. 03/31/21   [provider]  Vitamin D, Ergocalciferol, 50000 units CAPS Take 1 capsule by mouth once a week. On Fridays with evening meal 05/02/21   Julieanne Manson, MD      Allergies    Gabapentin    Review of Systems   Review of Systems  All other systems reviewed and are negative.   Physical Exam Updated Vital Signs BP (!) 112/99 (BP Location: Right Arm)  Pulse 91   Temp 98 F (36.7 C)   Resp 18   Ht 5\' 8"  (1.727 m)   Wt (!) 143.3 kg   SpO2 92%   BMI 48.04 kg/m  Physical Exam Vitals and nursing note reviewed.  Constitutional:      Appearance: He is well-developed.  HENT:     Head: Atraumatic.  Cardiovascular:     Rate and Rhythm: Normal rate.  Pulmonary:     Effort: Pulmonary effort is normal.  Musculoskeletal:     Cervical back: Neck supple.  Skin:    General: Skin is warm.  Neurological:     Mental Status: He is alert and oriented to person, place, and time.     ED Results / Procedures / Treatments   Labs (all labs  ordered are listed, but only abnormal results are displayed) Labs Reviewed  BASIC METABOLIC PANEL - Abnormal; Notable for the following components:      Result Value   Potassium 3.2 (*)    BUN 28 (*)    Creatinine, Ser 2.52 (*)    GFR, Estimated 30 (*)    All other components within normal limits  TROPONIN I (HIGH SENSITIVITY) - Abnormal; Notable for the following components:   Troponin I (High Sensitivity) 28 (*)    All other components within normal limits  TROPONIN I (HIGH SENSITIVITY) - Abnormal; Notable for the following components:   Troponin I (High Sensitivity) 26 (*)    All other components within normal limits  BRAIN NATRIURETIC PEPTIDE  CBC WITH DIFFERENTIAL/PLATELET    EKG EKG Interpretation  Date/Time:  Thursday February 02 2022 08:29:07 EDT Ventricular Rate:  78 PR Interval:  248 QRS Duration: 100 QT Interval:  443 QTC Calculation: 505 R Axis:   -83 Text Interpretation: Sinus arrhythmia Prolonged PR interval Left anterior fascicular block Abnormal R-wave progression, late transition Borderline T wave abnormalities Prolonged QT interval No acute changes Confirmed by [id number]) on 02/02/2022 8:35:39 AM  Radiology DG Chest 2 View  Result Date: 02/01/2022 CLINICAL DATA:  Chest pain, high blood pressure, heart valve replacement in 2010. EXAM: CHEST - 2 VIEW COMPARISON:  12/23/2021 FINDINGS: The heart size and mediastinal contours are within normal limits. There is evidence of prior cardiothoracic surgery. No consolidation, effusion, or pneumothorax. Degenerative changes are present in the thoracic spine. No acute osseous abnormality. IMPRESSION: No active cardiopulmonary disease. Electronically Signed   By: 12/25/2021 M.D.   On: 02/01/2022 23:20    Procedures Procedures    Medications Ordered in ED Medications - No data to display  ED Course/ Medical Decision Making/ A&P                           Medical Decision Making  This patient presents to the  ED with chief complaint(s) of chest pain with pertinent past medical history of mitral valve replacement, hypertension, hyperlipidemia, questionable CABG which further complicates the presenting complaint. The complaint involves an extensive differential diagnosis and also carries with it a high risk of complications and morbidity.     The chest pain is very atypical in nature, despite being typical in location.  It is sharp, lasts just for a second and is recurrent without any provoking factors.   No concerning constitutional is associated with it.  The differential diagnosis includes : ACS syndrome Aortic dissection CHF exacerbation Valvular disorder PE Pneumothorax Musculoskeletal pain PUD / Gastritis / Esophagitis Esophageal spasm   Plan  is to rule out MI, evaluate for ACS Patient is currently chest pain-free.  Additional history obtained: Records reviewed  cardiology note, patient sees Dr. Terri Skains.  Reviewed patient's last visit in May.  Independent labs interpretation:  The following labs were independently interpreted: Initial high-sensitivity troponin of 28 followed by 26. Patient's creatinine is at 2.5.  His baseline creatinine is 2.  He is noted to have mild hypertension now  Independent visualization of imaging: - I independently visualized the following imaging with scope of interpretation limited to determining acute life threatening conditions related to emergency care: X-ray of the chest, which revealed no evidence of pneumothorax. The x-ray also does not have any evidence of instrumentation that we typically see with CABG.   Consultation: - Consulted or discussed management/test interpretation w/ external professional: Discussed case with Dr. Einar Gip.  Discussed patient's atypical chest pain along with concerning risk factors, heart score of 4, EKG with no acute ischemic changes. I discussed with him patient's documented history of CABG, but no evidence of CABG on the x-ray  or graft taken out from his leg.   Dr. Einar Gip thinks that the patient can be managed as an outpatient.  He has sent a message to his clinic to ensure patient gets a prompt follow-up.  He does recommend that patients takes amlodipine 10 mg right now.  He is aware that patient's BP was high when he came, but it is now within normal range.  We will not give patient extra amlodipine at this point, but will advise that he starts taking 10 mg amlodipine instead of 5 upon discharge.  We also discussed with him elevated troponin and strict return precautions for worsening, severe chest pain.  We also discussed with him rising creatinine and the need for his blood pressure and creatinine to be checked by his PCP again in 7 to 10 days.   We did consider admission of this patient for ACS in the light of elevated troponin and heart score of 4.  After discussion with the cardiology service, decision is to manage him as an outpatient with prompt follow-up.  Final Clinical Impression(s) / ED Diagnoses Final diagnoses:  Atypical chest pain  Troponin level elevated  Elevated creatine kinase  Chronic kidney disease, unspecified CKD stage  Primary hypertension    Rx / DC Orders ED Discharge Orders     None         Varney Biles, MD 02/02/22 0930

## 2022-02-02 NOTE — Discharge Planning (Signed)
Transitions of Care consulted regarding pt needing help completing paperwork.  RNCM met with pt at bedside to find that paperwork was completed and only needed his signature/signature of person who completed.  RNCM explained that pt can take paperwork to [facility] on [address] for processing.  Pt inquired about getting a peer support person.  RNCM reached out to [facility] team and is awaiting information on obtaining peer support for pt.  Will update team with information.

## 2022-02-02 NOTE — ED Notes (Signed)
Patient verbalizes understanding of discharge instructions. Opportunity for questioning and answers were provided. Pt discharged from ED. 

## 2022-02-09 ENCOUNTER — Ambulatory Visit: Payer: Medicaid Other | Admitting: Cardiology

## 2022-02-09 ENCOUNTER — Encounter: Payer: Self-pay | Admitting: Cardiology

## 2022-02-09 VITALS — BP 123/75 | HR 54 | Temp 98.6°F | Resp 16 | Ht 68.0 in | Wt 264.8 lb

## 2022-02-09 DIAGNOSIS — I2510 Atherosclerotic heart disease of native coronary artery without angina pectoris: Secondary | ICD-10-CM

## 2022-02-09 DIAGNOSIS — N183 Hypertensive chronic kidney disease with stage 1 through stage 4 chronic kidney disease, or unspecified chronic kidney disease: Secondary | ICD-10-CM

## 2022-02-09 DIAGNOSIS — R072 Precordial pain: Secondary | ICD-10-CM

## 2022-02-09 DIAGNOSIS — I69359 Hemiplegia and hemiparesis following cerebral infarction affecting unspecified side: Secondary | ICD-10-CM

## 2022-02-09 DIAGNOSIS — Z953 Presence of xenogenic heart valve: Secondary | ICD-10-CM

## 2022-02-09 DIAGNOSIS — E782 Mixed hyperlipidemia: Secondary | ICD-10-CM

## 2022-02-09 DIAGNOSIS — I5022 Chronic systolic (congestive) heart failure: Secondary | ICD-10-CM

## 2022-02-09 NOTE — Progress Notes (Addendum)
ID:  John Price, DOB Dec 01, 1970, MRN [id number]  PCP:  Dulce Sellar, MD  Cardiologist:  Rex Kras, DO, [facility]  (established care 02/28/2021) Nephrologist: Dr. Harrie Jeans Former Cardiology Providers: Lynne Leader ([place])  Date: 02/09/22 Last Office Visit: 12/22/2021  Chief Complaint  Patient presents with   Heart failure management    Chest Pain   Hospitalization Follow-up   HPI  John Price is a 51 y.o. 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  In 22 patient moved from [place] to [place] and was referred to the practice given his history of mitral valve replacement and questionable history of CABG.  Given the fact that he is poor historian and limited records are available overall accuracy of the information is limited.  Based on limited records available he had a hemorrhagic stroke in 2018 which resulted in left-sided weakness.  The stroke was felt to be secondary to uncontrolled hypertension.  In 2019 he underwent mitral valve replacement and a questionable CABG per patient.  Surgical anatomy is unknown an echocardiogram performed after establishing care conference with bioprosthetic mitral valve.  He presents to the office today for sooner visit after recent ED visit for chest pain.  He does not have any active chest pain but what prompted him to the ED was left-sided discomfort underneath the breast, intensity 3 out of 10, lasting for few seconds, nonexertional, did not resolve with resting.  His overall functional capacity is limited.    In 2022 patient relocated from [place] to [place] and was referred to the  practice given his history of CAD/CABG and mitral valve replacement.  Patient is a poor historian and based on limited records available he had a hemorrhagic stroke in 2018 resulted in left-sided residual weakness.  The stroke was likely secondary to uncontrolled hypertension.  Following year in 2019 he underwent CABG and mitral valve replacement per patient.  Surgical anatomy is unknown and based on the recent echocardiogram he has a bioprosthetic mitral valve.  Since last office visit patient denies any anginal discomfort and shortness of breath remains chronic and stable.  He has not been hospitalized for heart failure exacerbation.  He denies orthopnea, paroxysmal nocturnal dyspnea or lower extremity swelling.  Patient has chronic kidney disease and most recent creatinine was 2.5 mg/dL and baseline around 2 mg/dL.  High sensitive troponins were slightly elevated likely due to CKD and no significant rise and fall was seen to suggest ACS.  FUNCTIONAL STATUS: No structured exercise program or daily routine.   ALLERGIES: Allergies  Allergen Reactions   Gabapentin Anaphylaxis    MEDICATION LIST PRIOR TO VISIT: Current Meds  Medication Sig   albuterol (VENTOLIN HFA) 108 (90 Base) MCG/ACT inhaler 2 puffs every 6 hours as needed for wheezing   alum & mag hydroxide-simeth (MAALOX MAX) 400-400-40 MG/5ML suspension Take 5 mLs by mouth every 6 (six) hours as needed for indigestion.   amLODipine (NORVASC) 5 MG tablet TAKE 1 TABLET(5 MG) BY MOUTH EVERY MORNING   aspirin 81 MG chewable tablet 1 tab by mouth daily with morning meal   atorvastatin (LIPITOR) 40 MG tablet 1 tab by mouth with evening meal.   ATROVENT HFA 17 MCG/ACT inhaler Inhale 1 puff into the lungs.  carvedilol (COREG) 25 MG tablet TAKE 1 TABLET(25 MG) BY MOUTH TWICE DAILY WITH A MEAL   ENTRESTO 49-51 MG 1 tab by mouth twice daily with meals   famotidine (PEPCID) 40 MG tablet Take 40 mg by mouth at bedtime.   furosemide (LASIX) 40 MG  tablet 1 tab by mouth with morning meal   guaiFENesin (MUCINEX) 600 MG 12 hr tablet Take 1 tablet (600 mg total) by mouth 2 (two) times daily.   isosorbide mononitrate (IMDUR) 60 MG 24 hr tablet TAKE 1 TABLET(60 MG) BY MOUTH DAILY   loratadine (CLARITIN) 10 MG tablet 1 tab by mouth every morning for allergies   NON FORMULARY Take 1 tablet by mouth daily in the afternoon. Vertigo nausea gummies   NON FORMULARY Take 1 tablet by mouth daily at 12 noon. Claritox Pro   potassium chloride SA (KLOR-CON) 20 MEQ tablet Take 20 mEq by mouth daily.   Vitamin D, Ergocalciferol, 50000 units CAPS Take 1 capsule by mouth once a week. On Fridays with evening meal     PAST MEDICAL HISTORY: Past Medical History:  Diagnosis Date   Asthma    CHF (congestive heart failure) (HCC)    Chronic kidney disease    Coronary artery disease    Hyperlipidemia    Hypertension    Primary hypertension 12/28/2020   Stroke [facility])     PAST SURGICAL HISTORY: Past Surgical History:  Procedure Laterality Date   ANKLE ARTHODESIS W/ ARTHROSCOPY Left    APPENDECTOMY     CARDIAC VALVE REPLACEMENT     CORONARY ARTERY BYPASS GRAFT  2019    FAMILY HISTORY: The patient family history includes Early death in his sister; Epilepsy in his sister; Hypertension in his brother, brother, and mother; Kidney disease in his mother; Seizures in his sister.  SOCIAL HISTORY:  The patient  reports that he quit smoking about 4 years ago. His smoking use included cigarettes. He has a 3.00 pack-year smoking history. He has never used smokeless tobacco. He reports current alcohol use. He reports that he does not use drugs.  REVIEW OF SYSTEMS: Review of Systems  Constitutional: Positive for malaise/fatigue.  Cardiovascular:  Positive for chest pain (see HPI) and dyspnea on exertion (Chronic and stable). Negative for leg swelling, near-syncope, orthopnea, palpitations, paroxysmal nocturnal dyspnea and syncope.  Respiratory:  Positive for  shortness of breath (chronic and stable).   Neurological:  Positive for dizziness and vertigo.   PHYSICAL EXAM:    02/09/2022   10:33 AM 02/02/2022    9:15 AM 02/02/2022    9:10 AM  Vitals with BMI  Height '5\' 8"'     Weight 264 lbs 13 oz    BMI 40.10    Systolic 272    Diastolic 75    Pulse 54 73 68   No data found.  CONSTITUTIONAL: Appears older than stated age, hemodynamically stable, no acute distress, ambulates with a cane.  SKIN: Skin is warm and dry. No rash noted. No cyanosis. No pallor. No jaundice HEAD: Normocephalic and atraumatic.  EYES: No scleral icterus.  Arcus senilis MOUTH/THROAT: Moist oral membranes.  NECK: No JVD present. No thyromegaly noted. CHEST Normal respiratory effort. No intercostal retractions.  Sternotomy site is clean dry and intact LUNGS: Clear to auscultation bilaterally.  No stridor. No wheezes. No rales.  CARDIOVASCULAR: Regular rate and rhythm, positive Z3-G6, soft holosystolic murmur heard at the apex, no gallops or rubs. ABDOMINAL: Obese, soft, nontender, nondistended, positive bowel sounds in all 4 quadrants, no apparent  ascites.  EXTREMITIES: No peripheral edema, 2+ posterior tibial pulses bilaterally. HEMATOLOGIC: No significant bruising NEUROLOGIC: Oriented to person, place, and time.  5+ right upper and lower extremity strength, 3+ left upper and lower extremity strength. Normal muscle tone.  PSYCHIATRIC: Normal mood and affect. Normal behavior. Cooperative  CARDIAC DATABASE: EKG: 02/28/2021: Normal sinus rhythm, 72 bpm, left axis, left anterior fascicular block, poor R wave progression, nonspecific ST-T changes in the high lateral leads cannot rule out ischemia.  Without underlying injury pattern.  No prior ECGs available for comparison.  09/08/2021: Normal sinus rhythm with sinus arrhythmia, 62 bpm, left axis, left anterior fascicular block.  02/09/2022: Sinus  Bradycardia, 58bpm, first degree A-V block, left axis, LAFB, nonspecific  T-abnormality.  Echocardiogram: 11/2019 [facility]: LVEF 35%, moderate/severe concentric hypertrophy, moderately reduced global left ventricular systolic function, bioprosthetic mitral valve well-seated, normal position.  Mild TR.  Mild LAE.  03/11/2021: LVEF 45-50%, global hypokinesis, moderate LVH, unable to comment on diastolic function due to mitral valve replacement, RV systolic function reduced (RV S' 7cm/sec and TAPSE 1.47cm), RV size mildly enlarged, normal PASP, mild LAE, bioprosthetic mitral valve in situ, well-seated, normal structure and function.  RAP 8 mmHg.    LABORATORY DATA:    Latest Ref Rng & Units 02/01/2022   10:58 PM 12/23/2021    6:30 AM 07/21/2021    1:21 AM  CBC  WBC 4.0 - 10.5 K/uL 7.7  6.1  6.8   Hemoglobin 13.0 - 17.0 g/dL 14.0  14.1  13.6   Hematocrit 39.0 - 52.0 % 41.6  43.8  38.4   Platelets 150 - 400 K/uL 247  217  271        Latest Ref Rng & Units 02/01/2022   10:58 PM 12/23/2021    6:30 AM 07/21/2021    1:21 AM  CMP  Glucose 70 - 99 mg/dL 80  116  119   BUN 6 - 20 mg/dL '28  18  28   ' Creatinine 0.61 - 1.24 mg/dL 2.52  2.02  2.41   Sodium 135 - 145 mmol/L 142  139  139   Potassium 3.5 - 5.1 mmol/L 3.2  3.1  3.0   Chloride 98 - 111 mmol/L 104  105  104   CO2 22 - 32 mmol/L '27  24  28   ' Calcium 8.9 - 10.3 mg/dL 9.2  9.0  8.9     Lipid Panel  No results found for: "CHOL", "TRIG", "HDL", "CHOLHDL", "VLDL", "LDLCALC", "LDLDIRECT", "LABVLDL"  No components found for: "NTPROBNP" No results for input(s): "PROBNP" in the last 8760 hours. No results for input(s): "TSH" in the last 8760 hours.   BMP Recent Labs    07/21/21 0121 12/23/21 0630 02/01/22 2258  NA 139 139 142  K 3.0* 3.1* 3.2*  CL 104 105 104  CO2 '28 24 27  ' GLUCOSE 119* 116* 80  BUN 28* 18 28*  CREATININE 2.41* 2.02* 2.52*  CALCIUM 8.9 9.0 9.2  GFRNONAA 32* 40* 30*    HEMOGLOBIN A1C Lab Results  Component Value Date   HGBA1C 5.2 05/02/2021   External  Labs: Collected: 12/14/2021 by nephrology. BUN 24, creatinine 2.2, EGFR 36. Sodium 141, potassium 3.7, chloride 102, bicarb 30. Magnesium 1.9. Hemoglobin 14.5 g/dL. Random protein to creatinine ratio 309 mg/g Cr with   IMPRESSION:    ICD-10-CM   1. Precordial pain  R07.2 PCV MYOCARDIAL PERFUSION WITH LEXISCAN    2. Chronic systolic heart failure (HCC)  I50.22 EKG 12-Lead  3. Atherosclerosis of native coronary artery of native heart without angina pectoris  I25.10     4. History of mitral valve replacement with bioprosthetic valve  Z95.3     5. Benign hypertension with chronic kidney disease, stage III (HCC)  I12.9    N18.30     6. Mixed hyperlipidemia  E78.2     7. History of hemorrhagic stroke with residual hemiparesis [facility])  I69.359        RECOMMENDATIONS: MOHAMADOU MACIVER is a 51 y.o. 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  Precordial pain No active pain. Discomfort that led to the recent ED visit likely noncardiac. EKG shows sinus bradycardia with nonspecific ST-T changes. Recent echocardiogram notes mildly reduced LVEF. We will proceed with pharmacological stress test given the EKG findings and patient unable to exercise as he ambulates with a cane regularly. In the past patient has verbalized a history of CABG and valve replacement; however, surgical anatomy is unknown.  Now patient states that he does not recall having history of CABG in the past.  We will request records from his hospitalization in [place].  No records available in Care Everywhere.  Of note, no surgical scars noted to the bilateral lower extremity and no radial harvesting for physical  examination findings.  Chronic systolic heart failure (HCC) Euvolemic. No recent hospitalizations for CHF Echo 02/2021: LVEF 45-50%, bioprosthetic mitral valve in situ. Medications reconciled. Continue current medical therapy. Currently follows up with Dr. Royce Macadamia given his underlying CKD.  Will defer volume management to nephrology at this time.  History of mitral valve replacement with bioprosthetic valve Last echo August 2022. Reemphasized importance of endocarditis prophylaxis. Continue aspirin 81 mg p.o. daily Monitor for now  Benign hypertension with chronic kidney disease, stage III ([place]) Office blood pressures are well controlled. Medications reconciled.  Mixed hyperlipidemia Currently on atorvastatin.   He denies myalgia or other side effects. Currently managed by primary care provider.  History of hemorrhagic stroke with residual hemiparesis ([place]) Reemphasized importance of secondary prevention  As part of today's office visit reviewed ED documentation, labs, chest x-ray.  Findings discussed with the patient.  Ordered additional diagnostic work-up as noted above.  FINAL MEDICATION LIST END OF ENCOUNTER:   Current Outpatient Medications:    albuterol (VENTOLIN HFA) 108 (90 Base) MCG/ACT inhaler, 2 puffs every 6 hours as needed for wheezing, Disp: 18 g, Rfl: 1   alum & mag hydroxide-simeth (MAALOX MAX) 694-854-62 MG/5ML suspension, Take 5 mLs by mouth every 6 (six) hours as needed for indigestion., Disp: 355 mL, Rfl: 0   amLODipine (NORVASC) 5 MG tablet, TAKE 1 TABLET(5 MG) BY MOUTH EVERY MORNING, Disp: 90 tablet, Rfl: 2   aspirin 81 MG chewable tablet, 1 tab by mouth daily with morning meal, Disp: 30 tablet, Rfl: 11   atorvastatin (LIPITOR) 40 MG tablet, 1 tab by mouth with evening meal., Disp: 30 tablet, Rfl: 11   ATROVENT HFA 17 MCG/ACT inhaler, Inhale 1 puff into the lungs., Disp: , Rfl:    carvedilol (COREG) 25 MG tablet, TAKE 1 TABLET(25 MG) BY MOUTH TWICE DAILY WITH  A MEAL, Disp: 180 tablet, Rfl: 0   ENTRESTO 49-51 MG, 1 tab by mouth twice daily with meals, Disp: 60 tablet, Rfl: 11  famotidine (PEPCID) 40 MG tablet, Take 40 mg by mouth at bedtime., Disp: , Rfl:    furosemide (LASIX) 40 MG tablet, 1 tab by mouth with morning meal, Disp: 30 tablet, Rfl: 11   guaiFENesin (MUCINEX) 600 MG 12 hr tablet, Take 1 tablet (600 mg total) by mouth 2 (two) times daily., Disp: 30 tablet, Rfl: 0   isosorbide mononitrate (IMDUR) 60 MG 24 hr tablet, TAKE 1 TABLET(60 MG) BY MOUTH DAILY, Disp: 90 tablet, Rfl: 1   loratadine (CLARITIN) 10 MG tablet, 1 tab by mouth every morning for allergies, Disp: 30 tablet, Rfl: 11   NON FORMULARY, Take 1 tablet by mouth daily in the afternoon. Vertigo nausea gummies, Disp: , Rfl:    NON FORMULARY, Take 1 tablet by mouth daily at 12 noon. Claritox Pro, Disp: , Rfl:    potassium chloride SA (KLOR-CON) 20 MEQ tablet, Take 20 mEq by mouth daily., Disp: , Rfl:    Vitamin D, Ergocalciferol, 50000 units CAPS, Take 1 capsule by mouth once a week. On Fridays with evening meal, Disp: 4 capsule, Rfl: 11  Orders Placed This Encounter  Procedures   PCV MYOCARDIAL PERFUSION WITH LEXISCAN   EKG 12-Lead    There are no Patient Instructions on file for this visit.   --Continue cardiac medications as reconciled in final medication list. --Return in about 6 weeks (around 03/23/2022) for Reevaluation of, Chest pain, Review test results. Or sooner if needed. --Continue follow-up with your primary care physician regarding the management of your other chronic comorbid conditions.  Patient's questions and concerns were addressed to his satisfaction. He voices understanding of the instructions provided during this encounter.   This note was created using a voice recognition software as a result there may be grammatical errors inadvertently enclosed that do not reflect the nature of this encounter. Every attempt is made to correct such errors.  Rex Kras,  [place], [facility]  Pager: [phone] Office: [phone]

## 2022-02-12 ENCOUNTER — Encounter: Payer: Self-pay | Admitting: Cardiology

## 2022-03-15 ENCOUNTER — Telehealth: Payer: Self-pay | Admitting: Cardiology

## 2022-03-15 NOTE — Telephone Encounter (Signed)
Med refill  ENTRESTO 49-51 MG  [facility] [employer] [facility] [employer]

## 2022-03-16 ENCOUNTER — Other Ambulatory Visit: Payer: Self-pay

## 2022-03-16 MED ORDER — ENTRESTO 49-51 MG PO TABS: ORAL_TABLET | ORAL | 11 refills | Status: DC

## 2022-03-16 NOTE — Telephone Encounter (Signed)
Refill has been sent.  °

## 2022-03-23 ENCOUNTER — Ambulatory Visit: Payer: Medicaid Other | Admitting: Cardiology

## 2022-03-23 ENCOUNTER — Encounter: Payer: Self-pay | Admitting: Cardiology

## 2022-03-23 VITALS — BP 150/91 | HR 53 | Temp 98.0°F | Resp 16 | Ht 68.0 in | Wt 261.0 lb

## 2022-03-23 DIAGNOSIS — I2510 Atherosclerotic heart disease of native coronary artery without angina pectoris: Secondary | ICD-10-CM

## 2022-03-23 DIAGNOSIS — N183 Hypertensive chronic kidney disease with stage 1 through stage 4 chronic kidney disease, or unspecified chronic kidney disease: Secondary | ICD-10-CM

## 2022-03-23 DIAGNOSIS — E782 Mixed hyperlipidemia: Secondary | ICD-10-CM

## 2022-03-23 DIAGNOSIS — Z953 Presence of xenogenic heart valve: Secondary | ICD-10-CM

## 2022-03-23 DIAGNOSIS — I5042 Chronic combined systolic (congestive) and diastolic (congestive) heart failure: Secondary | ICD-10-CM

## 2022-03-23 DIAGNOSIS — I69359 Hemiplegia and hemiparesis following cerebral infarction affecting unspecified side: Secondary | ICD-10-CM

## 2022-03-23 MED ORDER — ENTRESTO 49-51 MG PO TABS: ORAL_TABLET | ORAL | 11 refills | Status: DC

## 2022-03-23 MED ORDER — EMPAGLIFLOZIN 10 MG PO TABS: 10.0000 mg | ORAL_TABLET | Freq: Every day | ORAL | 0 refills | Status: AC

## 2022-03-23 NOTE — Progress Notes (Signed)
ID:  GABRIELL CASIMIR, DOB 05-01-1971, MRN [id number]  PCP:  Dulce Sellar, MD  Cardiologist:  Rex Kras, DO, [facility]  (established care 02/28/2021) Nephrologist: Dr. Harrie Jeans Former Cardiology Providers: Lynne Leader ([place])  Date: 03/23/22 Last Office Visit: 02/09/2022  Chief Complaint  Patient presents with   Chest Pain   Follow-up   HPI  John Price is a 51 y.o. 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  In 2022 after moving to [facility] [place] was referred to the practice given his history of mitral valve replacement.  In the past patient stated he also had bypass surgery at the time of valve replacement and recently denies any history of undergoing bypass surgery.  He had a hemorrhagic stroke in 2018 which resulted in left-sided weakness.  The stroke was felt to be secondary to uncontrolled hypertension.  In 2019 he underwent mitral valve replacement and since then has been on medical therapy for heart failure.  At last office visit shared decision was to proceed with stress test given his recent visit to the ED for chest pain.  However, patient forgot to undergo stress test prior to today's office visit.   Since last office visit he has not had any reoccurrence of chest pain.  Patient is more concerned about his change in insurance coverage and him needing peer support.  He is also requesting assistance with Delene Loll as it is currently cost prohibitive.   FUNCTIONAL STATUS: No structured exercise program or daily routine.   ALLERGIES: Allergies  Allergen Reactions   Gabapentin Anaphylaxis    MEDICATION LIST PRIOR TO  VISIT: Current Meds  Medication Sig   albuterol (VENTOLIN HFA) 108 (90 Base) MCG/ACT inhaler 2 puffs every 6 hours as needed for wheezing   alum & mag hydroxide-simeth (MAALOX MAX) 400-400-40 MG/5ML suspension Take 5 mLs by mouth every 6 (six) hours as needed for indigestion.   amLODipine (NORVASC) 5 MG tablet TAKE 1 TABLET(5 MG) BY MOUTH EVERY MORNING   aspirin 81 MG chewable tablet 1 tab by mouth daily with morning meal   atorvastatin (LIPITOR) 40 MG tablet 1 tab by mouth with evening meal.   ATROVENT HFA 17 MCG/ACT inhaler Inhale 1 puff into the lungs.   carvedilol (COREG) 25 MG tablet TAKE 1 TABLET(25 MG) BY MOUTH TWICE DAILY WITH A MEAL   empagliflozin (JARDIANCE) 10 MG TABS tablet Take 1 tablet (10 mg total) by mouth daily before breakfast.   famotidine (PEPCID) 40 MG tablet Take 40 mg by mouth at bedtime.   furosemide (LASIX) 40 MG tablet 1 tab by mouth with morning meal   isosorbide mononitrate (IMDUR) 60 MG 24 hr tablet TAKE 1 TABLET(60 MG) BY MOUTH DAILY   loratadine (CLARITIN) 10 MG tablet 1 tab by mouth every morning for allergies   potassium chloride SA (KLOR-CON) 20 MEQ tablet Take 20 mEq by mouth daily.   Vitamin D, Ergocalciferol, 50000 units CAPS Take 1 capsule by mouth once a week. On Fridays with evening meal     PAST MEDICAL HISTORY: Past Medical History:  Diagnosis Date   Asthma    CHF (congestive heart failure) (HCC)    Chronic kidney disease    Coronary artery disease    Hyperlipidemia  Hypertension    Primary hypertension 12/28/2020   Stroke [facility])     PAST SURGICAL HISTORY: Past Surgical History:  Procedure Laterality Date   ANKLE ARTHODESIS W/ ARTHROSCOPY Left    APPENDECTOMY     CARDIAC VALVE REPLACEMENT     CORONARY ARTERY BYPASS GRAFT  2019    FAMILY HISTORY: The patient family history includes Early death in his sister; Epilepsy in his sister; Hypertension in his brother, brother, and mother; Kidney disease in his mother; Seizures in his  sister.  SOCIAL HISTORY:  The patient  reports that he quit smoking about 4 years ago. His smoking use included cigarettes. He has a 3.00 pack-year smoking history. He has never used smokeless tobacco. He reports current alcohol use. He reports that he does not use drugs.  REVIEW OF SYSTEMS: Review of Systems  Constitutional: Negative for malaise/fatigue.  Cardiovascular:  Positive for dyspnea on exertion (Chronic and stable). Negative for chest pain, leg swelling, near-syncope, orthopnea, palpitations, paroxysmal nocturnal dyspnea and syncope.  Respiratory:  Positive for shortness of breath (chronic and stable).   Neurological:  Negative for dizziness and vertigo.   PHYSICAL EXAM:    03/23/2022    2:13 PM 03/23/2022    2:11 PM 02/09/2022   10:33 AM  Vitals with BMI  Height  _0  _1   Weight  261 lbs 264 lbs 13 oz  BMI  56.25 63.89  Systolic 373 428 768  Diastolic 91 115 75  Pulse 53 71 54   CONSTITUTIONAL: Appears older than stated age, hemodynamically stable, no acute distress, ambulates with a cane.  SKIN: Skin is warm and dry. No rash noted. No cyanosis. No pallor. No jaundice HEAD: Normocephalic and atraumatic.  EYES: No scleral icterus.  Arcus senilis MOUTH/THROAT: Moist oral membranes.  NECK: No JVD present. No thyromegaly noted. CHEST Normal respiratory effort. No intercostal retractions.  Sternotomy site is clean dry and intact LUNGS: Clear to auscultation bilaterally.  No stridor. No wheezes. No rales.  CARDIOVASCULAR: Regular rate and rhythm, positive B2-I2, soft holosystolic murmur heard at the apex, no gallops or rubs. ABDOMINAL: Obese, soft, nontender, nondistended, positive bowel sounds in all 4 quadrants, no apparent ascites.  EXTREMITIES: No peripheral edema, 2+ posterior tibial pulses bilaterally. HEMATOLOGIC: No significant bruising NEUROLOGIC: Oriented to person, place, and time.  5+ right upper and lower extremity strength, 3+ left upper and lower extremity  strength. Normal muscle tone.  PSYCHIATRIC: Normal mood and affect. Normal behavior. Cooperative  CARDIAC DATABASE: EKG: 02/28/2021: Normal sinus rhythm, 72 bpm, left axis, left anterior fascicular block, poor R wave progression, nonspecific ST-T changes in the high lateral leads cannot rule out ischemia.  Without underlying injury pattern.  No prior ECGs available for comparison.  09/08/2021: Normal sinus rhythm with sinus arrhythmia, 62 bpm, left axis, left anterior fascicular block.  02/09/2022: Sinus  Bradycardia, 58bpm, first degree A-V block, left axis, LAFB, nonspecific T-abnormality.  Echocardiogram: 11/2019 [facility]: LVEF 35%, moderate/severe concentric hypertrophy, moderately reduced global left ventricular systolic function, bioprosthetic mitral valve well-seated, normal position.  Mild TR.  Mild LAE.  03/11/2021: LVEF 45-50%, global hypokinesis, moderate LVH, unable to comment on diastolic function due to mitral valve replacement, RV systolic function reduced (RV S' 7cm/sec and TAPSE 1.47cm), RV size mildly enlarged, normal PASP, mild LAE, bioprosthetic mitral valve in situ, well-seated, normal structure and function.  RAP 8 mmHg.    LABORATORY DATA:    Latest Ref Rng & Units 02/01/2022   10:58 PM 12/23/2021  6:30 AM 07/21/2021    1:21 AM  CBC  WBC 4.0 - 10.5 K/uL 7.7  6.1  6.8   Hemoglobin 13.0 - 17.0 g/dL 14.0  14.1  13.6   Hematocrit 39.0 - 52.0 % 41.6  43.8  38.4   Platelets 150 - 400 K/uL 247  217  271        Latest Ref Rng & Units 02/01/2022   10:58 PM 12/23/2021    6:30 AM 07/21/2021    1:21 AM  CMP  Glucose 70 - 99 mg/dL 80  116  119   BUN 6 - 20 mg/dL _0 Creatinine 0.61 - 1.24 mg/dL 2.52  2.02  2.41   Sodium 135 - 145 mmol/L 142  139  139   Potassium 3.5 - 5.1 mmol/L 3.2  3.1  3.0   Chloride 98 - 111 mmol/L 104  105  104   CO2 22 - 32 mmol/L _1 Calcium 8.9 - 10.3 mg/dL 9.2  9.0  8.9     Lipid Panel  No results found for:  "CHOL", "TRIG", "HDL", "CHOLHDL", "VLDL", "LDLCALC", "LDLDIRECT", "LABVLDL"  No components found for: "NTPROBNP" No results for input(s): "PROBNP" in the last 8760 hours. No results for input(s): "TSH" in the last 8760 hours.   BMP Recent Labs    07/21/21 0121 12/23/21 0630 02/01/22 2258  NA 139 139 142  K 3.0* 3.1* 3.2*  CL 104 105 104  CO2 _2 GLUCOSE 119* 116* 80  BUN 28* 18 28*  CREATININE 2.41* 2.02* 2.52*  CALCIUM 8.9 9.0 9.2  GFRNONAA 32* 40* 30*    HEMOGLOBIN A1C Lab Results  Component Value Date   HGBA1C 5.2 05/02/2021   External Labs: Collected: 12/14/2021 by nephrology. BUN 24, creatinine 2.2, EGFR 36. Sodium 141, potassium 3.7, chloride 102, bicarb 30. Magnesium 1.9. Hemoglobin 14.5 g/dL. Random protein to creatinine ratio 309 mg/g Cr with   IMPRESSION:    ICD-10-CM   1. Chronic combined systolic and diastolic heart failure (HCC)  I50.42 ENTRESTO 49-51 MG    empagliflozin (JARDIANCE) 10 MG TABS tablet    Basic metabolic panel    Magnesium    2. Atherosclerosis of native coronary artery of native heart without angina pectoris  I25.10     3. History of mitral valve replacement with bioprosthetic valve  Z95.3     4. Benign hypertension with chronic kidney disease, stage III (HCC)  I12.9    N18.30     5. Mixed hyperlipidemia  E78.2     6. History of hemorrhagic stroke with residual hemiparesis [facility])  I69.359        RECOMMENDATIONS: John Price is a 51 y.o. 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  Chronic systolic heart failure (HCC) Euvolemic. No recent hospitalizations for CHF Echo 02/2021: LVEF 45-50%, bioprosthetic  mitral valve in situ. Medications reconciled. We will enroll him into PCM.  Patient assistance for Delene Loll provided to the patient. Start Jardiance 10 mg p.o. daily samples and assistance card provided as well. Labs in 2 weeks after starting Jardiance evaluate kidney function and electrolytes Currently follows up with Dr. Royce Macadamia given his underlying CKD.    Precordial pain Resolved  since last office visit. Had recommended stress test but patient forgot. The shared decision was to hold off on further ischemic evaluation as he is currently asymptomatic. Monitor for now.  History of mitral valve replacement with bioprosthetic valve Last echo August 2022. Reemphasized importance of endocarditis prophylaxis. Continue aspirin 81 mg p.o. daily Monitor for now  Benign hypertension with chronic kidney disease, stage III ([place]) Office blood pressures are well controlled. Medications reconciled.  Mixed hyperlipidemia Currently on atorvastatin.   He denies myalgia or other side effects. Currently managed by primary care provider.  History of hemorrhagic stroke with residual hemiparesis ([place]) Reemphasized importance of secondary prevention  FINAL MEDICATION LIST END OF ENCOUNTER:   Current Outpatient Medications:    albuterol (VENTOLIN HFA) 108 (90 Base) MCG/ACT inhaler, 2 puffs every 6 hours as needed for wheezing, Disp: 18 g, Rfl: 1   alum & mag hydroxide-simeth (MAALOX MAX) 400-400-40 MG/5ML suspension, Take 5 mLs by mouth every 6 (six) hours as needed for indigestion., Disp: 355 mL, Rfl: 0   amLODipine (NORVASC) 5 MG tablet, TAKE 1 TABLET(5 MG) BY MOUTH EVERY MORNING, Disp: 90 tablet, Rfl: 2   aspirin 81 MG chewable tablet, 1 tab by mouth daily with morning meal, Disp: 30 tablet, Rfl: 11   atorvastatin (LIPITOR) 40 MG tablet, 1 tab by mouth with evening meal., Disp: 30 tablet, Rfl: 11   ATROVENT HFA 17 MCG/ACT inhaler, Inhale 1 puff into the lungs., Disp: , Rfl:    carvedilol (COREG)  25 MG tablet, TAKE 1 TABLET(25 MG) BY MOUTH TWICE DAILY WITH A MEAL, Disp: 180 tablet, Rfl: 0   empagliflozin (JARDIANCE) 10 MG TABS tablet, Take 1 tablet (10 mg total) by mouth daily before breakfast., Disp: 90 tablet, Rfl: 0   famotidine (PEPCID) 40 MG tablet, Take 40 mg by mouth at bedtime., Disp: , Rfl:    furosemide (LASIX) 40 MG tablet, 1 tab by mouth with morning meal, Disp: 30 tablet, Rfl: 11   isosorbide mononitrate (IMDUR) 60 MG 24 hr tablet, TAKE 1 TABLET(60 MG) BY MOUTH DAILY, Disp: 90 tablet, Rfl: 1   loratadine (CLARITIN) 10 MG tablet, 1 tab by mouth every morning for allergies, Disp: 30 tablet, Rfl: 11   potassium chloride SA (KLOR-CON) 20 MEQ tablet, Take 20 mEq by mouth daily., Disp: , Rfl:    Vitamin D, Ergocalciferol, 50000 units CAPS, Take 1 capsule by mouth once a week. On Fridays with evening meal, Disp: 4 capsule, Rfl: 11   ENTRESTO 49-51 MG, 1 tab by mouth twice daily with meals, Disp: 60 tablet, Rfl: 11   NON FORMULARY, Take 1 tablet by mouth daily in the afternoon. Vertigo nausea gummies (Patient not taking: Reported on 03/23/2022), Disp: , Rfl:    NON FORMULARY, Take 1 tablet by mouth daily at 12 noon. Claritox Pro (Patient not taking: Reported on 03/23/2022), Disp: , Rfl:   Orders Placed This Encounter  Procedures   Basic metabolic panel   Magnesium    There are no Patient Instructions on file for this visit.   --Continue cardiac medications as reconciled in final medication list. --Return in about 6 months (around 09/23/2022) for Follow up, heart failure management.. Or sooner if needed. --Continue follow-up with your primary care physician regarding the management of your other chronic comorbid conditions.  Patient's questions and concerns were addressed to his satisfaction. He voices understanding of the instructions provided during this encounter.   This note was created using a voice recognition software as a result there  may be grammatical errors inadvertently  enclosed that do not reflect the nature of this encounter. Every attempt is made to correct such errors.  Rex Kras, [place], [facility]  Pager: [phone] Office: [phone]

## 2022-04-06 ENCOUNTER — Other Ambulatory Visit: Payer: Self-pay

## 2022-04-06 ENCOUNTER — Emergency Department (HOSPITAL_COMMUNITY)
Admission: EM | Admit: 2022-04-06 | Discharge: 2022-04-07 | Disposition: A | Discharge status: Home / Self Care | Payer: Medicaid Other | Attending: Emergency Medicine | Admitting: Emergency Medicine

## 2022-04-06 ENCOUNTER — Emergency Department (HOSPITAL_COMMUNITY): Payer: Medicaid Other

## 2022-04-06 ENCOUNTER — Encounter (HOSPITAL_COMMUNITY): Payer: Self-pay | Admitting: Emergency Medicine

## 2022-04-06 DIAGNOSIS — I509 Heart failure, unspecified: Secondary | ICD-10-CM | POA: Diagnosis not present

## 2022-04-06 DIAGNOSIS — R0789 Other chest pain: Secondary | ICD-10-CM | POA: Insufficient documentation

## 2022-04-06 DIAGNOSIS — R0602 Shortness of breath: Secondary | ICD-10-CM | POA: Diagnosis present

## 2022-04-06 DIAGNOSIS — I110 Hypertensive heart disease with heart failure: Secondary | ICD-10-CM | POA: Insufficient documentation

## 2022-04-06 DIAGNOSIS — Z7982 Long term (current) use of aspirin: Secondary | ICD-10-CM | POA: Diagnosis not present

## 2022-04-06 LAB — D-DIMER, QUANTITATIVE: D-Dimer, Quant: 0.34 ug/mL-FEU (ref 0.00–0.50)

## 2022-04-06 LAB — CBC WITH DIFFERENTIAL/PLATELET
Abs Immature Granulocytes: 0.01 10*3/uL (ref 0.00–0.07)
Basophils Absolute: 0 10*3/uL (ref 0.0–0.1)
Basophils Relative: 1 %
Eosinophils Absolute: 0.4 10*3/uL (ref 0.0–0.5)
Eosinophils Relative: 7 %
HCT: 38 % — ABNORMAL LOW (ref 39.0–52.0)
Hemoglobin: 13.6 g/dL (ref 13.0–17.0)
Immature Granulocytes: 0 %
Lymphocytes Relative: 52 %
Lymphs Abs: 3 10*3/uL (ref 0.7–4.0)
MCH: 33.5 pg (ref 26.0–34.0)
MCHC: 35.8 g/dL (ref 30.0–36.0)
MCV: 93.6 fL (ref 80.0–100.0)
Monocytes Absolute: 0.4 10*3/uL (ref 0.1–1.0)
Monocytes Relative: 7 %
Neutro Abs: 1.9 10*3/uL (ref 1.7–7.7)
Neutrophils Relative %: 33 %
Platelets: 186 10*3/uL (ref 150–400)
RBC: 4.06 MIL/uL — ABNORMAL LOW (ref 4.22–5.81)
RDW: 12.2 % (ref 11.5–15.5)
WBC: 5.7 10*3/uL (ref 4.0–10.5)
nRBC: 0 % (ref 0.0–0.2)

## 2022-04-06 LAB — BASIC METABOLIC PANEL
Anion gap: 12 (ref 5–15)
BUN: 17 mg/dL (ref 6–20)
CO2: 23 mmol/L (ref 22–32)
Calcium: 9.1 mg/dL (ref 8.9–10.3)
Chloride: 106 mmol/L (ref 98–111)
Creatinine, Ser: 2.1 mg/dL — ABNORMAL HIGH (ref 0.61–1.24)
GFR, Estimated: 38 mL/min — ABNORMAL LOW (ref >60)
Glucose, Bld: 116 mg/dL — ABNORMAL HIGH (ref 70–99)
Potassium: 3.4 mmol/L — ABNORMAL LOW (ref 3.5–5.1)
Sodium: 141 mmol/L (ref 135–145)

## 2022-04-06 LAB — TROPONIN I (HIGH SENSITIVITY): Troponin I (High Sensitivity): 27 ng/L — ABNORMAL HIGH (ref <18)

## 2022-04-06 NOTE — ED Triage Notes (Signed)
Pt bib gcems from home for sudden onset of shortness of breath while lying in bed today. Ems noted wheezing and diminished lung sounds. 1 duoneb given PTA. L sided weakness from previous stroke.   BP 140/68, HR 80, Spo2 92% RA

## 2022-04-06 NOTE — ED Provider Notes (Signed)
[facility] EMERGENCY DEPARTMENT Provider Note   CSN: [id number] Arrival date & time: 04/06/22  2149     History  Chief Complaint  Patient presents with   Shortness of Breath    John Price is a 51 y.o. male.  The history is provided by the patient, medical records and the EMS personnel. No language interpreter was used.  Shortness of Breath Context: activity      51 year old male significant history of hypertension, CHF, chronic kidney disease, prior stroke affecting left side brought here via EMS with complaints of shortness of breath.  Pt report having a sensation of impending doom and having shortness of breath that started earlier today.  He felt an increase phlegm build up in his throat and having to clear his throat.  He endorse increase belching.  Also report a tinge of sharp pain to midsternal region lasting for a few second.  He does not endorse fever, chills, cough, abd pain, back pain, new numbness or weakness.  No specific treatment tried.    Home Medications Prior to Admission medications   Medication Sig Start Date End Date Taking? Authorizing Provider  albuterol (VENTOLIN HFA) 108 (90 Base) MCG/ACT inhaler 2 puffs every 6 hours as needed for wheezing 05/02/21   Julieanne Manson, MD  alum & mag hydroxide-simeth (MAALOX MAX) 400-400-40 MG/5ML suspension Take 5 mLs by mouth every 6 (six) hours as needed for indigestion. 10/23/21   Horton, Clabe Seal, DO  amLODipine (NORVASC) 5 MG tablet TAKE 1 TABLET(5 MG) BY MOUTH EVERY MORNING 06/06/21   Tolia, Sunit, DO  aspirin 81 MG chewable tablet 1 tab by mouth daily with morning meal 05/02/21   Julieanne Manson, MD  atorvastatin (LIPITOR) 40 MG tablet 1 tab by mouth with evening meal. 05/02/21   Julieanne Manson, MD  ATROVENT HFA 17 MCG/ACT inhaler Inhale 1 puff into the lungs. 06/10/21   [provider]  carvedilol (COREG) 25 MG tablet TAKE 1 TABLET(25 MG) BY MOUTH TWICE DAILY WITH A MEAL  06/14/21   Tolia, Sunit, DO  empagliflozin (JARDIANCE) 10 MG TABS tablet Take 1 tablet (10 mg total) by mouth daily before breakfast. 03/23/22 06/21/22  Tolia, Sunit, DO  ENTRESTO 49-51 MG 1 tab by mouth twice daily with meals 03/23/22   Tolia, Sunit, DO  famotidine (PEPCID) 40 MG tablet Take 40 mg by mouth at bedtime. 12/06/21   [provider]  furosemide (LASIX) 40 MG tablet 1 tab by mouth with morning meal 05/02/21   Julieanne Manson, MD  isosorbide mononitrate (IMDUR) 60 MG 24 hr tablet TAKE 1 TABLET(60 MG) BY MOUTH DAILY 11/30/21   Tolia, Sunit, DO  loratadine (CLARITIN) 10 MG tablet 1 tab by mouth every morning for allergies 05/02/21   Julieanne Manson, MD  NON FORMULARY Take 1 tablet by mouth daily in the afternoon. Vertigo nausea gummies Patient not taking: Reported on 03/23/2022    [provider]  NON FORMULARY Take 1 tablet by mouth daily at 12 noon. Claritox Pro Patient not taking: Reported on 03/23/2022    [provider]  potassium chloride SA (KLOR-CON) 20 MEQ tablet Take 20 mEq by mouth daily. 03/31/21   [provider]  Vitamin D, Ergocalciferol, 50000 units CAPS Take 1 capsule by mouth once a week. On Fridays with evening meal 05/02/21   Julieanne Manson, MD      Allergies    Gabapentin    Review of Systems   Review of Systems  Respiratory:  Positive  for shortness of breath.   All other systems reviewed and are negative.   Physical Exam Updated Vital Signs BP (!) 147/82   Pulse 60   Temp 97.9 F (36.6 C) (Oral)   Resp 18   Ht 5\' 8"  (1.727 m)   Wt 110 kg   SpO2 94%   BMI 36.87 kg/m  Physical Exam Vitals and nursing note reviewed.  Constitutional:      General: He is not in acute distress.    Appearance: He is well-developed.     Interventions: He is not intubated. HENT:     Head: Atraumatic.     Mouth/Throat:     Mouth: Mucous membranes are moist.  Eyes:     Conjunctiva/sclera: Conjunctivae normal.  Cardiovascular:      Rate and Rhythm: Normal rate and regular rhythm.  Pulmonary:     Effort: Pulmonary effort is normal. No accessory muscle usage or respiratory distress. He is not intubated.     Breath sounds: Normal breath sounds. No decreased breath sounds, wheezing, rhonchi or rales.  Abdominal:     Palpations: Abdomen is soft.     Tenderness: There is no abdominal tenderness.  Musculoskeletal:     Cervical back: Normal range of motion and neck supple.     Comments: 5/5 strength RUE, 4/5 strength LUE 5/5 strength LLE, 5/5 strength LLE  Skin:    Findings: No rash.  Neurological:     Mental Status: He is alert. Mental status is at baseline.    ED Results / Procedures / Treatments   Labs (all labs ordered are listed, but only abnormal results are displayed) Labs Reviewed  BASIC METABOLIC PANEL - Abnormal; Notable for the following components:      Result Value   Potassium 3.4 (*)    Glucose, Bld 116 (*)    Creatinine, Ser 2.10 (*)    GFR, Estimated 38 (*)    All other components within normal limits  CBC WITH DIFFERENTIAL/PLATELET - Abnormal; Notable for the following components:   RBC 4.06 (*)    HCT 38.0 (*)    All other components within normal limits  TROPONIN I (HIGH SENSITIVITY) - Abnormal; Notable for the following components:   Troponin I (High Sensitivity) 27 (*)    All other components within normal limits  TROPONIN I (HIGH SENSITIVITY) - Abnormal; Notable for the following components:   Troponin I (High Sensitivity) 24 (*)    All other components within normal limits  D-DIMER, QUANTITATIVE  BRAIN NATRIURETIC PEPTIDE    EKG EKG Interpretation  Date/Time:  Friday April 07 2022 00:22:32 EDT Ventricular Rate:  58 PR Interval:  213 QRS Duration: 101 QT Interval:  422 QTC Calculation: 415 R Axis:   -77 Text Interpretation: Sinus rhythm Ventricular premature complex Prolonged PR interval Left anterior fascicular block Abnormal R-wave progression, late transition Borderline T  abnormalities, lateral leads Confirmed by 10-27-1999 (Zadie Rhine) on 04/07/2022 12:35:53 AM  Radiology DG CHEST PORT 1 VIEW  Result Date: 04/06/2022 CLINICAL DATA:  Shortness of breath EXAM: PORTABLE CHEST 1 VIEW COMPARISON:  02/01/2022 FINDINGS: Post sternotomy changes. Mild cardiomegaly. No acute airspace disease or effusion. No pneumothorax IMPRESSION: No active disease.  Borderline to mild cardiomegaly Electronically Signed   By: 04/04/2022 M.D.   On: 04/06/2022 23:56    Procedures Procedures    Medications Ordered in ED Medications - No data to display  ED Course/ Medical Decision Making/ A&P  Medical Decision Making Amount and/or Complexity of Data Reviewed Labs: ordered. Radiology: ordered.   BP (!) 147/82   Pulse 60   Temp 97.9 F (36.6 C) (Oral)   Resp 18   Ht 5\' 8"  (1.727 m)   Wt 110 kg   SpO2 94%   BMI 36.87 kg/m   76:86 PM 51 year old male with multiple co morbidities which includes hypertension, CHF, CAD, prior stroke with left-sided weakness, presenting with complaints of shortness of breath.  Patient reports increasing shortness of breath as well as a sense of impending doom. Endorse excessive belching and having to clear his throat from phlegm. This started earlier today.  At this time he is without any complaint except having to belch and clearing his throat.  Patient is a poor story and.  On exam patient is resting comfortably appears to be in no acute discomfort.  He does not have any appreciable focal neurodeficit except for weakness of his left upper extremity which is similar to prior from his prior stroke.  He also does have diminished visual field to his peripheral more significant to left lower this is also resulted in some of his prior stroke.  He has a surgical scar to his mid chest without any significant tenderness to palpation.  Abdomen is soft nontender.  He is mentating appropriately.  2:01 AM Labs, EKG, and imaging  independently viewed interpreted by me and I agree with radiologist interpretation.  Initial troponin is 27, repeat troponin is 24.  Doubt ACS.  Negative D-dimer, doubt PE.  Normal BNP, doubt CHF exacerbation.  Normal WBC, normal H&H, electrolytes at baseline with improved renal function compared to prior.  On reassessment, patient is resting comfortably appears to be in no acute discomfort.  I encouraged patient to follow-up outpatient with his cardiologist as patient may benefit from a heart catheterization to rule out cardiac disease.  Return precaution given.  Care discussed with Dr. 54.  This patient presents to the ED for concern of chest pain/shortness of breath, this involves an extensive number of treatment options, and is a complaint that carries with it a high risk of complications and morbidity.  The differential diagnosis includes acs, pe, pna, chf, anemia, ptx  Co morbidities that complicate the patient evaluation stroke  Htn  chf Additional history obtained:  Additional history obtained from pt External records from outside source obtained and reviewed including EMR including prior labs and imaging  Lab Tests:  I Ordered, and personally interpreted labs.  The pertinent results include:  as above  Imaging Studies ordered:  I ordered imaging studies including CXR I independently visualized and interpreted imaging which showed no acute finding I agree with the radiologist interpretation  Cardiac Monitoring:  The patient was maintained on a cardiac monitor.  I personally viewed and interpreted the cardiac monitored which showed an underlying rhythm of: sinus bradycardia   Test Considered: as above  Critical Interventions: none  Consultations Obtained:  I requested consultation with the attending Dr. Bebe Shaggy,  and discussed lab and imaging findings as well as pertinent plan - they recommend: outpt f/u with cardiology  Problem List / ED  Course: cp  sob  Reevaluation:  After the interventions noted above, I reevaluated the patient and found that they have :improved  Social Determinants of Health: tobacco use  Dispostion:  After consideration of the diagnostic results and the patients response to treatment, I feel that the patent would benefit from outpt f/u.  Final Clinical Impression(s) / ED Diagnoses Final diagnoses:  Shortness of breath  Atypical chest pain    Rx / DC Orders ED Discharge Orders     None         Fayrene Helper, PA-C 04/07/22 0207    Zadie Rhine, MD 04/07/22 0500

## 2022-04-07 LAB — BRAIN NATRIURETIC PEPTIDE: B Natriuretic Peptide: 17.3 pg/mL (ref 0.0–100.0)

## 2022-04-07 LAB — TROPONIN I (HIGH SENSITIVITY): Troponin I (High Sensitivity): 24 ng/L — ABNORMAL HIGH (ref <18)

## 2022-04-07 NOTE — ED Notes (Signed)
Patient verbalizes understanding of discharge instructions. Opportunity for questioning and answers were provided. Armband removed by staff, pt discharged from ED.  

## 2022-04-07 NOTE — Discharge Instructions (Signed)
You have been evaluated for your chest pain and trouble breathing.  Fortunately no concerning findings were noted during today's exam.  However, please consider call and follow-up with your cardiologist for outpatient evaluation which may include any a heart catheterization to ensure you do not have any evidence of coronary vessel disease.  Follow-up with your doctor for further care.  Return if you have any concern.

## 2022-04-10 ENCOUNTER — Other Ambulatory Visit: Payer: Self-pay | Admitting: Cardiology

## 2022-04-10 ENCOUNTER — Other Ambulatory Visit: Payer: Self-pay

## 2022-04-10 MED ORDER — ATORVASTATIN CALCIUM 40 MG PO TABS
ORAL_TABLET | ORAL | 11 refills | Status: DC
Start: 2022-04-10 — End: 2023-07-09

## 2022-04-10 NOTE — Telephone Encounter (Signed)
Refill request for Atorvastatin

## 2022-05-04 ENCOUNTER — Other Ambulatory Visit: Payer: Self-pay | Admitting: Internal Medicine

## 2022-05-19 ENCOUNTER — Ambulatory Visit: Payer: Medicaid Other | Admitting: Allergy

## 2022-06-05 ENCOUNTER — Other Ambulatory Visit: Payer: Medicaid Other

## 2022-06-06 ENCOUNTER — Ambulatory Visit: Payer: Medicaid Other

## 2022-06-06 DIAGNOSIS — Z951 Presence of aortocoronary bypass graft: Secondary | ICD-10-CM

## 2022-06-06 DIAGNOSIS — Z953 Presence of xenogenic heart valve: Secondary | ICD-10-CM

## 2022-06-06 DIAGNOSIS — I5022 Chronic systolic (congestive) heart failure: Secondary | ICD-10-CM

## 2022-06-07 ENCOUNTER — Other Ambulatory Visit: Payer: Self-pay

## 2022-06-07 DIAGNOSIS — R072 Precordial pain: Secondary | ICD-10-CM

## 2022-06-07 DIAGNOSIS — I10 Essential (primary) hypertension: Secondary | ICD-10-CM

## 2022-06-07 MED ORDER — ISOSORBIDE MONONITRATE ER 60 MG PO TB24: 60.0000 mg | ORAL_TABLET | Freq: Every day | ORAL | 0 refills | Status: DC

## 2022-06-21 ENCOUNTER — Ambulatory Visit: Payer: Medicaid Other | Admitting: Allergy

## 2022-06-21 ENCOUNTER — Ambulatory Visit: Payer: Medicaid Other | Admitting: Cardiology

## 2022-06-28 ENCOUNTER — Encounter: Payer: Self-pay | Admitting: Allergy

## 2022-06-28 ENCOUNTER — Ambulatory Visit (INDEPENDENT_AMBULATORY_CARE_PROVIDER_SITE_OTHER): Payer: Medicaid Other | Admitting: Allergy

## 2022-06-28 VITALS — BP 122/70 | HR 64 | Temp 98.9°F | Ht 67.0 in | Wt 264.0 lb

## 2022-06-28 DIAGNOSIS — J3089 Other allergic rhinitis: Secondary | ICD-10-CM

## 2022-06-28 DIAGNOSIS — R0982 Postnasal drip: Secondary | ICD-10-CM

## 2022-06-28 DIAGNOSIS — J4530 Mild persistent asthma, uncomplicated: Secondary | ICD-10-CM | POA: Diagnosis not present

## 2022-06-28 MED ORDER — IPRATROPIUM BROMIDE 0.06 % NA SOLN: NASAL | 5 refills | Status: DC

## 2022-06-28 MED ORDER — CETIRIZINE HCL 10 MG PO TABS: 10.0000 mg | ORAL_TABLET | Freq: Every day | ORAL | 5 refills | Status: DC

## 2022-06-28 MED ORDER — BUDESONIDE-FORMOTEROL FUMARATE 160-4.5 MCG/ACT IN AERO: 2.0000 | INHALATION_SPRAY | Freq: Two times a day (BID) | RESPIRATORY_TRACT | 5 refills | Status: DC

## 2022-06-28 NOTE — Patient Instructions (Signed)
-   Testing today showed: dust mites and cockroach. - Copy of test results provided.  - Avoidance measures provided. - Stop taking: Loratadine over time can become less effective - Start taking: Zyrtec (cetirizine) 10mg  tablet once daily.  This replaces Loratadine.  Atrovent (ipratropium) 0.06% 1-2 spray per nostril 3-4 times daily as needed for nasal drainage or congestion - You can use an extra dose of the antihistamine, if needed, for breakthrough symptoms.  - Continue to perform nasal saline rinses  - Rescue medications: albuterol 2 puffs every 4-6 hours as needed OR can use Symbicort 2 puffs as needed up to 10 puffs per day of Symbicort if needed - Changes during respiratory infections or worsening symptoms: Add on Symbicort 2 puffs twice daily for TWO WEEKS.  - Asthma control goals:  * Full participation in all desired activities (may need albuterol before activity) * Albuterol use two time or less a week on average (not counting use with activity) * Cough interfering with sleep two time or less a month * Oral steroids no more than once a year * No hospitalizations  Follow-up in 4-6 months or sooner if needed

## 2022-06-28 NOTE — Progress Notes (Signed)
New Patient Note  RE: John Price MRN: [id number] DOB: Oct 25, 1970 Date of Office Visit: 06/28/2022  Primary care provider: Eliezer Lofts, MD  Chief Complaint: issues with talking  History of present illness: John Price is a 51 y.o. male presenting today for evaluation of allergic rhinitis.  He has history of hypertension, CHF and chronic kidney disease with previous history of stroke.  He states he is always throat clearing.  Occasional stuffy nose.  Lately has been sneezing a lot.  He states he is used to having itchy/watery eyes.  He does take loratadine daily for past year and not sure if it is doing anything.  He states he does feel like he is straining to talk and like there is something in the throat.  He feels like he has been having this issue since 2018 after he had mitral valve repair.  He states he has used vicks spray for nose and has also used a nasal saline spray before.   He states when he was younger he states was allergic to pollen and dust and does not recall if he was ever tested.    He has history of asthma and has albuterol inhaler.  He does follow  with a pulmonologist.  Pt states he was told to stop the daily inhaler he had in the past, believes it was maybe asmanex, however states was told to stop it as it was medication for COPD.   He has symbicort that he uses now as needed.   No history of eczema or food allergy.  Review of systems: Review of Systems  Constitutional: Negative.   HENT:         See HPI  Eyes: Negative.   Respiratory: Negative.    Cardiovascular: Negative.   Musculoskeletal: Negative.   Skin: Negative.   Allergic/Immunologic: Negative.   Neurological: Negative.     All other systems negative unless noted above in HPI  Past medical history: Past Medical History:  Diagnosis Date   Asthma    CHF (congestive heart failure) (HCC)    Chronic kidney disease    Coronary artery disease    Hyperlipidemia    Hypertension     Primary hypertension 12/28/2020   Stroke [facility])     Past surgical history: Past Surgical History:  Procedure Laterality Date   ANKLE ARTHODESIS W/ ARTHROSCOPY Left    APPENDECTOMY     CARDIAC VALVE REPLACEMENT     CORONARY ARTERY BYPASS GRAFT  2019    Family history:  Family History  Problem Relation Age of Onset   Allergic rhinitis Mother    Hypertension Mother    Kidney disease Mother    Epilepsy Sister    Early death Sister    Seizures Sister    Hypertension Brother    Hypertension Brother    Lupus Paternal Aunt    COPD Paternal Uncle    Allergic rhinitis Cousin    Asthma Cousin    Eczema Neg Hx    Angioedema Neg Hx     Social history: Lives in a home without carpeting with electric heating and central cooling.  No pets in the home.  There is concern for water damage, mildew and roaches in the home. He is disabled.  Tobacco Use   Smoking status: Former    Packs/day: 0.25    Years: 12.00    Total pack years: 3.00    Types: Cigarettes    Quit date: 11/28/2017    Years  since quitting: 4.5   Smokeless tobacco: Never   Tobacco comments:    Only smoked when he would drink. Not anymore  Vaping Use   Vaping Use: Never used     Medication List: Current Outpatient Medications  Medication Sig Dispense Refill   albuterol (VENTOLIN HFA) 108 (90 Base) MCG/ACT inhaler 2 puffs every 6 hours as needed for wheezing 18 g 1   amLODipine (NORVASC) 5 MG tablet TAKE 1 TABLET(5 MG) BY MOUTH EVERY MORNING 90 tablet 2   aspirin 81 MG chewable tablet 1 tab by mouth daily with morning meal 30 tablet 11   atorvastatin (LIPITOR) 40 MG tablet 1 tab by mouth with evening meal. 30 tablet 11   carvedilol (COREG) 25 MG tablet TAKE 1 TABLET BY MOUTH 2 TIMES DAILY WITH MEALS 60 tablet 0   Cholecalciferol (VITAMIN D3) 1000 units CAPS Take by mouth.     ENTRESTO 49-51 MG 1 tab by mouth twice daily with meals 60 tablet 11   famotidine (PEPCID) 40 MG tablet Take 40 mg by mouth at bedtime.      furosemide (LASIX) 40 MG tablet 1 tab by mouth with morning meal 30 tablet 11   isosorbide mononitrate (IMDUR) 60 MG 24 hr tablet Take 1 tablet (60 mg total) by mouth daily. 90 tablet 0   loratadine (CLARITIN) 10 MG tablet 1 tab by mouth every morning for allergies 30 tablet 11   potassium chloride SA (KLOR-CON) 20 MEQ tablet Take 20 mEq by mouth daily.     No current facility-administered medications for this visit.    Known medication allergies: Allergies  Allergen Reactions   Gabapentin Anaphylaxis   Other     "States a narcotic made him feel like he wouldn't wake up"     Physical examination: Blood pressure 122/70, pulse 64, temperature 98.9 F (37.2 C), temperature source Temporal, height 5\' 7"  (1.702 m), weight 264 lb (119.7 kg), SpO2 95 %.  General: Alert, interactive, in no acute distress. HEENT: PERRLA, TMs pearly gray, turbinates minimally edematous without discharge, post-pharynx non erythematous. Neck: Supple without lymphadenopathy. Lungs: Clear to auscultation without wheezing, rhonchi or rales. {no increased work of breathing. CV: Normal S1, S2 without murmurs. Abdomen: Nondistended, nontender. Skin: Warm and dry, without lesions or rashes. Extremities:  No clubbing, cyanosis or edema. Neuro:   Grossly intact.  Diagnositics/Labs:  Spirometry attempted: FEV1: 1.78L 50%, FVC: 2.23L 53% predicted.   He did not meet ATS criteria  Allergy testing:   Airborne Adult Perc - 06/28/22 1428     Time Antigen Placed 1428    Allergen Manufacturer 06/30/22    Location Back    Number of Test 59    1. Control-Buffer 50% Glycerol Negative    2. Control-Histamine 1 mg/ml --   +/-   3. Albumin saline Negative    4. Bahia Negative    5. Waynette Buttery Negative    6. Johnson Negative    7. Kentucky Blue Negative    8. Meadow Fescue Negative    9. Perennial Rye Negative    10. Sweet Vernal Negative    11. Timothy Negative    12. Cocklebur Negative    13. Burweed Marshelder  Negative    14. Ragweed, short Negative    15. Ragweed, Giant Negative    16. Plantain,  English Negative    17. Lamb's Quarters Negative    18. Sheep Sorrell Negative    19. Rough Pigweed Negative    20. [place], Rough  Negative    21. Mugwort, Common Negative    22. Ash mix Negative    23. Birch mix Negative    24. Beech American Negative    25. Box, Elder Negative    26. Cedar, red Negative    27. Cottonwood, Guinea[place] Negative    28. Elm mix Negative    29. Hickory Negative    30. Maple mix Negative    31. Oak, [place] mix Negative    32. Pecan Pollen Negative    33. Pine mix Negative    34. Sycamore Eastern Negative    35. Walnut, Black Pollen Negative    36. Alternaria alternata Negative    37. Cladosporium Herbarum Negative    38. Aspergillus mix Negative    39. Penicillium mix Negative    40. Bipolaris sorokiniana (Helminthosporium) Negative    41. Drechslera spicifera (Curvularia) Negative    42. Mucor plumbeus Negative    43. Fusarium moniliforme Negative    44. Aureobasidium pullulans (pullulara) Negative    45. Rhizopus oryzae Negative    46. Botrytis cinera Negative    47. Epicoccum nigrum Negative    48. Phoma betae Negative    49. Candida Albicans Negative    50. Trichophyton mentagrophytes Negative    51. Mite, D Farinae  5,000 AU/ml Negative    52. Mite, D Pteronyssinus  5,000 AU/ml 4+    53. Cat Hair 10,000 BAU/ml Negative    54.  Dog Epithelia Negative    55. Mixed Feathers Negative    56. Horse Epithelia Negative    57. Cockroach, German 2+    58. Mouse Negative    59. Tobacco Leaf Negative             Allergy testing results were read and interpreted by provider, documented by clinical staff.   Assessment and plan: Allergic rhinitis with post-nasal drip  - Testing today showed: dust mites and cockroach. - Copy of test results provided.  - Avoidance measures provided. - Stop taking: Loratadine over time can become less effective -  Start taking: Zyrtec (cetirizine) 10mg  tablet once daily.  This replaces Loratadine.  Atrovent (ipratropium) 0.06% 1-2 spray per nostril 3-4 times daily as needed for nasal drainage or congestion - You can use an extra dose of the antihistamine, if needed, for breakthrough symptoms.  - Continue to perform nasal saline rinses  Asthma - Rescue medications: albuterol 2 puffs every 4-6 hours as needed OR can use Symbicort 2 puffs as needed up to 10 puffs per day of Symbicort if needed - Changes during respiratory infections or worsening symptoms: Add on Symbicort 2 puffs twice daily for TWO WEEKS.  - Asthma control goals:  * Full participation in all desired activities (may need albuterol before activity) * Albuterol use two time or less a week on average (not counting use with activity) * Cough interfering with sleep two time or less a month * Oral steroids no more than once a year * No hospitalizations  Follow-up in 4-6 months or sooner if needed  I appreciate the opportunity to take part in John Price's care. Please do not hesitate to contact me with questions.  Sincerely,   , MD Allergy/Immunology [facility]

## 2022-06-30 NOTE — Progress Notes (Signed)
Called patient with results. Acknowledged understanding.

## 2022-08-09 ENCOUNTER — Other Ambulatory Visit: Payer: Self-pay | Admitting: Allergy

## 2022-08-11 ENCOUNTER — Telehealth: Payer: Self-pay

## 2022-08-11 NOTE — Telephone Encounter (Signed)
Colletta Maryland James/Dr. Grant Ruts [place]'s office - called in - DOB verified - stated checking on status for patient on medication prescription for Cetirizine (Zyrtec) being sent to pharmacy.  Colletta Maryland was advised prescription was sent in on 08/10/22 to [facility].  Colletta Maryland verbalized understanding, no further questions.

## 2022-08-14 ENCOUNTER — Other Ambulatory Visit: Payer: Medicaid Other

## 2022-08-22 ENCOUNTER — Ambulatory Visit: Payer: Medicaid Other

## 2022-08-22 DIAGNOSIS — R072 Precordial pain: Secondary | ICD-10-CM

## 2022-08-23 ENCOUNTER — Other Ambulatory Visit: Payer: Self-pay | Admitting: Nephrology

## 2022-08-23 DIAGNOSIS — I5022 Chronic systolic (congestive) heart failure: Secondary | ICD-10-CM

## 2022-08-23 DIAGNOSIS — N2581 Secondary hyperparathyroidism of renal origin: Secondary | ICD-10-CM

## 2022-08-23 DIAGNOSIS — R1032 Left lower quadrant pain: Secondary | ICD-10-CM

## 2022-08-23 DIAGNOSIS — N1832 Chronic kidney disease, stage 3b: Secondary | ICD-10-CM

## 2022-08-23 DIAGNOSIS — H8113 Benign paroxysmal vertigo, bilateral: Secondary | ICD-10-CM

## 2022-08-23 DIAGNOSIS — I129 Hypertensive chronic kidney disease with stage 1 through stage 4 chronic kidney disease, or unspecified chronic kidney disease: Secondary | ICD-10-CM

## 2022-08-23 DIAGNOSIS — I25709 Atherosclerosis of coronary artery bypass graft(s), unspecified, with unspecified angina pectoris: Secondary | ICD-10-CM

## 2022-09-05 ENCOUNTER — Ambulatory Visit
Admission: RE | Admit: 2022-09-05 | Discharge: 2022-09-05 | Disposition: A | Discharge status: Home / Self Care | Payer: Medicaid Other | Source: Ambulatory Visit | Attending: Nephrology | Admitting: Nephrology

## 2022-09-05 DIAGNOSIS — I25709 Atherosclerosis of coronary artery bypass graft(s), unspecified, with unspecified angina pectoris: Secondary | ICD-10-CM

## 2022-09-05 DIAGNOSIS — I129 Hypertensive chronic kidney disease with stage 1 through stage 4 chronic kidney disease, or unspecified chronic kidney disease: Secondary | ICD-10-CM

## 2022-09-05 DIAGNOSIS — I5022 Chronic systolic (congestive) heart failure: Secondary | ICD-10-CM

## 2022-09-05 DIAGNOSIS — N1832 Chronic kidney disease, stage 3b: Secondary | ICD-10-CM

## 2022-09-05 DIAGNOSIS — H8113 Benign paroxysmal vertigo, bilateral: Secondary | ICD-10-CM

## 2022-09-05 DIAGNOSIS — N2581 Secondary hyperparathyroidism of renal origin: Secondary | ICD-10-CM

## 2022-09-05 DIAGNOSIS — R1032 Left lower quadrant pain: Secondary | ICD-10-CM

## 2022-09-15 NOTE — Progress Notes (Signed)
Spoke with patient to remind him of 2/22 appointment. Let him know results will be given at this appointment as well.

## 2022-09-21 ENCOUNTER — Ambulatory Visit: Payer: Medicaid Other | Admitting: Cardiology

## 2022-09-21 ENCOUNTER — Encounter: Payer: Self-pay | Admitting: Cardiology

## 2022-09-21 VITALS — BP 146/80 | HR 70 | Resp 18 | Ht 67.0 in | Wt 262.8 lb

## 2022-09-21 DIAGNOSIS — I69359 Hemiplegia and hemiparesis following cerebral infarction affecting unspecified side: Secondary | ICD-10-CM

## 2022-09-21 DIAGNOSIS — E782 Mixed hyperlipidemia: Secondary | ICD-10-CM

## 2022-09-21 DIAGNOSIS — I2510 Atherosclerotic heart disease of native coronary artery without angina pectoris: Secondary | ICD-10-CM

## 2022-09-21 DIAGNOSIS — I5032 Chronic diastolic (congestive) heart failure: Secondary | ICD-10-CM

## 2022-09-21 DIAGNOSIS — Z953 Presence of xenogenic heart valve: Secondary | ICD-10-CM

## 2022-09-21 DIAGNOSIS — N183 Chronic kidney disease, stage 3 unspecified: Secondary | ICD-10-CM

## 2022-09-21 MED ORDER — ENTRESTO 97-103 MG PO TABS: 1.0000 | ORAL_TABLET | Freq: Two times a day (BID) | ORAL | 0 refills | Status: AC

## 2022-09-21 NOTE — Progress Notes (Signed)
ID:  John Price, DOB March 26, 1971, MRN [id number]  PCP:  Dulce Sellar, MD  Cardiologist:  Rex Kras, DO, [facility]  (established care 02/28/2021) Nephrologist: Dr. Harrie Jeans Former Cardiology Providers: Lynne Leader (Juana Diaz)  Date: 09/21/22 Last Office Visit: 03/23/2022  Chief Complaint  Patient presents with   Congestive Heart Failure   Follow-up    6 months   HPI  John Price is a 52 y.o. 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  In 2022 patient moved to [facility] and was referred to the practice given his history of mitral valve replacement and multiple cardiovascular risk factors.  He also has a history of hemorrhagic stroke in 2018 which resulted in the left-sided weakness.  The stroke was felt to be secondary to uncontrolled hypertension.  In 2019 he underwent mitral valve replacement and since then has been on medical therapy for heart failure as well.  Since last office visit he is doing well from a cardiovascular standpoint.  He denies anginal discomfort or heart failure symptoms.  He underwent echo as well as stress test results reviewed with him in detail and noted below for further reference.  No hospitalizations or urgent care visits for cardiovascular reasons since last office visit.  He was started on Jardiance at last office visit which he is taking regularly and has not had any side effects.   FUNCTIONAL STATUS: No structured exercise program or daily routine.   ALLERGIES: Allergies  Allergen Reactions   Gabapentin Anaphylaxis   Other     "States a narcotic made him feel like he wouldn't wake up"     MEDICATION LIST PRIOR TO VISIT: Current Meds  Medication Sig   albuterol (VENTOLIN HFA) 108 (90 Base) MCG/ACT inhaler 2 puffs every 6 hours as needed for wheezing   aspirin 81 MG chewable tablet 1 tab by mouth daily with morning meal   atorvastatin (LIPITOR) 40 MG tablet 1 tab by mouth with evening meal.   budesonide-formoterol (SYMBICORT) 160-4.5 MCG/ACT inhaler Inhale 2 puffs into the lungs 2 (two) times daily.   carvedilol (COREG) 25 MG tablet TAKE 1 TABLET BY MOUTH 2 TIMES DAILY WITH MEALS   cetirizine (ZYRTEC) 10 MG tablet TAKE 1 TABLET(10 MG) BY MOUTH DAILY   Cholecalciferol (VITAMIN D3) 1000 units CAPS Take by mouth.   empagliflozin (JARDIANCE) 10 MG TABS tablet Take 10 mg by mouth daily.   famotidine (PEPCID) 40 MG tablet Take 40 mg by mouth at bedtime.   furosemide (LASIX) 40 MG tablet 1 tab by mouth with morning meal   ipratropium (ATROVENT) 0.06 % nasal spray Use 1-2 sprays per nostril 3-4 times daily as needed   isosorbide mononitrate (IMDUR) 60 MG 24 hr tablet Take 1 tablet (60 mg total) by mouth daily.   loratadine (CLARITIN) 10 MG tablet 1 tab by mouth every morning for allergies   potassium chloride SA (KLOR-CON) 20 MEQ tablet Take 20 mEq by mouth daily.   sacubitril-valsartan (ENTRESTO) 97-103 MG Take 1 tablet by mouth 2 (two) times daily.   [DISCONTINUED] amLODipine (NORVASC) 5 MG tablet TAKE 1 TABLET(5 MG) BY MOUTH EVERY MORNING   [DISCONTINUED] ENTRESTO 49-51 MG 1 tab by mouth twice daily with meals     PAST MEDICAL HISTORY:  Past Medical History:  Diagnosis Date   Asthma    CHF (congestive heart failure) (HCC)    Chronic kidney disease    Coronary artery disease    Hyperlipidemia    Hypertension    Primary hypertension 12/28/2020   Stroke [facility])     PAST SURGICAL HISTORY: Past Surgical History:  Procedure Laterality Date   ANKLE ARTHODESIS W/ ARTHROSCOPY Left    APPENDECTOMY     CARDIAC VALVE REPLACEMENT     CORONARY ARTERY BYPASS GRAFT  2019     FAMILY HISTORY: The patient family history includes Allergic rhinitis in his cousin and mother; Asthma in his cousin; COPD in his paternal uncle; Early death in his sister; Epilepsy in his sister; Hypertension in his brother, brother, and mother; Kidney disease in his mother; Lupus in his paternal aunt; Seizures in his sister.  SOCIAL HISTORY:  The patient  reports that he quit smoking about 4 years ago. His smoking use included cigarettes. He has a 3.00 pack-year smoking history. He has never been exposed to tobacco smoke. He has never used smokeless tobacco. He reports current alcohol use. He reports that he does not use drugs.  REVIEW OF SYSTEMS: Review of Systems  Constitutional: Negative for malaise/fatigue.  Cardiovascular:  Positive for dyspnea on exertion (Chronic and stable). Negative for chest pain, leg swelling, near-syncope, orthopnea, palpitations, paroxysmal nocturnal dyspnea and syncope.  Respiratory:  Positive for shortness of breath (chronic and stable).   Neurological:  Negative for dizziness and vertigo.   PHYSICAL EXAM:    09/21/2022    2:21 PM 06/28/2022    1:52 PM 04/07/2022    2:00 AM  Vitals with BMI  Height 5' 7"$  5' 7"$    Weight 262 lbs 13 oz 264 lbs   BMI 123456 [id number]   Systolic 123456 [id number]  Diastolic 80 70 93  Pulse 70 64 74   CONSTITUTIONAL: Appears older than stated age, hemodynamically stable, no acute distress, ambulates with a cane.  SKIN: Skin is warm and dry. No rash noted. No cyanosis. No pallor. No jaundice HEAD: Normocephalic and atraumatic.  EYES: No scleral icterus.  Arcus senilis MOUTH/THROAT: Moist oral membranes.  NECK: No JVD present. No thyromegaly noted. CHEST Normal respiratory effort. No intercostal retractions.  Sternotomy site is clean dry and intact LUNGS: Clear to auscultation bilaterally.  No stridor. No wheezes. No rales.  CARDIOVASCULAR: Regular rate and rhythm, positive Q000111Q, soft holosystolic murmur heard at the apex, no  gallops or rubs. ABDOMINAL: Obese, soft, nontender, nondistended, positive bowel sounds in all 4 quadrants, no apparent ascites.  EXTREMITIES: No peripheral edema, 2+ posterior tibial pulses bilaterally. HEMATOLOGIC: No significant bruising NEUROLOGIC: Oriented to person, place, and time.  5+ right upper and lower extremity strength, 3+ left upper and lower extremity strength. Normal muscle tone.  PSYCHIATRIC: Normal mood and affect. Normal behavior. Cooperative  CARDIAC DATABASE: EKG: 09/21/2022: Sinus rhythm, 74 bpm, left axis, left anterior fascicular block, nonspecific T wave abnormality.  Echocardiogram: 11/2019 [facility]: LVEF 35%, moderate/severe concentric hypertrophy, moderately reduced global left ventricular systolic function, bioprosthetic mitral valve well-seated, normal position.  Mild TR.  Mild LAE.  03/11/2021: LVEF 45-50%, global hypokinesis, moderate LVH, unable to comment on diastolic function due to mitral valve replacement, RV systolic function reduced (RV S' 7cm/sec and TAPSE 1.47cm), RV size mildly enlarged, normal PASP, mild LAE, bioprosthetic mitral valve in situ, well-seated, normal structure and function.  RAP 8 mmHg.    06/06/2022:  Normal LV systolic function with  visual EF 50-55%. Left ventricle cavity is normal in size. Severe concentric hypertrophy of the left ventricle. Normal global wall motion. Indeterminate diastolic filling pattern, elevated LAP.  Bioprosthetic mitral valve. No mitral valve regurgitation. E-wave dominant mitral inflow. Structurally normal tricuspid valve with trace regurgitation. No evidence of pulmonary hypertension. No prior available for comparison.   Lexiscan Tetrofosmin stress test 08/22/2022: 1 Day Rest/Stress protocol.  Stress EKG is non-diagnostic, as this is pharmacological stress test using Lexiscan. Small size, mild intensity, perfusion defect at the apical lateral segment, suggestive of ischemia at distal RCA/LCX  distribution.  Overall accuracy of the study is limited due to decreased trace uptake in the inferior and inferolateral segments more pronounced at rest as this is likely due to soft tissue attenuation artifact given his BMI of 41 and images performed in sitting position. Ischemia in this region cannot be excluded. No evidence of prior infarct.  Left ventricular size is dilated, LVEF 35%, mild global hypokinesis.  No prior studies for comparison  Clinical correlation is required.  Intermediate risk.  LABORATORY DATA:    Latest Ref Rng & Units 04/06/2022   10:54 PM 02/01/2022   10:58 PM 12/23/2021    6:30 AM  CBC  WBC 4.0 - 10.5 K/uL 5.7  7.7  6.1   Hemoglobin 13.0 - 17.0 g/dL 13.6  14.0  14.1   Hematocrit 39.0 - 52.0 % 38.0  41.6  43.8   Platelets 150 - 400 K/uL 186  247  217        Latest Ref Rng & Units 04/06/2022   10:54 PM 02/01/2022   10:58 PM 12/23/2021    6:30 AM  CMP  Glucose 70 - 99 mg/dL 116  80  116   BUN 6 - 20 mg/dL 17  28  18   $ Creatinine 0.61 - 1.24 mg/dL 2.10  2.52  2.02   Sodium 135 - 145 mmol/L 141  142  139   Potassium 3.5 - 5.1 mmol/L 3.4  3.2  3.1   Chloride 98 - 111 mmol/L 106  104  105   CO2 22 - 32 mmol/L 23  27  24   $ Calcium 8.9 - 10.3 mg/dL 9.1  9.2  9.0     Lipid Panel  No results found for: "CHOL", "TRIG", "HDL", "CHOLHDL", "VLDL", "LDLCALC", "LDLDIRECT", "LABVLDL"  No components found for: "NTPROBNP" No results for input(s): "PROBNP" in the last 8760 hours. No results for input(s): "TSH" in the last 8760 hours.   BMP Recent Labs    12/23/21 0630 02/01/22 2258 04/06/22 2254  NA 139 142 141  K 3.1* 3.2* 3.4*  CL 105 104 106  CO2 24 27 23  $ GLUCOSE 116* 80 116*  BUN 18 28* 17  CREATININE 2.02* 2.52* 2.10*  CALCIUM 9.0 9.2 9.1  GFRNONAA 40* 30* 38*    HEMOGLOBIN A1C Lab Results  Component Value Date   HGBA1C 5.2 05/02/2021   External Labs: Collected: 12/14/2021 by nephrology. BUN 24, creatinine 2.2, EGFR 36. Sodium 141, potassium 3.7,  chloride 102, bicarb 30. Magnesium 1.9. Hemoglobin 14.5 g/dL. Random protein to creatinine ratio 309 mg/g Cr with   IMPRESSION:    ICD-10-CM   1. Chronic diastolic heart failure (HCC)  I50.32 EKG 12-Lead    sacubitril-valsartan (ENTRESTO) 97-103 MG    Basic metabolic panel    Magnesium    Pro b natriuretic peptide (BNP)    2. Atherosclerosis of native coronary artery of native heart without angina pectoris  I25.10 EKG  12-Lead    3. History of mitral valve replacement with bioprosthetic valve  Z95.3 EKG 12-Lead    4. Benign hypertension with chronic kidney disease, stage III (HCC)  I12.9    N18.30     5. Mixed hyperlipidemia  E78.2     6. History of hemorrhagic stroke with residual hemiparesis [facility])  I69.359        RECOMMENDATIONS: John Price is a 53 y.o. 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  Chronic preserved heart failure (HCC) Euvolemic. No recent hospitalizations for CHF Echo: 05/2022: LVEF 50-55%, indeterminate diastolic filling pattern, elevated left atrial pressure Most recent nuclear stress test from January 2024: Intermediate risk.  Shared decision is to continue conservative management as he remains asymptomatic. Has done well on Jardiance. Discontinue amlodipine. Will increase Entresto 49/51 mg twice daily to 97/103 mg p.o. twice daily & labs in 1 week. Currently follows up with Dr. Royce Macadamia given his underlying CKD.    History of mitral valve replacement with bioprosthetic valve Last echo 05/2022 . Reemphasized importance of endocarditis prophylaxis. Continue aspirin 81 mg p.o. daily Monitor for now  Benign hypertension with chronic  kidney disease, stage III ([place]) Office blood pressures are well controlled. Medications reconciled.  Mixed hyperlipidemia Currently on atorvastatin.   He denies myalgia or other side effects. Currently managed by primary care provider.  History of hemorrhagic stroke with residual hemiparesis ([place]) Reemphasized importance of secondary prevention  FINAL MEDICATION LIST END OF ENCOUNTER:   Current Outpatient Medications:    albuterol (VENTOLIN HFA) 108 (90 Base) MCG/ACT inhaler, 2 puffs every 6 hours as needed for wheezing, Disp: 18 g, Rfl: 1   aspirin 81 MG chewable tablet, 1 tab by mouth daily with morning meal, Disp: 30 tablet, Rfl: 11   atorvastatin (LIPITOR) 40 MG tablet, 1 tab by mouth with evening meal., Disp: 30 tablet, Rfl: 11   budesonide-formoterol (SYMBICORT) 160-4.5 MCG/ACT inhaler, Inhale 2 puffs into the lungs 2 (two) times daily., Disp: 1 each, Rfl: 5   carvedilol (COREG) 25 MG tablet, TAKE 1 TABLET BY MOUTH 2 TIMES DAILY WITH MEALS, Disp: 60 tablet, Rfl: 0   cetirizine (ZYRTEC) 10 MG tablet, TAKE 1 TABLET(10 MG) BY MOUTH DAILY, Disp: 30 tablet, Rfl: 5   Cholecalciferol (VITAMIN D3) 1000 units CAPS, Take by mouth., Disp: , Rfl:    empagliflozin (JARDIANCE) 10 MG TABS tablet, Take 10 mg by mouth daily., Disp: , Rfl:    famotidine (PEPCID) 40 MG tablet, Take 40 mg by mouth at bedtime., Disp: , Rfl:    furosemide (LASIX) 40 MG tablet, 1 tab by mouth with morning meal, Disp: 30 tablet, Rfl: 11   ipratropium (ATROVENT) 0.06 % nasal spray, Use 1-2 sprays per nostril 3-4 times daily as needed, Disp: 15 mL, Rfl: 5   isosorbide mononitrate (IMDUR) 60 MG 24 hr tablet, Take 1 tablet (60 mg total) by mouth daily., Disp: 90 tablet, Rfl: 0   loratadine (CLARITIN) 10 MG tablet, 1 tab by mouth every morning for allergies, Disp: 30 tablet, Rfl: 11   potassium chloride SA (KLOR-CON) 20 MEQ tablet, Take 20 mEq by mouth daily., Disp: , Rfl:    sacubitril-valsartan (ENTRESTO) 97-103 MG, Take 1  tablet by mouth 2 (two) times daily., Disp: 180 tablet,  Rfl: 0  Orders Placed This Encounter  Procedures   Basic metabolic panel   Magnesium   Pro b natriuretic peptide (BNP)   EKG 12-Lead    There are no Patient Instructions on file for this visit.   --Continue cardiac medications as reconciled in final medication list. --Return in about 6 months (around 03/22/2023) for Follow up HFpEF and MV replacement. Or sooner if needed. --Continue follow-up with your primary care physician regarding the management of your other chronic comorbid conditions.  Patient's questions and concerns were addressed to his satisfaction. He voices understanding of the instructions provided during this encounter.   This note was created using a voice recognition software as a result there may be grammatical errors inadvertently enclosed that do not reflect the nature of this encounter. Every attempt is made to correct such errors.  Rex Kras, [place], [facility]  Pager: [phone] Office: [phone]

## 2022-10-05 ENCOUNTER — Other Ambulatory Visit: Payer: Self-pay

## 2022-10-05 ENCOUNTER — Ambulatory Visit (INDEPENDENT_AMBULATORY_CARE_PROVIDER_SITE_OTHER): Payer: Medicaid Other | Admitting: Allergy

## 2022-10-05 ENCOUNTER — Encounter: Payer: Self-pay | Admitting: Allergy

## 2022-10-05 VITALS — BP 128/84 | HR 74 | Temp 98.7°F | Wt 265.9 lb

## 2022-10-05 DIAGNOSIS — J3089 Other allergic rhinitis: Secondary | ICD-10-CM | POA: Diagnosis not present

## 2022-10-05 DIAGNOSIS — G4733 Obstructive sleep apnea (adult) (pediatric): Secondary | ICD-10-CM | POA: Diagnosis not present

## 2022-10-05 DIAGNOSIS — R0982 Postnasal drip: Secondary | ICD-10-CM

## 2022-10-05 DIAGNOSIS — J4530 Mild persistent asthma, uncomplicated: Secondary | ICD-10-CM | POA: Diagnosis not present

## 2022-10-05 NOTE — Progress Notes (Signed)
Follow-up Note  RE: John Price MRN: [id number] DOB: [birth year] Date of Office Visit: 10/05/2022   History of present illness: John Price is a 52 y.o. male presenting today for follow-up of asthma, allergic rhinitis with postnasal drip.  He was last seen in the office on 06/28/2022 by myself.  He states he really doesn't like the way he is breathing when he goes to sleep.  He uses a CPAP but he does not feel like it is doing anything other than making his lips and throat dry.  He will take off the mask when he feels dry.  He uses it with distilled water.  He states will then get some water but sleeps most of the night without it.  He states he does have a pulmonologist at [facility] (Dr Glade Lloyd, he believes).  He has not talked with them about these issues regarding his CPAP device.  He states he only uses albuterol "only when necessary" which is mostly for wheezing.  However he states he is using 1 inhaler every day and the other inhaler as needed.  Upon further questioning he states he was told by the pulmonologist that the Symbicort is not meant to be used every day and the albuterol is to be used every day.  Thus he has been doing albuterol on a daily basis at this time.  Since the last visit fortunately has not had any ED or urgent care visits for his breathing or systemic steroid needs.  He states is taking a mucus reliever as he states phlegm can come up in the mouth.  He does take zyrtec daily and does feels it works way better than loratadine.  He is using the nasal atrovent daily as he does not want to have the nasal drainage.   Review of systems: Review of Systems  Constitutional: Negative.   HENT:         See HPI  Eyes: Negative.   Respiratory: Negative.    Cardiovascular: Negative.   Musculoskeletal: Negative.   Skin: Negative.   Allergic/Immunologic: Negative.   Neurological: Negative.      All other systems negative unless noted above in HPI  Past  medical/social/surgical/family history have been reviewed and are unchanged unless specifically indicated below.  No changes  Medication List: Current Outpatient Medications  Medication Sig Dispense Refill   albuterol (VENTOLIN HFA) 108 (90 Base) MCG/ACT inhaler 2 puffs every 6 hours as needed for wheezing 18 g 1   aspirin 81 MG chewable tablet 1 tab by mouth daily with morning meal 30 tablet 11   atorvastatin (LIPITOR) 40 MG tablet 1 tab by mouth with evening meal. 30 tablet 11   budesonide-formoterol (SYMBICORT) 160-4.5 MCG/ACT inhaler Inhale 2 puffs into the lungs 2 (two) times daily. 1 each 5   carvedilol (COREG) 25 MG tablet TAKE 1 TABLET BY MOUTH 2 TIMES DAILY WITH MEALS 60 tablet 0   cetirizine (ZYRTEC) 10 MG tablet TAKE 1 TABLET(10 MG) BY MOUTH DAILY 30 tablet 5   Cholecalciferol (VITAMIN D3) 1000 units CAPS Take by mouth.     empagliflozin (JARDIANCE) 10 MG TABS tablet Take 10 mg by mouth daily.     famotidine (PEPCID) 40 MG tablet Take 40 mg by mouth at bedtime.     furosemide (LASIX) 40 MG tablet 1 tab by mouth with morning meal 30 tablet 11   ipratropium (ATROVENT) 0.06 % nasal spray Use 1-2 sprays per nostril 3-4 times daily as needed 15 mL 5  isosorbide mononitrate (IMDUR) 60 MG 24 hr tablet Take 1 tablet (60 mg total) by mouth daily. 90 tablet 0   potassium chloride SA (KLOR-CON) 20 MEQ tablet Take 20 mEq by mouth daily.     sacubitril-valsartan (ENTRESTO) 97-103 MG Take 1 tablet by mouth 2 (two) times daily. 180 tablet 0   loratadine (CLARITIN) 10 MG tablet 1 tab by mouth every morning for allergies (Patient not taking: Reported on 10/05/2022) 30 tablet 11   No current facility-administered medications for this visit.     Known medication allergies: Allergies  Allergen Reactions   Gabapentin Anaphylaxis   Other     "States a narcotic made him feel like he wouldn't wake up"     Physical examination: Blood pressure 128/84, pulse 74, temperature 98.7 F (37.1 C),  temperature source Temporal, weight 265 lb 14.4 oz (120.6 kg), SpO2 95 %.  General: Alert, interactive, in no acute distress. HEENT: PERRLA, TMs pearly gray, turbinates mildly edematous without discharge, post-pharynx non erythematous. Neck: Supple without lymphadenopathy. Lungs: Clear to auscultation without wheezing, rhonchi or rales. {no increased work of breathing. CV: Normal S1, S2 without murmurs. Abdomen: Nondistended, nontender. Skin: Warm and dry, without lesions or rashes. Extremities:  No clubbing, cyanosis or edema. Neuro:   Grossly intact.  Diagnositics/Labs:  Spirometry: FEV1: 1.53L 50%, FVC: 2.04L 54% predicted.  This is stable for him  Assessment and plan: Allergic rhinitis with postnasal drip  - Continue avoidance for dust mites and cockroach. - Continue  taking: Zyrtec (cetirizine) 10mg  tablet once daily.   Atrovent (ipratropium) 0.06% 1-2 spray per nostril 3-4 times daily as needed for nasal drainage or congestion - You can use an extra dose of the antihistamine, if needed, for breakthrough symptoms.  - Continue to perform nasal saline rinses  Asthma -Discussed today the differences between Symbicort maintenance inhaler and albuterol short acting bronchodilator.  He did voice understanding that the albuterol is meant for quick relief of symptoms and does not meant to be used on a daily basis. - Daily controller medication: Symbicort 2 puffs twice a day - Rescue medications: albuterol 2 puffs every 4-6 hours as needed   - Asthma control goals:  * Full participation in all desired activities (may need albuterol before activity) * Albuterol use two time or less a week on average (not counting use with activity) * Cough interfering with sleep two time or less a month * Oral steroids no more than once a year * No hospitalizations  Sleep apnea - Recommend you discuss your CPAP issues with your pulmonologist (lung doctor that manages sleep apnea) - Let us know if you  would like a referral to a another sleep medicine doctor to better manage your sleep apnea  Follow-up in 4-6 months or sooner if needed  I appreciate the opportunity to take part in [place] care. Please do not hesitate to contact me with questions.  Sincerely,   Prudy Feeler, MD Allergy/Immunology [facility] [place] [facility] [place]

## 2022-10-05 NOTE — Patient Instructions (Addendum)
-   Continue avoidance for dust mites and cockroach. - Continue  taking: Zyrtec (cetirizine) '10mg'$  tablet once daily.   Atrovent (ipratropium) 0.06% 1-2 spray per nostril 3-4 times daily as needed for nasal drainage or congestion - You can use an extra dose of the antihistamine, if needed, for breakthrough symptoms.  - Continue to perform nasal saline rinses  - Daily controller medication: Symbicort 2 puffs twice a day - Rescue medications: albuterol 2 puffs every 4-6 hours as needed   - Asthma control goals:  * Full participation in all desired activities (may need albuterol before activity) * Albuterol use two time or less a week on average (not counting use with activity) * Cough interfering with sleep two time or less a month * Oral steroids no more than once a year * No hospitalizations  - Recommend you discuss your CPAP issues with your pulmonologist (lung doctor that manages sleep apnea)  Follow-up in 4-6 months or sooner if needed

## 2022-10-06 MED ORDER — IPRATROPIUM BROMIDE 0.06 % NA SOLN: NASAL | 5 refills | Status: DC

## 2022-10-06 MED ORDER — SYMBICORT 160-4.5 MCG/ACT IN AERO: 2.0000 | INHALATION_SPRAY | Freq: Two times a day (BID) | RESPIRATORY_TRACT | 5 refills | Status: DC

## 2022-10-06 MED ORDER — VENTOLIN HFA 108 (90 BASE) MCG/ACT IN AERS: 2.0000 | INHALATION_SPRAY | RESPIRATORY_TRACT | 1 refills | Status: DC | PRN

## 2022-11-30 ENCOUNTER — Other Ambulatory Visit: Payer: Self-pay

## 2022-11-30 ENCOUNTER — Encounter (HOSPITAL_COMMUNITY): Payer: Self-pay

## 2022-11-30 ENCOUNTER — Emergency Department (HOSPITAL_COMMUNITY): Payer: Medicaid Other

## 2022-11-30 ENCOUNTER — Emergency Department (HOSPITAL_COMMUNITY)
Admission: EM | Admit: 2022-11-30 | Discharge: 2022-11-30 | Disposition: A | Discharge status: Home / Self Care | Payer: Medicaid Other | Attending: Emergency Medicine | Admitting: Emergency Medicine

## 2022-11-30 DIAGNOSIS — Z7951 Long term (current) use of inhaled steroids: Secondary | ICD-10-CM | POA: Diagnosis not present

## 2022-11-30 DIAGNOSIS — I509 Heart failure, unspecified: Secondary | ICD-10-CM | POA: Insufficient documentation

## 2022-11-30 DIAGNOSIS — R42 Dizziness and giddiness: Secondary | ICD-10-CM | POA: Insufficient documentation

## 2022-11-30 DIAGNOSIS — I2510 Atherosclerotic heart disease of native coronary artery without angina pectoris: Secondary | ICD-10-CM | POA: Insufficient documentation

## 2022-11-30 DIAGNOSIS — R252 Cramp and spasm: Secondary | ICD-10-CM | POA: Diagnosis not present

## 2022-11-30 DIAGNOSIS — I110 Hypertensive heart disease with heart failure: Secondary | ICD-10-CM | POA: Diagnosis not present

## 2022-11-30 DIAGNOSIS — Z79899 Other long term (current) drug therapy: Secondary | ICD-10-CM | POA: Diagnosis not present

## 2022-11-30 DIAGNOSIS — J45909 Unspecified asthma, uncomplicated: Secondary | ICD-10-CM | POA: Insufficient documentation

## 2022-11-30 DIAGNOSIS — Z7982 Long term (current) use of aspirin: Secondary | ICD-10-CM | POA: Diagnosis not present

## 2022-11-30 DIAGNOSIS — R519 Headache, unspecified: Secondary | ICD-10-CM | POA: Insufficient documentation

## 2022-11-30 LAB — COMPREHENSIVE METABOLIC PANEL
ALT: 39 U/L (ref 0–44)
AST: 26 U/L (ref 15–41)
Albumin: 3.8 g/dL (ref 3.5–5.0)
Alkaline Phosphatase: 51 U/L (ref 38–126)
Anion gap: 9 (ref 5–15)
BUN: 17 mg/dL (ref 6–20)
CO2: 26 mmol/L (ref 22–32)
Calcium: 9 mg/dL (ref 8.9–10.3)
Chloride: 103 mmol/L (ref 98–111)
Creatinine, Ser: 1.89 mg/dL — ABNORMAL HIGH (ref 0.61–1.24)
GFR, Estimated: 43 mL/min — ABNORMAL LOW (ref >60)
Glucose, Bld: 109 mg/dL — ABNORMAL HIGH (ref 70–99)
Potassium: 3.1 mmol/L — ABNORMAL LOW (ref 3.5–5.1)
Sodium: 138 mmol/L (ref 135–145)
Total Bilirubin: 1 mg/dL (ref 0.3–1.2)
Total Protein: 6.9 g/dL (ref 6.5–8.1)

## 2022-11-30 LAB — CBC WITH DIFFERENTIAL/PLATELET
Abs Immature Granulocytes: 0.01 10*3/uL (ref 0.00–0.07)
Basophils Absolute: 0 10*3/uL (ref 0.0–0.1)
Basophils Relative: 1 %
Eosinophils Absolute: 0.5 10*3/uL (ref 0.0–0.5)
Eosinophils Relative: 10 %
HCT: 43.9 % (ref 39.0–52.0)
Hemoglobin: 15 g/dL (ref 13.0–17.0)
Immature Granulocytes: 0 %
Lymphocytes Relative: 44 %
Lymphs Abs: 2.4 10*3/uL (ref 0.7–4.0)
MCH: 32 pg (ref 26.0–34.0)
MCHC: 34.2 g/dL (ref 30.0–36.0)
MCV: 93.6 fL (ref 80.0–100.0)
Monocytes Absolute: 0.4 10*3/uL (ref 0.1–1.0)
Monocytes Relative: 8 %
Neutro Abs: 1.9 10*3/uL (ref 1.7–7.7)
Neutrophils Relative %: 37 %
Platelets: 283 10*3/uL (ref 150–400)
RBC: 4.69 MIL/uL (ref 4.22–5.81)
RDW: 12.5 % (ref 11.5–15.5)
WBC: 5.3 10*3/uL (ref 4.0–10.5)
nRBC: 0 % (ref 0.0–0.2)

## 2022-11-30 LAB — TROPONIN I (HIGH SENSITIVITY)
Troponin I (High Sensitivity): 41 ng/L — ABNORMAL HIGH (ref <18)
Troponin I (High Sensitivity): 37 ng/L — ABNORMAL HIGH (ref <18)

## 2022-11-30 IMAGING — CR DG CHEST 2V
2 series · 2 of 2 positions shown · non-contrast
Comparison: None.

CLINICAL DATA: Dyspnea

EXAM:
CHEST - 2 VIEW

[w chest pa]
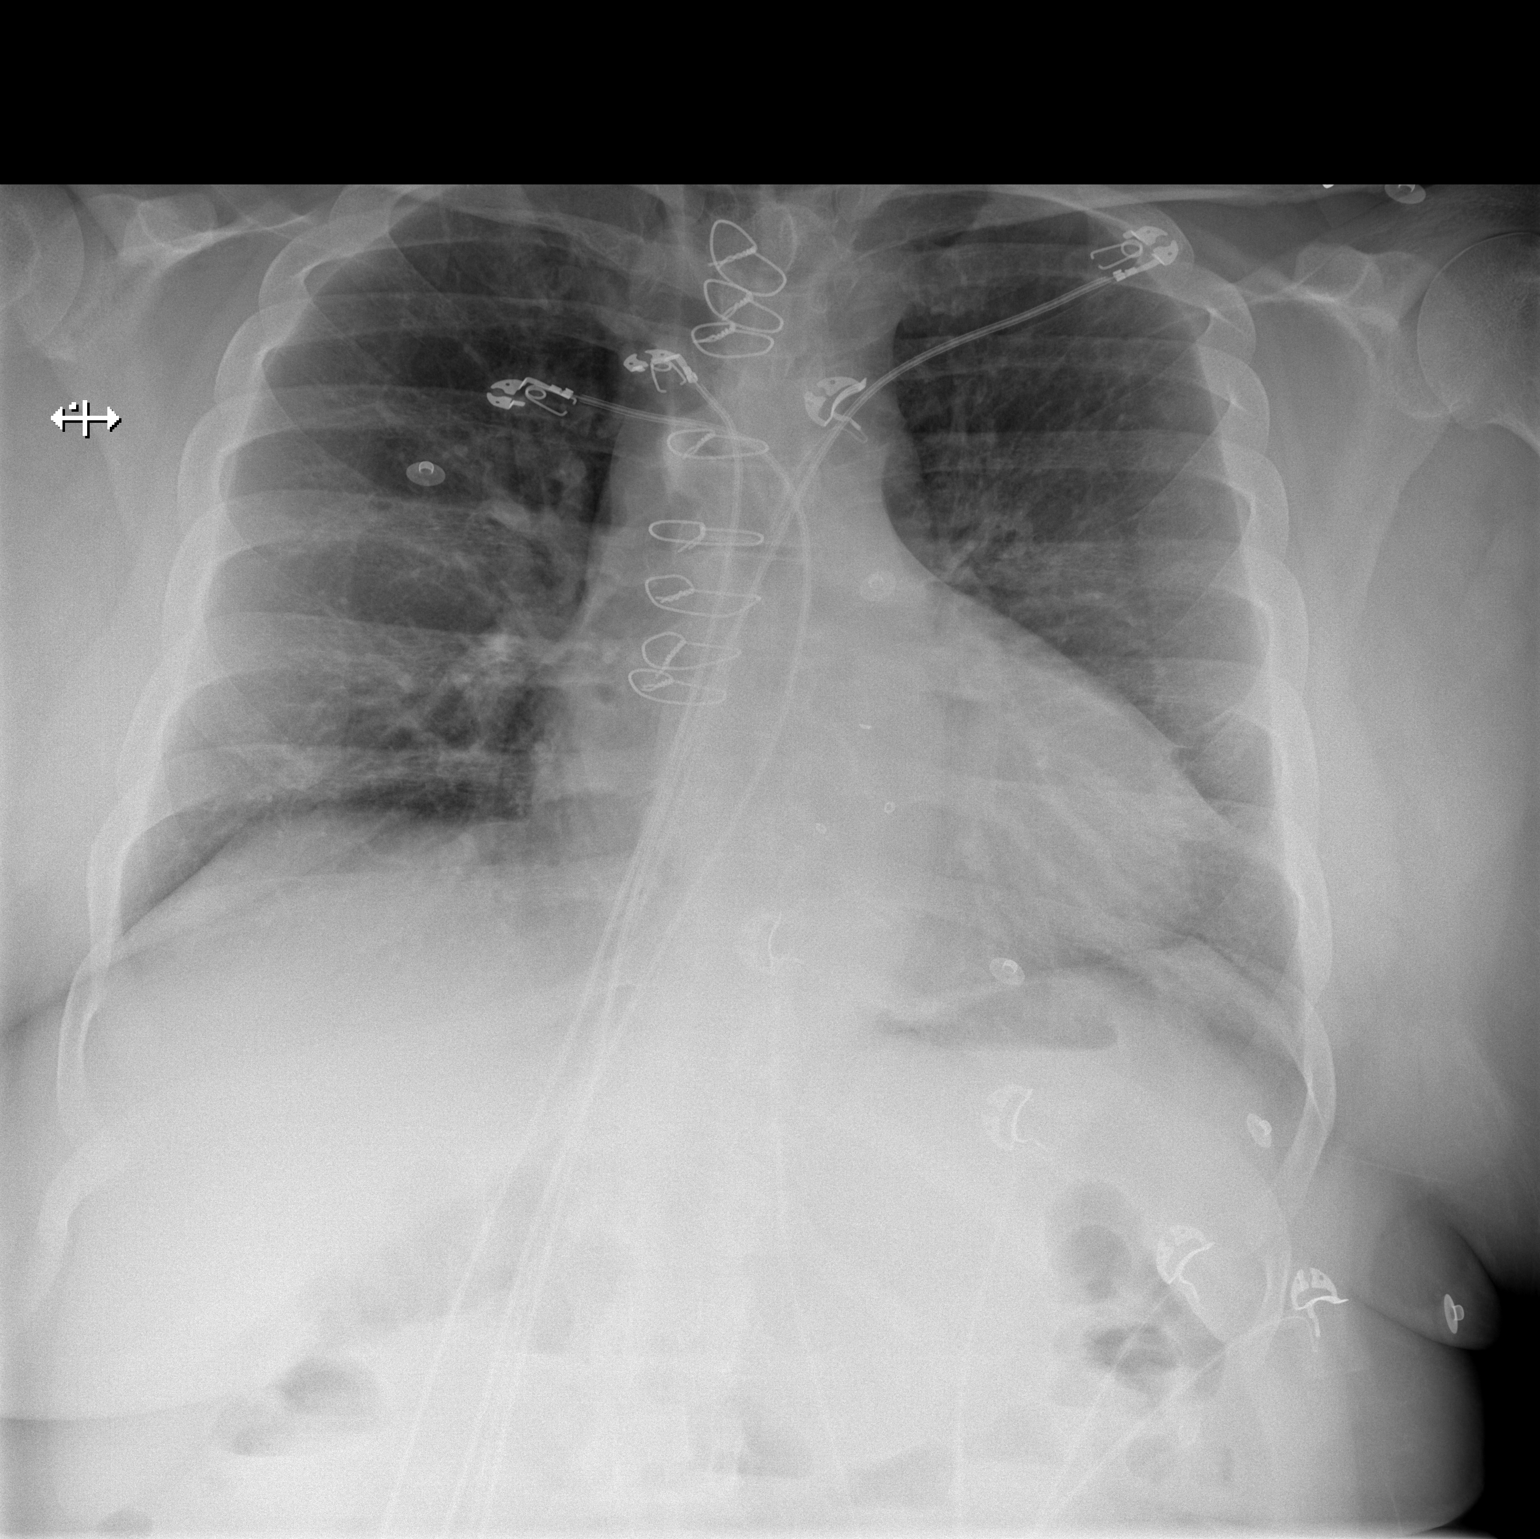

[w chest lat]
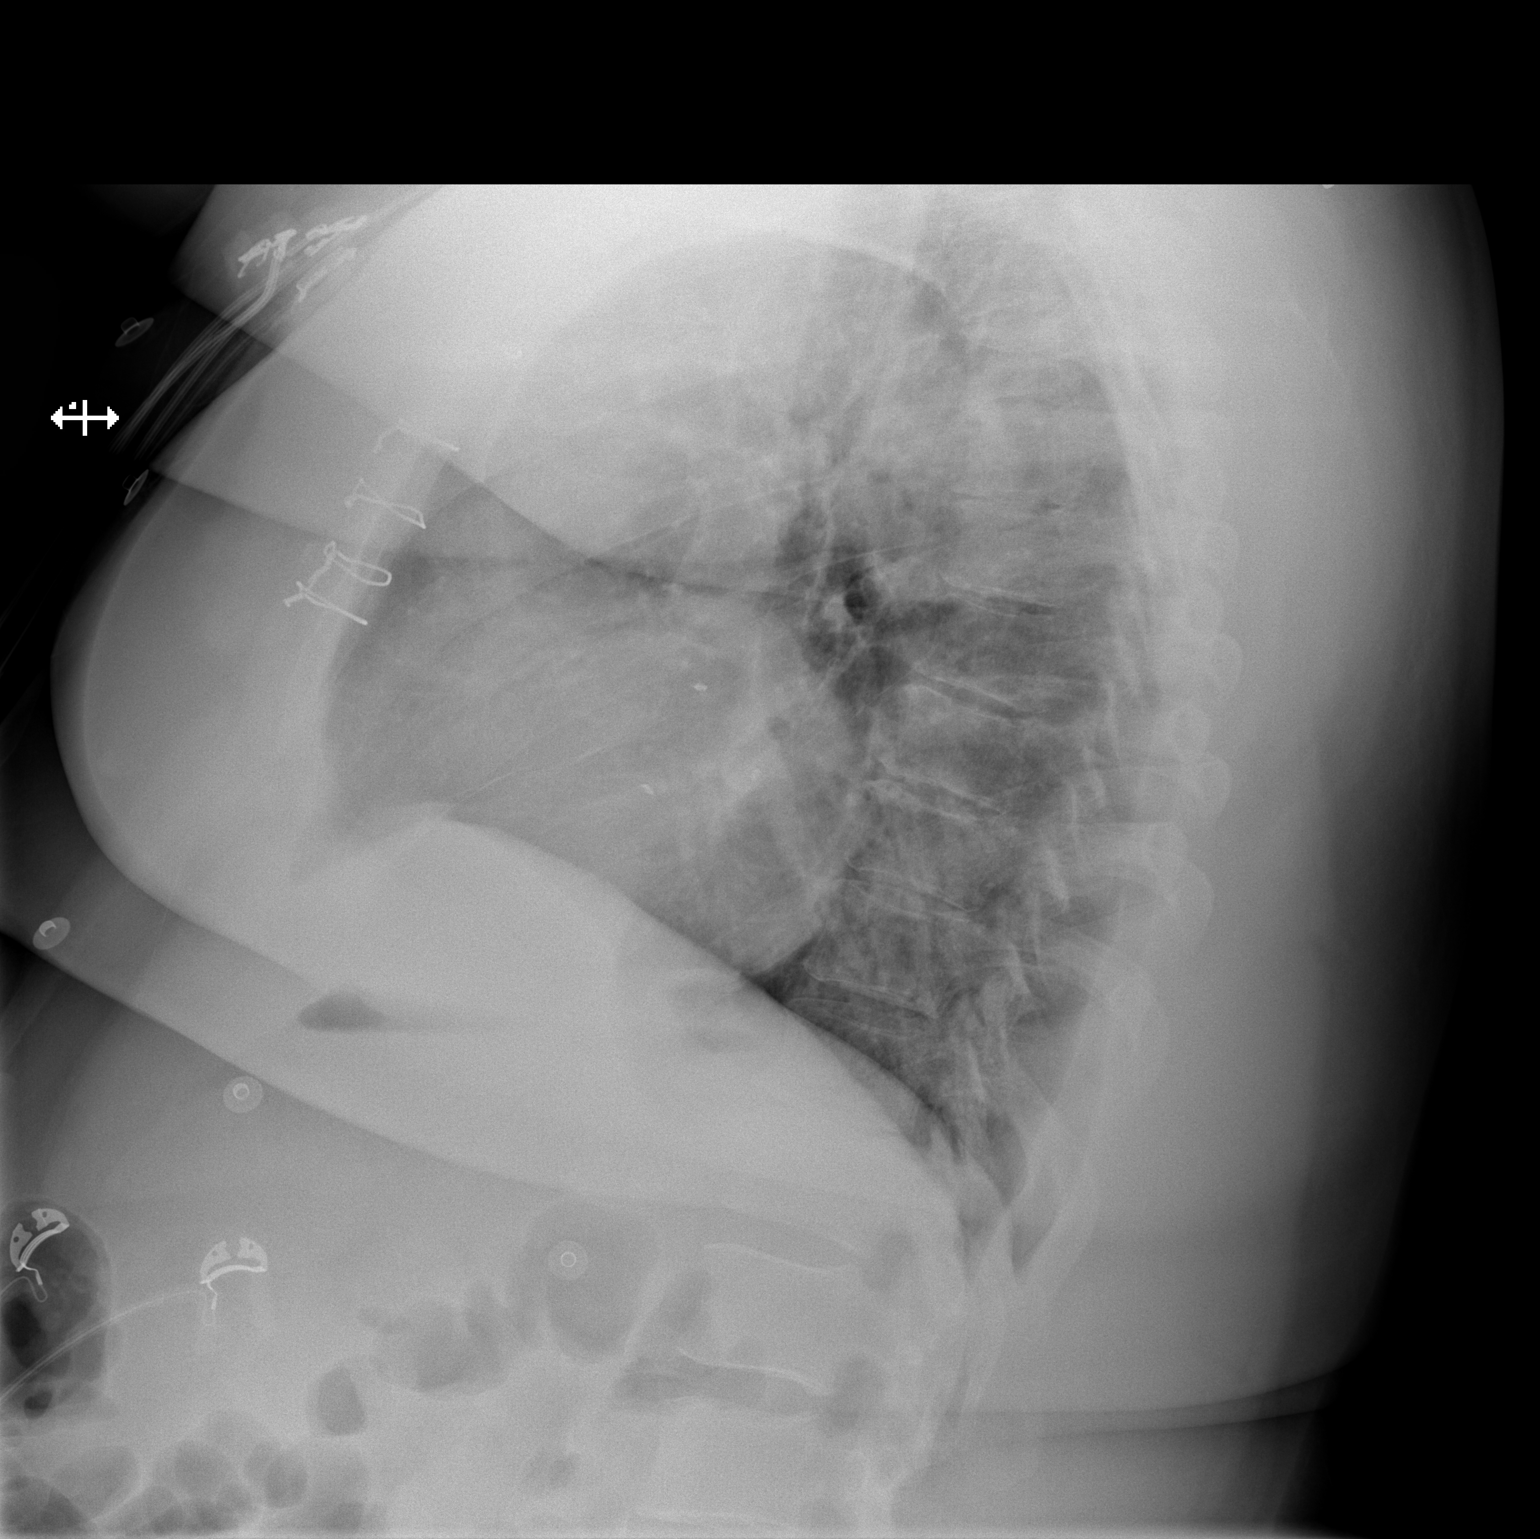

[2 of 2 positions shown; findings below may reference images not displayed]

FINDINGS: Lungs are well expanded, symmetric, and clear. No pneumothorax or
pleural effusion. Mild cardiomegaly with left atrial enlargement.
Mitral valve replacement has been performed. Pulmonary vascularity
is normal. Osseous structures are age-appropriate. No acute bone
abnormality.
IMPRESSION: No radiographic evidence of acute cardiopulmonary disease.

Mild cardiomegaly with left atrial enlargement.

## 2022-11-30 MED ORDER — MECLIZINE HCL 25 MG PO TABS
25.0000 mg | ORAL_TABLET | Freq: Once | ORAL | Status: AC
  Administered 2022-11-30: 25 mg via ORAL
  Filled 2022-11-30: qty 1

## 2022-11-30 NOTE — ED Provider Notes (Signed)
[place] EMERGENCY DEPARTMENT AT [facility] Provider Note   CSN: [id number] Arrival date & time: 11/30/22  1216     History  Chief Complaint  Patient presents with   Dizziness    John Price is a 52 y.o. male.  52 y.o male with a PMH of HTN, CHF, CAD, Asthma from PCP office for sudden onset of dizziness x today.  Patient reports he was at his primary care office in order to obtain a checkup, when suddenly he felt his left leg spasming and felt the vertigo episode come on suddenly.  He reports the entire room was spinning, similar to his prior episodes of vertigo.  Patient's nurse who was with him, reports patient needs to be walking with a walker with a seat rather than a cane which is what he currently ambulates with.  He also endorses a headache, frontal aspect radiating over to the temporal area no alleviating or exacerbating factors.  Headaches do come on and off.  He did not take any of his meclizine.  Patient reports he has been under a lot of stress lately and has not slept in several days.  No fever, no neck pain, no other complaints.  The history is provided by the patient and medical records.  Dizziness Quality:  Head spinning and vertigo Severity:  Severe Onset quality:  Sudden Duration:  1 hour Timing:  Intermittent Progression:  Worsening Chronicity:  Recurrent Context: head movement   Relieved by:  Nothing Worsened by:  Nothing Ineffective treatments:  None tried Associated symptoms: headaches   Associated symptoms: no chest pain, no nausea, no shortness of breath, no vomiting and no weakness        Home Medications Prior to Admission medications   Medication Sig Start Date End Date Taking? Authorizing Provider  albuterol (VENTOLIN HFA) 108 (90 Base) MCG/ACT inhaler 2 puffs every 6 hours as needed for wheezing 05/02/21   Julieanne Manson, MD  aspirin 81 MG chewable tablet 1 tab by mouth daily with morning meal 05/02/21   Julieanne Manson, MD  atorvastatin (LIPITOR) 40 MG tablet 1 tab by mouth with evening meal. 04/10/22   Tolia, Sunit, DO  carvedilol (COREG) 25 MG tablet TAKE 1 TABLET BY MOUTH 2 TIMES DAILY WITH MEALS 05/04/22   Julieanne Manson, MD  cetirizine (ZYRTEC) 10 MG tablet TAKE 1 TABLET(10 MG) BY MOUTH DAILY 08/10/22   Marcelyn Bruins, MD  Cholecalciferol (VITAMIN D3) 1000 units CAPS Take by mouth.    [provider]  empagliflozin (JARDIANCE) 10 MG TABS tablet Take 10 mg by mouth daily.    [provider]  famotidine (PEPCID) 40 MG tablet Take 40 mg by mouth at bedtime. 12/06/21   [provider]  furosemide (LASIX) 40 MG tablet 1 tab by mouth with morning meal 05/02/21   Julieanne Manson, MD  ipratropium (ATROVENT) 0.06 % nasal spray Use 1-2 sprays per nostril 3-4 times daily as needed 10/06/22   Marcelyn Bruins, MD  isosorbide mononitrate (IMDUR) 60 MG 24 hr tablet Take 1 tablet (60 mg total) by mouth daily. 06/07/22   Tolia, Sunit, DO  loratadine (CLARITIN) 10 MG tablet 1 tab by mouth every morning for allergies Patient not taking: Reported on 10/05/2022 05/02/21   Julieanne Manson, MD  potassium chloride SA (KLOR-CON) 20 MEQ tablet Take 20 mEq by mouth daily. 03/31/21   [provider]  sacubitril-valsartan (ENTRESTO) 97-103 MG Take 1 tablet by mouth 2 (two) times daily. 09/21/22 12/20/22  Tolia, Sunit, DO  SYMBICORT 160-4.5 MCG/ACT inhaler Inhale 2 puffs into the lungs 2 (two) times daily. 10/06/22   Marcelyn Bruins, MD  VENTOLIN HFA 108 [phone] Base) MCG/ACT inhaler Inhale 2 puffs into the lungs every 4 (four) hours as needed for wheezing or shortness of breath. 10/06/22   Marcelyn Bruins, MD      Allergies    Gabapentin and Other    Review of Systems   Review of Systems  Constitutional:  Negative for chills and fever.  HENT:  Negative for sore throat.   Respiratory:  Negative for shortness of breath.   Cardiovascular:  Negative for  chest pain.  Gastrointestinal:  Negative for abdominal pain, nausea and vomiting.  Genitourinary:  Negative for flank pain.  Skin:  Negative for color change.  Neurological:  Positive for dizziness and headaches. Negative for syncope, speech difficulty, weakness and numbness.  All other systems reviewed and are negative.   Physical Exam Updated Vital Signs BP 109/61 (BP Location: Right Arm)   Pulse 68   Temp 97.8 F (36.6 C) (Oral)   Resp 14   Ht 5\' 7"  (1.702 m)   Wt 120.6 kg   SpO2 97%   BMI 41.64 kg/m  Physical Exam Vitals and nursing note reviewed.  Constitutional:      Appearance: Normal appearance.  HENT:     Head: Normocephalic and atraumatic.     Mouth/Throat:     Mouth: Mucous membranes are moist.  Eyes:     Pupils: Pupils are equal, round, and reactive to light.  Cardiovascular:     Rate and Rhythm: Normal rate.  Pulmonary:     Effort: Pulmonary effort is normal.  Abdominal:     General: Abdomen is flat.     Palpations: Abdomen is soft.  Musculoskeletal:     Cervical back: Normal range of motion and neck supple.  Skin:    General: Skin is warm and dry.  Neurological:     Mental Status: He is alert and oriented to person, place, and time.     ED Results / Procedures / Treatments   Labs (all labs ordered are listed, but only abnormal results are displayed) Labs Reviewed  COMPREHENSIVE METABOLIC PANEL - Abnormal; Notable for the following components:      Result Value   Potassium 3.1 (*)    Glucose, Bld 109 (*)    Creatinine, Ser 1.89 (*)    GFR, Estimated 43 (*)    All other components within normal limits  TROPONIN I (HIGH SENSITIVITY) - Abnormal; Notable for the following components:   Troponin I (High Sensitivity) 41 (*)    All other components within normal limits  TROPONIN I (HIGH SENSITIVITY) - Abnormal; Notable for the following components:   Troponin I (High Sensitivity) 37 (*)    All other components within normal limits  CBC WITH  DIFFERENTIAL/PLATELET    EKG EKG Interpretation  Date/Time:  Thursday Nov 30 2022 13:01:08 EDT Ventricular Rate:  50 PR Interval:  213 QRS Duration: 104 QT Interval:  471 QTC Calculation: 430 R Axis:   -74 Text Interpretation: Sinus rhythm Prolonged PR interval Left anterior fascicular block Abnormal R-wave progression, late transition Abnormal T, consider ischemia, lateral leads No significant change since last tracing Confirmed by Meridee Score [phone]) on 11/30/2022 1:06:06 PM  Radiology CT HEAD WO CONTRAST ( )  Result Date: 11/30/2022 CLINICAL DATA:  Dizziness, headaches EXAM: CT HEAD WITHOUT CONTRAST TECHNIQUE: Contiguous axial images were obtained from the base  of the skull through the vertex without intravenous contrast. RADIATION DOSE REDUCTION: This exam was performed according to the departmental dose-optimization program which includes automated exposure control, adjustment of the mA and/or kV according to patient size and/or use of iterative reconstruction technique. COMPARISON:  None Available. FINDINGS: Brain: No acute intracranial findings are seen. There are no signs of bleeding within the cranium. There is old infarct in right parietal and occipital lobes. There is ex vacuo dilation of right lateral ventricle. There is no shift of midline structures. There is decreased density in periventricular and subcortical white matter. Cortical sulci are prominent, more so in the right cerebral hemisphere. Vascular: Scattered arterial calcifications are seen. Skull: Unremarkable. Sinuses/Orbits: No acute findings are seen. Possible small osteoma is seen in left frontal sinus. Other: None. IMPRESSION: No acute intracranial findings are seen. 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 Atrophy. Small vessel disease. Electronically Signed   By: Ernie Avena M.D.   On: 11/30/2022 18:36    Procedures Procedures    Medications Ordered in ED Medications  meclizine  (ANTIVERT) tablet 25 mg (25 mg Oral Given 11/30/22 1545)    ED Course/ Medical Decision Making/ A&P                             Medical Decision Making Amount and/or Complexity of Data Reviewed Labs: ordered. Radiology: ordered.    This patient presents to the ED for concern of dizziness, this involves a number of treatment options, and is a complaint that carries with it a high risk of complications and morbidity.  The differential diagnosis includes CVA, ACS, electrolyte derangement versus recurrent vertigo.   Co morbidities: Discussed in HPI   Brief History:  See HPI.  EMR reviewed including pt PMHx, past surgical history and past visits to ER.   See HPI for more details   Lab Tests:  I ordered and independently interpreted labs.  The pertinent results include:    I personally reviewed all laboratory work and imaging. Metabolic panel without any acute abnormality specifically kidney function within normal limits and no significant electrolyte abnormalities. CBC without leukocytosis or significant anemia.Creatine function at his baseline. Trop is elevated from prior today.    Imaging Studies:  CT Head:    Cardiac Monitoring:  The patient was maintained on a cardiac monitor.  I personally viewed and interpreted the cardiac monitored which showed an underlying rhythm of: NSR EKG non-ischemic   Medicines ordered:  I ordered medication including meclizine  for symptomatic treatment Reevaluation of the patient after these medicines showed that the patient improved I have reviewed the patients home medicines and have made adjustments as needed  Reevaluation:  After the interventions noted above I re-evaluated patient and found that they have :improved   Social Determinants of Health:  The patient's social determinants of health were a factor in the care of this patient  Problem List / ED Course:  Patient presents to the ED with sudden onset of dizziness  that began while he was at his PCPs office for a checkup.  Does have a prior history of vertigo and similar symptoms in the past consistent with an episode of vertigo, did not take any meclizine.  Does report he feels like he would be better using a walker with a seat rather than ambulating with a cane.  He has not recently been sick, does report headaches that are intermittent every other couple  of days.  Did not take anything for headache today.  Does have left arm along with left leg weakness from a prior CVA, he is not anticoagulated and did not have any trauma prior to arrival in the ED.  Blood work on today's visit is unremarkable with CBC with no leukocytosis, hemoglobin is normal.  CMP without any electrolyte derangement aside from some mild hypokalemia with a potassium of 3.1, creatinine level is high however at his baseline.  LFTs are within normal limits.  Troponin x 2 have been obtained, these are elevated however prior to the elevated, 41 down to 37.  He is not here with any chest pain, arm pain, or shortness of breath. Given meclizine, with some improvement in symptoms after oral intake as well.  Remains hemodynamically stable. CT Head without any acute finding.  I discussed with patient importance of following up with outpatient primary care.  He is agreeable with plan and treatment.  Hemodynamically stable for discharge.   Dispostion:  After consideration of the diagnostic results and the patients response to treatment, I feel that the patent would benefit from outpatient follow up with primary care.     Portions of this note were generated with Scientist, clinical (histocompatibility and immunogenetics). Dictation errors may occur despite best attempts at proofreading.   Final Clinical Impression(s) / ED Diagnoses Final diagnoses:  Dizziness    Rx / DC Orders ED Discharge Orders     None         Claude Manges, Cordelia Poche 11/30/22 1839    Terrilee Files, MD 12/02/22 [phone]

## 2022-11-30 NOTE — Discharge Instructions (Signed)
Your laboratories also within normal limits today.  The CT of your head did not show any acute findings.  Follow-up with your primary care physician as needed.

## 2022-11-30 NOTE — Discharge Planning (Addendum)
Transition of Care [facility]) - Emergency Department Mini Assessment   Patient Details  Name: John Price MRN: [id number] Date of Birth: September 08, 1970  [facility] CM/SW Contact:    Lavenia Atlas, RN Phone Number: 11/30/2022, 6:07 PM   Clinical Narrative: This RNCM received TOC consult for [facility] services. This RNCM spoke with patient who reports he has an ACT team that follows him which includes a nurse. Patient provided his ACT Team contact information: Nurse: [place] [phone], Mallie Mussel [phone]. Patient is interested in a rollator and agrees to have it delivered to his home.  This RNCM spoke [place] with [employer] who is unable to provide any DME to bedside tonight, however can deliver to patients home tomorrow. EDP and RN notified for need for DME orders.   TOC will continue to follow.   ED Mini Assessment: What brought you to the Emergency Department? : sudden onset of dizziness and headache  Barriers to Discharge: Continued Medical Work up  [employer] interventions: DME: rollator ordered  [employer] of departure: Ambulance  Interventions which prevented an admission or readmission: DME Provided    Patient Contact and Communications        ,          Patient states their goals for this hospitalization and ongoing recovery are:: return home CMS Medicare.gov Compare Post Acute Care list provided to:: Patient Choice offered to / list presented to : Patient  Admission diagnosis:  dizziness Patient Active Problem List   Diagnosis Date Noted   Chronic kidney disease 11/30/2021   Cardiomyopathy (HCC) 11/30/2021   Gastroesophageal reflux disease 11/30/2021   Chronic combined systolic and diastolic heart failure (HCC) 11/30/2021   Hyperglycemia 05/02/2021   History of mitral valve replacement with bioprosthetic valve    Hx of CABG    History of heart failure 12/28/2020   Primary hypertension 12/28/2020   Hyperlipidemia 12/28/2020   PCP:  Eliezer Lofts,  MD Pharmacy:   [facility] [phone] - [place], [place] - [address] AT [facility] [address] [place] [address] Phone: [phone] Fax: [phone]  [id number] - [place], [place] - [address] AT [facility] [address] [place] [address] Phone: [phone] Fax: [phone]  [facility] - [place], [place] - [address] [place] [address] Phone: [phone] Fax: [phone]

## 2022-11-30 NOTE — ED Triage Notes (Signed)
Sent from PCP office. Patient was waiting in lobby, when he stood up had sudden onset of dizziness and headache. Hx of vertigo. Has not taken any meds yesterday or today. Also was unable to sleep last night. Hx of left sided weakness from previous stroke.

## 2022-12-14 ENCOUNTER — Telehealth: Payer: Self-pay

## 2022-12-14 NOTE — Telephone Encounter (Signed)
Someone from [employer] and dropped off a Medical Release form. I have placed this in Nicoles top desk drawer to work on next week. I did inform her to give [place] 3-5 Business days & that she was out of the office today.

## 2023-01-19 ENCOUNTER — Ambulatory Visit (HOSPITAL_COMMUNITY)
Admission: EM | Admit: 2023-01-19 | Discharge: 2023-01-20 | Disposition: A | Discharge status: Home / Self Care | Payer: Medicaid Other

## 2023-01-19 DIAGNOSIS — Z638 Other specified problems related to primary support group: Secondary | ICD-10-CM

## 2023-01-19 DIAGNOSIS — F4322 Adjustment disorder with anxiety: Secondary | ICD-10-CM

## 2023-01-19 NOTE — Progress Notes (Signed)
   01/19/23 2018  [place] Suicide Severity Rating Scale  1. Wish to be Dead Yes  2. Suicidal Thoughts No  6. Suicide Behavior Question No  C-SSRS RISK CATEGORY Low Risk  Patient location: Behavioral Health Outpatient Department  [facility] Outpatient Suicide Precautions Interventions  BH Outpatient Suicide Bundle Interventions Low Risk Interventions

## 2023-01-19 NOTE — Discharge Instructions (Signed)

## 2023-01-19 NOTE — ED Triage Notes (Signed)
Pt presents to [facility] voluntarily. Pt reports getting into a disagreement with mother which caused him to want to end his life. Pt denies HI and AVH at this time. Pt denies past Suicidal ideation and attempts and reports this is the first time he has had these thoughts. Pt is routine.

## 2023-01-19 NOTE — Progress Notes (Signed)
   01/19/23 2018  [facility] Triage Screening (Walk-ins at [facility] only)  How Did You Hear About [place]? Legal System  What Is the Reason for Your Visit/Call Today? Pt presents to [facility] voluntarily. Pt reports getting into a disagreement with mother which caused him to want to end his life. Pt denies HI and AVH at this time. Pt denies past Suicidal ideation and attempts and reports this is the first time he has had these thoughts. Pt is routine.  How Long Has This Been Causing You Problems? <Week  Have You Recently Had Any Thoughts About Hurting Yourself? Yes  How long ago did you have thoughts about hurting yourself? Today, by taking pills  Are You Planning to Commit Suicide/Harm Yourself At This time? No  Have you Recently Had Thoughts About Hurting Someone Karolee Ohs? No  Are You Planning To Harm Someone At This Time? No  Are you currently experiencing any auditory, visual or other hallucinations? No  Have You Used Any Alcohol or Drugs in the Past 24 Hours? No  Do you have any current medical co-morbidities that require immediate attention? Yes  Please describe current medical co-morbidities that require immediate attention: Stroke- weak left side, heart surgery, vertigo  Clinician description of patient physical appearance/behavior: Fairly groomed, calm and cooperative.  What Do You Feel Would Help You the Most Today? Stress Management;Social Support  If access to [facility] Urgent Care was not available, would you have sought care in the Emergency Department? Yes  Determination of Need Routine (7 days)  Options For Referral Outpatient Therapy

## 2023-01-19 NOTE — ED Notes (Signed)
[employer] called to transport pt back to his home

## 2023-01-20 NOTE — ED Notes (Signed)
Call made to [employer] to see about how much longer it would be before they can get patient - they said they "are working on it." Patient updated

## 2023-01-20 NOTE — ED Provider Notes (Signed)
Behavioral Health Urgent Care Medical Screening Exam  Patient Name: John Price MRN: [id number] Date of Evaluation: 01/20/23 Chief Complaint:  "I got into argument with my mother" Diagnosis:  Final diagnoses:  Family discord  Adjustment disorder with anxious mood    History of Present illness: John Price is a 52 y.o. male with reported history of anxiety.  Patient was brought voluntarily to [facility] by law enforcement for mental health evaluation.  Patient was seen face-to-face and his chart was reviewed by this nurse practitioner.  On assessment, he is alert and oriented x 4; calm and cooperative.  Patient speaks in a clear tone,  moderate rate and pace; he has good eye contact.  Patient's mood is anxious with a congruent affect.  His thought process is linear.  Objectively he did not appear to be responding to any internal/external stimuli or experiencing any delusional thought content.  Patient reported he is currently residing with his mother.  He says that he is frustrated with his current living situation due to not getting along with his siblings and mother. Patient says that he is unsteady and requires a cane to ambulate; he says that he got into a verbal altercation with his mother due to his mother not moving out of the way for him while he was walking in their home. He says he needs assistants with housing and reports frustration with department of social services because they were unable to help. He denies SI/HI/AVH/substance abuse. He says he is able to contract for safe and feels safe to return home. He denies psychiatric/medical concerns, he denies any outpatient psychiatric services. He didn't give permission for collateral.   Discussed coming to [facility] to speak with therapist about stress and mental health however patient declined.   Flowsheet Row ED from 01/19/2023 in [facility] ED from 11/30/2022 in [facility] Emergency Department  at [facility] ED from 04/06/2022 in [facility] Emergency Department at [facility]  C-SSRS RISK CATEGORY Low Risk No Risk No Risk       Psychiatric Specialty Exam  Presentation  General Appearance:Appropriate for Environment  Eye Contact:Good  Speech:Clear and Coherent  Speech Volume:Normal  Handedness:Right   Mood and Affect  Mood: Anxious  Affect: Congruent   Thought Process  Thought Processes: Coherent  Descriptions of Associations:Intact  Orientation:Full (Time, Place and Person)  Thought Content:WDL    Hallucinations:None  Ideas of Reference:None  Suicidal Thoughts:No  Homicidal Thoughts:No   Sensorium  Memory: Immediate Good  Judgment: Fair  Insight: Fair   Art therapist  Concentration: Fair  Attention Span: Fair  Recall: [employer] of Knowledge: Fair  Language: Fair   Psychomotor Activity  Psychomotor Activity: Normal   Assets  Assets: Manufacturing systems engineer; Desire for Improvement; Resilience   Sleep  Sleep: Good  Number of hours:  8   Physical Exam: Physical Exam Vitals and nursing note reviewed.  Constitutional:      General: He is not in acute distress.    Appearance: He is well-developed.  HENT:     Head: Normocephalic and atraumatic.  Eyes:     Conjunctiva/sclera: Conjunctivae normal.  Cardiovascular:     Rate and Rhythm: Normal rate.     Heart sounds: No murmur heard. Pulmonary:     Effort: Pulmonary effort is normal. No respiratory distress.  Abdominal:     Palpations: Abdomen is soft.     Tenderness: There is no abdominal tenderness.  Musculoskeletal:  General: No swelling.     Cervical back: Neck supple.  Skin:    General: Skin is warm and dry.     Capillary Refill: Capillary refill takes less than 2 seconds.  Neurological:     Mental Status: He is alert and oriented to person, place, and time.  Psychiatric:        Attention and Perception: Attention and  perception normal.        Mood and Affect: Mood is anxious.        Speech: Speech normal.        Behavior: Behavior normal. Behavior is cooperative.        Thought Content: Thought content normal.        Cognition and Memory: Cognition normal.        Judgment: Judgment normal.    Review of Systems  Constitutional: Negative.   HENT: Negative.    Eyes: Negative.   Respiratory: Negative.    Cardiovascular: Negative.   Gastrointestinal: Negative.   Genitourinary: Negative.   Musculoskeletal: Negative.   Skin: Negative.   Neurological: Negative.   Endo/Heme/Allergies: Negative.   Psychiatric/Behavioral:  The patient is nervous/anxious.    Blood pressure (!) 140/89, pulse 86, temperature 98.3 F (36.8 C), temperature source Oral, resp. rate 18, SpO2 100 %. There is no height or weight on file to calculate BMI.  Musculoskeletal: Strength & Muscle Tone: within normal limits Gait & Station: normal Patient leans: Right   [facility] MSE Discharge Disposition for Follow up and Recommendations: Based on my evaluation the patient does not appear to have an emergency medical condition and can be discharged with resources and follow up care in outpatient services for Individual Therapy   Maricela Bo, NP 01/20/2023, 1:45 AM

## 2023-02-02 ENCOUNTER — Emergency Department (HOSPITAL_COMMUNITY)
Admission: EM | Admit: 2023-02-02 | Discharge: 2023-02-02 | Disposition: A | Discharge status: Home / Self Care | Payer: MEDICAID | Attending: Emergency Medicine | Admitting: Emergency Medicine

## 2023-02-02 ENCOUNTER — Emergency Department (HOSPITAL_COMMUNITY): Payer: MEDICAID

## 2023-02-02 ENCOUNTER — Encounter (HOSPITAL_COMMUNITY): Payer: Self-pay

## 2023-02-02 ENCOUNTER — Other Ambulatory Visit: Payer: Self-pay

## 2023-02-02 DIAGNOSIS — Z7982 Long term (current) use of aspirin: Secondary | ICD-10-CM | POA: Insufficient documentation

## 2023-02-02 DIAGNOSIS — R059 Cough, unspecified: Secondary | ICD-10-CM | POA: Insufficient documentation

## 2023-02-02 DIAGNOSIS — R079 Chest pain, unspecified: Secondary | ICD-10-CM | POA: Insufficient documentation

## 2023-02-02 DIAGNOSIS — N189 Chronic kidney disease, unspecified: Secondary | ICD-10-CM | POA: Insufficient documentation

## 2023-02-02 LAB — BASIC METABOLIC PANEL
Anion gap: 14 (ref 5–15)
BUN: 19 mg/dL (ref 6–20)
CO2: 26 mmol/L (ref 22–32)
Calcium: 9.6 mg/dL (ref 8.9–10.3)
Chloride: 105 mmol/L (ref 98–111)
Creatinine, Ser: 2.04 mg/dL — ABNORMAL HIGH (ref 0.61–1.24)
GFR, Estimated: 39 mL/min — ABNORMAL LOW (ref >60)
Glucose, Bld: 113 mg/dL — ABNORMAL HIGH (ref 70–99)
Potassium: 3.3 mmol/L — ABNORMAL LOW (ref 3.5–5.1)
Sodium: 145 mmol/L (ref 135–145)

## 2023-02-02 LAB — CBC
HCT: 43 % (ref 39.0–52.0)
Hemoglobin: 14.9 g/dL (ref 13.0–17.0)
MCH: 31.6 pg (ref 26.0–34.0)
MCHC: 34.7 g/dL (ref 30.0–36.0)
MCV: 91.1 fL (ref 80.0–100.0)
Platelets: 241 10*3/uL (ref 150–400)
RBC: 4.72 MIL/uL (ref 4.22–5.81)
RDW: 11.6 % (ref 11.5–15.5)
WBC: 5.3 10*3/uL (ref 4.0–10.5)
nRBC: 0 % (ref 0.0–0.2)

## 2023-02-02 LAB — TROPONIN I (HIGH SENSITIVITY)
Troponin I (High Sensitivity): 38 ng/L — ABNORMAL HIGH (ref <18)
Troponin I (High Sensitivity): 42 ng/L — ABNORMAL HIGH (ref <18)

## 2023-02-02 MED ORDER — SUCRALFATE 1 G PO TABS
1.0000 g | ORAL_TABLET | Freq: Once | ORAL | Status: AC
  Administered 2023-02-02: 1 g via ORAL
  Filled 2023-02-02: qty 1

## 2023-02-02 MED ORDER — SUCRALFATE 1 G PO TABS: 1.0000 g | ORAL_TABLET | Freq: Two times a day (BID) | ORAL | 0 refills | Status: AC

## 2023-02-02 MED ORDER — SUCRALFATE 1 G PO TABS: 1.0000 g | ORAL_TABLET | Freq: Three times a day (TID) | ORAL | 0 refills | Status: DC

## 2023-02-02 NOTE — ED Provider Notes (Signed)
[place] [facility]GENC[facility] DEPARTMENT AT [facility] Provider Note   CSN: [id number] Arrival date & time: 02/02/23  1342     History  Chief Complaint  Patient presents with   Chest Pain    FORNEY WESTMORELAND is a 52 y.o. male.  Patient here with chest pain.  Coughing up.  Clear his throat often.  He is on famotidine and omeprazole for acid reflux.  He denies any black or bloody stools.  Denies any shortness of breath or active chest pain now.  He has not been having any exertional symptoms.  He has a history of CKD.  Denies any fevers or chills.  Nothing makes it worse or better.  The history is provided by the patient.       Home Medications Prior to Admission medications   Medication Sig Start Date End Date Taking? Authorizing Provider  albuterol (VENTOLIN HFA) 108 (90 Base) MCG/ACT inhaler 2 puffs every 6 hours as needed for wheezing 05/02/21   Julieanne Manson, MD  aspirin 81 MG chewable tablet 1 tab by mouth daily with morning meal 05/02/21   Julieanne Manson, MD  atorvastatin (LIPITOR) 40 MG tablet 1 tab by mouth with evening meal. 04/10/22   Tolia, Sunit, DO  carvedilol (COREG) 25 MG tablet TAKE 1 TABLET BY MOUTH 2 TIMES DAILY WITH MEALS 05/04/22   Julieanne Manson, MD  cetirizine (ZYRTEC) 10 MG tablet TAKE 1 TABLET(10 MG) BY MOUTH DAILY 08/10/22   Marcelyn Bruins, MD  Cholecalciferol (VITAMIN D3) 1000 units CAPS Take by mouth.    [provider]  empagliflozin (JARDIANCE) 10 MG TABS tablet Take 10 mg by mouth daily.    [provider]  famotidine (PEPCID) 40 MG tablet Take 40 mg by mouth at bedtime. 12/06/21   [provider]  furosemide (LASIX) 40 MG tablet 1 tab by mouth with morning meal 05/02/21   Julieanne Manson, MD  ipratropium (ATROVENT) 0.06 % nasal spray Use 1-2 sprays per nostril 3-4 times daily as needed 10/06/22   Marcelyn Bruins, MD  isosorbide mononitrate (IMDUR) 60 MG 24 hr tablet Take 1 tablet (60 mg  total) by mouth daily. 06/07/22   Tolia, Sunit, DO  loratadine (CLARITIN) 10 MG tablet 1 tab by mouth every morning for allergies Patient not taking: Reported on 10/05/2022 05/02/21   Julieanne Manson, MD  potassium chloride SA (KLOR-CON) 20 MEQ tablet Take 20 mEq by mouth daily. 03/31/21   [provider]  sucralfate (CARAFATE) 1 g tablet Take 1 tablet (1 g total) by mouth 2 (two) times daily for 14 days. 02/02/23 02/16/23  Maddisyn Hegwood, DO  SYMBICORT 160-4.5 MCG/ACT inhaler Inhale 2 puffs into the lungs 2 (two) times daily. 10/06/22   Marcelyn Bruins, MD  VENTOLIN HFA 108 [phone] Base) MCG/ACT inhaler Inhale 2 puffs into the lungs every 4 (four) hours as needed for wheezing or shortness of breath. 10/06/22   Marcelyn Bruins, MD      Allergies    Gabapentin and Other    Review of Systems   Review of Systems  Physical Exam Updated Vital Signs BP (!) 184/94   Pulse (!) 51   Temp 98.3 F (36.8 C)   Resp 15   SpO2 93%  Physical Exam Vitals and nursing note reviewed.  Constitutional:      General: He is not in acute distress.    Appearance: He is well-developed. He is not ill-appearing.  HENT:     Head: Normocephalic and  atraumatic.  Eyes:     Extraocular Movements: Extraocular movements intact.     Conjunctiva/sclera: Conjunctivae normal.     Pupils: Pupils are equal, round, and reactive to light.  Cardiovascular:     Rate and Rhythm: Normal rate and regular rhythm.     Pulses:          Radial pulses are 2+ on the right side and 2+ on the left side.     Heart sounds: Normal heart sounds. No murmur heard. Pulmonary:     Effort: Pulmonary effort is normal. No respiratory distress.     Breath sounds: Normal breath sounds. No decreased breath sounds or wheezing.  Abdominal:     Palpations: Abdomen is soft.     Tenderness: There is no abdominal tenderness.  Musculoskeletal:        General: No swelling.     Cervical back: Normal range of motion and neck supple.   Skin:    General: Skin is warm and dry.     Capillary Refill: Capillary refill takes less than 2 seconds.  Neurological:     General: No focal deficit present.     Mental Status: He is alert.  Psychiatric:        Mood and Affect: Mood normal.     ED Results / Procedures / Treatments   Labs (all labs ordered are listed, but only abnormal results are displayed) Labs Reviewed  BASIC METABOLIC PANEL - Abnormal; Notable for the following components:      Result Value   Potassium 3.3 (*)    Glucose, Bld 113 (*)    Creatinine, Ser 2.04 (*)    GFR, Estimated 39 (*)    All other components within normal limits  TROPONIN I (HIGH SENSITIVITY) - Abnormal; Notable for the following components:   Troponin I (High Sensitivity) 38 (*)    All other components within normal limits  TROPONIN I (HIGH SENSITIVITY) - Abnormal; Notable for the following components:   Troponin I (High Sensitivity) 42 (*)    All other components within normal limits  CBC    EKG EKG Interpretation Date/Time:  Friday February 02 2023 13:49:11 EDT Ventricular Rate:  60 PR Interval:  220 QRS Duration:  118 QT Interval:  436 QTC Calculation: 436 R Axis:   -78  Text Interpretation: Sinus rhythm with marked sinus arrhythmia with 1st degree A-V block Left axis deviation Non-specific intra-ventricular conduction delay Minimal voltage criteria for LVH, may be normal variant ( Cornell product ) Abnormal ECG When compared with ECG of 30-Nov-2022 13:01, PREVIOUS ECG IS PRESENT Confirmed by Virgina Norfolk [phone]) on 02/02/2023 5:21:15 PM  Radiology DG Chest 2 View  Result Date: 02/02/2023 CLINICAL DATA:  Chest pain EXAM: CHEST - 2 VIEW COMPARISON:  04/06/2022 FINDINGS: The lungs are clear without focal pneumonia, edema, pneumothorax or pleural effusion. Cardiopericardial silhouette is at upper limits of normal for size. Streaky opacity at the left base suggest atelectasis. No acute bony abnormality. IMPRESSION: Streaky left  basilar opacity suggests atelectasis. Otherwise no acute cardiopulmonary findings. Electronically Signed   By: Kennith Center M.D.   On: 02/02/2023 14:37    Procedures Procedures    Medications Ordered in ED Medications  sucralfate (CARAFATE) tablet 1 g (1 g Oral Given 02/02/23 1853)    ED Course/ Medical Decision Making/ A&P                             Medical Decision  Making Risk Prescription drug management.   Zoila Shutter is here with chest pain.  Little bit hypertensive but otherwise unremarkable vitals.  EKG shows sinus rhythm.  No ischemic changes.  Overall atypical story for ACS.  Have no concern for PE.  Does not sound infectious.  He has been having a lot of reflux type symptoms.  Having to clear his throat, having burning sensation in his chest.  But he is not having any issues eating or drinking.  He is on omeprazole and Pepcid.  Overall will evaluate for ACS, infectious process.  Will check CBC, BMP, troponin, chest x-ray.  Chest x-ray per my review and interpretation shows no evidence of pneumonia or pneumothorax or volume overload.  Troponin 38 and will trend.  This is his baseline.  Likely from CKD.  Creatinine at baseline.  Otherwise no significant leukocytosis or anemia.  Patient given Carafate and feeling better.  If troponin stable will discharge him follow-up with his GI doctor.  Troponin stable at 42.  He is feeling much better after GI cocktail.  Discharged in good condition.  Recommend follow-up primary care doctor gastroenterology.  This chart was dictated using voice recognition software.  Despite best efforts to proofread,  errors can occur which can change the documentation meaning.         Final Clinical Impression(s) / ED Diagnoses Final diagnoses:  Nonspecific chest pain    Rx / DC Orders ED Discharge Orders          Ordered    sucralfate (CARAFATE) 1 g tablet  3 times daily with meals & bedtime,   Status:  Discontinued        02/02/23  2125    sucralfate (CARAFATE) 1 g tablet  2 times daily        02/02/23 2125              Virgina Norfolk, DO 02/02/23 2126

## 2023-02-02 NOTE — ED Provider Triage Note (Signed)
Emergency Medicine Provider Triage Evaluation Note  John Price , a 52 y.o. male  was evaluated in triage.  Pt complains of chest pain starting 1 hr ago. States it came on while he was getting up from sleep to get a glass of water to take his medications. Describes it in the middle of his chest and under his left breast. Feels like "pricking" and like someone "struck him in the chest with a mallet". Feels squeezing at times. Feeling very anxious, has to pause and space out his words.   Hx CHF, HTN, HLD, CABG  Review of Systems  Positive: Chest pain, anxiety, vertigo Negative: Headache, vision changes, SOB, leg pain or swelling, fever or cough  Physical Exam  BP (!) 141/96 (BP Location: Right Arm)   Pulse 67   Temp 98.3 F (36.8 C) (Oral)   Resp 18   SpO2 97%  Gen:   Awake, no distress   Resp:  Normal effort  MSK:   Moves extremities without difficulty  Other:    Medical Decision Making  Medically screening exam initiated at 1:52 PM.  Appropriate orders placed.  Zoila Shutter was informed that the remainder of the evaluation will be completed by another provider, this initial triage assessment does not replace that evaluation, and the importance of remaining in the ED until their evaluation is complete.  Workup initiated   Su Monks, PA-C 02/02/23 1354

## 2023-02-02 NOTE — Discharge Instructions (Addendum)
Follow-up with primary care doctor and gastroenterologist.

## 2023-02-02 NOTE — ED Triage Notes (Signed)
Pt BIB [facility] from home d/t CP that began less than 1 hr prior to arrival & describes it as a "prickling" feeling. Denies n/v or radiation, 12 L good, is having vertigo now with a Hx of it, A/Ox4. Received 324 ASA, no PIV, 150/90, 60 bpm, 20 resp, 99% on RA.

## 2023-03-08 ENCOUNTER — Other Ambulatory Visit: Payer: Self-pay

## 2023-03-08 ENCOUNTER — Encounter: Payer: Self-pay | Admitting: Allergy

## 2023-03-08 ENCOUNTER — Ambulatory Visit (INDEPENDENT_AMBULATORY_CARE_PROVIDER_SITE_OTHER): Payer: MEDICAID | Admitting: Allergy

## 2023-03-08 ENCOUNTER — Telehealth: Payer: Self-pay

## 2023-03-08 VITALS — BP 128/80 | HR 72 | Temp 97.7°F | Wt 250.6 lb

## 2023-03-08 DIAGNOSIS — J4530 Mild persistent asthma, uncomplicated: Secondary | ICD-10-CM | POA: Diagnosis not present

## 2023-03-08 DIAGNOSIS — G4733 Obstructive sleep apnea (adult) (pediatric): Secondary | ICD-10-CM

## 2023-03-08 DIAGNOSIS — J3089 Other allergic rhinitis: Secondary | ICD-10-CM

## 2023-03-08 DIAGNOSIS — R0982 Postnasal drip: Secondary | ICD-10-CM | POA: Diagnosis not present

## 2023-03-08 MED ORDER — BREZTRI AEROSPHERE 160-9-4.8 MCG/ACT IN AERO
2.0000 | INHALATION_SPRAY | Freq: Two times a day (BID) | RESPIRATORY_TRACT | 5 refills | Status: DC
Start: 2023-03-08 — End: 2023-05-14

## 2023-03-08 NOTE — Progress Notes (Signed)
Follow-up Note  RE: John Price MRN: [id number] DOB: 10/02/1970 Date of Office Visit: 03/08/2023  History of present illness: John Price is a 52 y.o. male presenting today for follow-up of asthma, allergic rhinitis with nasal drip. He also has OSA.  He was last seen in the office on 10/05/22 by myself.    He states he is doing so-so.  He states his asthma is so-so in control and states when he gets agitated he will need to use albuterol with that.  He states when he gets hot he can have more asthma symptoms.  He states when he having asthma symptoms he tries to get to a cool environment. He states he has needed to use albuterol more than less but does not know how much he is using albuterol.  He is using symbicort 2 puffs twice a day for control.  He had not had any ED/UC visits or systemic steroid needs since last visit.    He states he using the nasal spray atrovent daily as helps his nasal symptoms.  He continues to take Zyrtec daily as well.  He feels like the combination of these 2 medications helps with his nasal and generalized allergy symptom control.  He is using his CPAP device for sleep apnea management.  Review of systems: 10pt ROS negative unless noted above in HPI  Past medical/social/surgical/family history have been reviewed and are unchanged unless specifically indicated below.  No changes  Medication List: Current Outpatient Medications  Medication Sig Dispense Refill   albuterol (VENTOLIN HFA) 108 (90 Base) MCG/ACT inhaler 2 puffs every 6 hours as needed for wheezing 18 g 1   aspirin 81 MG chewable tablet 1 tab by mouth daily with morning meal 30 tablet 11   atorvastatin (LIPITOR) 40 MG tablet 1 tab by mouth with evening meal. 30 tablet 11   carvedilol (COREG) 25 MG tablet TAKE 1 TABLET BY MOUTH 2 TIMES DAILY WITH MEALS 60 tablet 0   cetirizine (ZYRTEC) 10 MG tablet TAKE 1 TABLET(10 MG) BY MOUTH DAILY 30 tablet 5   Cholecalciferol (VITAMIN D3) 1000  units CAPS Take by mouth.     empagliflozin (JARDIANCE) 10 MG TABS tablet Take 10 mg by mouth daily.     famotidine (PEPCID) 40 MG tablet Take 40 mg by mouth at bedtime.     furosemide (LASIX) 40 MG tablet 1 tab by mouth with morning meal 30 tablet 11   ipratropium (ATROVENT) 0.06 % nasal spray Use 1-2 sprays per nostril 3-4 times daily as needed 15 mL 5   isosorbide mononitrate (IMDUR) 60 MG 24 hr tablet Take 1 tablet (60 mg total) by mouth daily. 90 tablet 0   potassium chloride SA (KLOR-CON) 20 MEQ tablet Take 20 mEq by mouth daily.     SYMBICORT 160-4.5 MCG/ACT inhaler Inhale 2 puffs into the lungs 2 (two) times daily. 10.2 g 5   loratadine (CLARITIN) 10 MG tablet 1 tab by mouth every morning for allergies (Patient not taking: Reported on 10/05/2022) 30 tablet 11   sucralfate (CARAFATE) 1 g tablet Take 1 tablet (1 g total) by mouth 2 (two) times daily for 14 days. 28 tablet 0   No current facility-administered medications for this visit.     Known medication allergies: Allergies  Allergen Reactions   Gabapentin Anaphylaxis   Other     "States a narcotic made him feel like he wouldn't wake up"     Physical examination: Blood pressure 128/80, pulse  72, temperature 97.7 F (36.5 C), temperature source Temporal, weight 250 lb 9.6 oz (113.7 kg), SpO2 96%.  General: Alert, interactive, in no acute distress. HEENT: PERRLA, TMs pearly gray, turbinates non-edematous without discharge, post-pharynx non erythematous. Neck: Supple without lymphadenopathy. Lungs: Clear to auscultation without wheezing, rhonchi or rales. {no increased work of breathing. CV: Normal S1, S2 without murmurs. Abdomen: Nondistended, nontender. Skin: Warm and dry, without lesions or rashes. Extremities:  No clubbing, cyanosis or edema. Neuro:   Grossly intact.  Diagnositics/Labs:  Spirometry: FEV1: 0.49L 38%, FVC: 2.36L 62% predicted.  This is lower than his stable functions.   Assessment and plan: Allergic  rhinitis with postnasal drip -improved - Continue avoidance for dust mites and cockroach. - Continue  taking: Zyrtec (cetirizine) 10mg  tablet once daily.   Atrovent (ipratropium) 0.06% 1-2 spray per nostril 3-4 times daily as needed for nasal drainage or congestion - You can use an extra dose of the antihistamine, if needed, for breakthrough symptoms.  - Continue to perform nasal saline rinses  Asthma, mild persistent - Control could be better - Daily controller medication: change to Breztri 2 puffs twice a day (gold/yellow inhaler). Stop Symbicort at this time.   John Price is a step-up inhaler from Symbicort so should improve your asthma symptom control.   - Rescue medications: albuterol 2 puffs every 4-6 hours as needed   - Asthma control goals:  * Full participation in all desired activities (may need albuterol before activity) * Albuterol use two time or less a week on average (not counting use with activity) * Cough interfering with sleep two time or less a month * Oral steroids no more than once a year * No hospitalizations  OSA - Continue use of CPAP as directed by your pulmonologist or sleep medicine doctor for your CPAP  Follow-up in 3-4 months or sooner if needed  I appreciate the opportunity to take part in John Price's care. Please do not hesitate to contact me with questions.  Sincerely,   Margo Aye, MD Allergy/Immunology [facility]

## 2023-03-08 NOTE — Patient Instructions (Addendum)
-   Continue avoidance for dust mites and cockroach. - Continue  taking: Zyrtec (cetirizine) 10mg  tablet once daily.   Atrovent (ipratropium) 0.06% 1-2 spray per nostril 3-4 times daily as needed for nasal drainage or congestion - You can use an extra dose of the antihistamine, if needed, for breakthrough symptoms.  - Continue to perform nasal saline rinses  - Daily controller medication: change to Breztri 2 puffs twice a day (gold/yellow inhaler). Stop Symbicort at this time.   [name] is a step-up inhaler from Symbicort so should improve your asthma symptom control.   - Rescue medications: albuterol 2 puffs every 4-6 hours as needed   - Asthma control goals:  * Full participation in all desired activities (may need albuterol before activity) * Albuterol use two time or less a week on average (not counting use with activity) * Cough interfering with sleep two time or less a month * Oral steroids no more than once a year * No hospitalizations  - Continue use of CPAP as directed by your pulmonologist or sleep medicine doctor for your CPAP  Follow-up in 3-4 months or sooner if needed

## 2023-03-08 NOTE — Progress Notes (Signed)
Sent to PA team

## 2023-03-08 NOTE — Telephone Encounter (Signed)
Please do PA for Breztri. Not controlled on Symbicort.  Recommend pt have a HFA device for ease of use (pt history of stroke) and gets dizziness with quick and deep inhalations.  Thus do not want a powder inhaler for him.   Tried:  Symbicort 160, budesonide-formoterol 160, generic Spiriva

## 2023-03-09 ENCOUNTER — Other Ambulatory Visit (HOSPITAL_COMMUNITY): Payer: Self-pay

## 2023-03-09 ENCOUNTER — Telehealth: Payer: Self-pay

## 2023-03-09 NOTE — Telephone Encounter (Signed)
PA request has been Submitted. New Encounter created for follow up. For additional info see Pharmacy Prior Auth telephone encounter from 03/09/2023.

## 2023-03-09 NOTE — Telephone Encounter (Signed)
Pharmacy Patient Advocate Encounter   Received notification from Pt Calls Messages that prior authorization for Breztri Aerosphere 160-9-4.8MCG/ACT aerosol is required/requested.   Insurance verification completed.   The patient is insured through [facility] .   Per test claim: PA required; PA started via CoverMyMeds. KEY [id number] . Waiting for clinical questions to populate.

## 2023-03-12 NOTE — Telephone Encounter (Signed)
Forwarding message to provider for next step. 

## 2023-03-12 NOTE — Telephone Encounter (Signed)
 Pharmacy Patient Advocate Encounter  Questions generated, answered and submitted

## 2023-03-12 NOTE — Telephone Encounter (Signed)
Pharmacy Patient Advocate Encounter  Received notification from [facility] that Prior Authorization for John Price has been DENIED. Please advise how you'd like to proceed. Full denial letter will be uploaded to the media tab. See denial reason below.   *Denial letter has been attached in patients media

## 2023-03-13 MED ORDER — TRELEGY ELLIPTA 200-62.5-25 MCG/ACT IN AEPB: 1.0000 | INHALATION_SPRAY | Freq: Every day | RESPIRATORY_TRACT | 5 refills | Status: DC

## 2023-03-13 NOTE — Addendum Note (Signed)
Addended by: Rolland Bimler D on: 03/13/2023 04:44 PM   Modules accepted: Orders

## 2023-03-13 NOTE — Telephone Encounter (Signed)
Trelegy 200 has been sent into pharmacy on file to replace the [facility]. Epic states that Trelegy is Preferred level 1.  Called patient and informed him of the change and that the pharmacy will help him know when the medication is ready for pick up.

## 2023-03-14 ENCOUNTER — Other Ambulatory Visit (HOSPITAL_COMMUNITY): Payer: Self-pay

## 2023-03-14 ENCOUNTER — Telehealth: Payer: Self-pay

## 2023-03-14 NOTE — Telephone Encounter (Signed)
*  Asthma/Allergy   Patient has [employer] and [employer]  Co-pay with Rosann Auerbach is [phone]   Pharmacy Patient Advocate Encounter   Received notification from CoverMyMeds that prior authorization for Trelegy Ellipta 200-62.5-25MCG/ACT aerosol powder  is required/requested.   Insurance verification completed.   The patient is insured through [facility] .   Per test claim: PA required; PA started via CoverMyMeds. KEY [id number] . Waiting for clinical questions to populate.

## 2023-03-15 NOTE — Telephone Encounter (Signed)
Details for appeals process is included in the Denial letter attached in the patients media.

## 2023-03-15 NOTE — Telephone Encounter (Signed)
Pharmacy Patient Advocate Encounter  Received notification from [facility] that Prior Authorization for Trelegy has been DENIED. Please advise how you'd like to proceed. Full denial letter will be uploaded to the media tab. See denial reason below.   *Denial letter attached in patients media

## 2023-03-19 NOTE — Telephone Encounter (Signed)
Per provider:  What is the appeal process? He has asthma that is not controlled with his double agent inhaler.   He needs a simplified regimen and want him on a triple therapy inhaler to avoid having him use 2 different inhalers.   Denial letter and appeals process has been placed in provider's In basket for review.  Forwarding message to provider for next step.

## 2023-03-20 ENCOUNTER — Encounter: Payer: Self-pay | Admitting: Cardiology

## 2023-03-20 ENCOUNTER — Ambulatory Visit: Payer: MEDICAID | Admitting: Cardiology

## 2023-03-20 VITALS — BP 143/83 | HR 51 | Resp 16 | Ht 67.0 in | Wt 245.0 lb

## 2023-03-20 DIAGNOSIS — I129 Hypertensive chronic kidney disease with stage 1 through stage 4 chronic kidney disease, or unspecified chronic kidney disease: Secondary | ICD-10-CM

## 2023-03-20 DIAGNOSIS — I69359 Hemiplegia and hemiparesis following cerebral infarction affecting unspecified side: Secondary | ICD-10-CM

## 2023-03-20 DIAGNOSIS — I491 Atrial premature depolarization: Secondary | ICD-10-CM

## 2023-03-20 DIAGNOSIS — Z953 Presence of xenogenic heart valve: Secondary | ICD-10-CM

## 2023-03-20 DIAGNOSIS — E782 Mixed hyperlipidemia: Secondary | ICD-10-CM

## 2023-03-20 DIAGNOSIS — I5032 Chronic diastolic (congestive) heart failure: Secondary | ICD-10-CM

## 2023-03-20 MED ORDER — SEMAGLUTIDE-WEIGHT MANAGEMENT 0.25 MG/0.5ML ~~LOC~~ SOAJ
0.2500 mg | SUBCUTANEOUS | 0 refills | Status: AC
Start: 2023-03-20 — End: 2023-04-25

## 2023-03-20 NOTE — Progress Notes (Signed)
ID:  John Price, DOB 1970-08-08, MRN [id number]  PCP:  Eliezer Lofts, MD  Cardiologist:  Tessa Lerner, DO, [facility]  (established care 02/28/2021) Nephrologist: Dr. Vallery Sa Former Cardiology Providers: Rubie Maid [facility])  Date: 03/20/23 Last Office Visit: 09/21/2022  Chief Complaint  Patient presents with   Chronic diastolic heart failure (HCC)   HPI  John Price is a 52 y.o. 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  In 2022 patient moved to [facility] and was referred to the practice given his history of mitral valve replacement and multiple cardiovascular risk factors.  He also has a history of hemorrhagic stroke in 2018 which resulted in the left-sided weakness.  The stroke was felt to be secondary to uncontrolled hypertension.  In 2019 he underwent mitral valve replacement and since then has been on medical therapy for heart failure as well.  Over last 6 months patient states he is doing well from a cardiovascular standpoint.  Denies anginal chest pain or heart failure symptoms.  No hospitalizations or urgent care visits for cardiovascular reasons according to him.  Patient states that he is under a lot of stress at home which may be contributing to his elevated blood pressures.  Unable to reconcile his medications accurately as he did not bring the list or the bottles with him.   FUNCTIONAL STATUS: No structured exercise program or daily routine.   ALLERGIES: Allergies  Allergen Reactions   Gabapentin Anaphylaxis   Other     "States a narcotic made him feel like he wouldn't wake up"    MEDICATION LIST PRIOR TO  VISIT: Current Meds  Medication Sig   albuterol (VENTOLIN HFA) 108 (90 Base) MCG/ACT inhaler 2 puffs every 6 hours as needed for wheezing   aspirin 81 MG chewable tablet 1 tab by mouth daily with morning meal   atorvastatin (LIPITOR) 40 MG tablet 1 tab by mouth with evening meal.   Budeson-Glycopyrrol-Formoterol (BREZTRI AEROSPHERE) 160-9-4.8 MCG/ACT AERO Inhale 2 puffs into the lungs in the morning and at bedtime.   carvedilol (COREG) 25 MG tablet TAKE 1 TABLET BY MOUTH 2 TIMES DAILY WITH MEALS   cetirizine (ZYRTEC) 10 MG tablet TAKE 1 TABLET(10 MG) BY MOUTH DAILY   Cholecalciferol (VITAMIN D3) 1000 units CAPS Take by mouth.   empagliflozin (JARDIANCE) 10 MG TABS tablet Take 10 mg by mouth daily.   famotidine (PEPCID) 40 MG tablet Take 40 mg by mouth at bedtime.   furosemide (LASIX) 40 MG tablet 1 tab by mouth with morning meal   ipratropium (ATROVENT) 0.06 % nasal spray Use 1-2 sprays per nostril 3-4 times daily as needed   isosorbide mononitrate (IMDUR) 60 MG 24 hr tablet Take 1 tablet (60 mg total) by mouth daily.   loratadine (CLARITIN) 10 MG tablet 1 tab by mouth every morning for allergies   potassium chloride SA (KLOR-CON) 20 MEQ tablet Take 20 mEq by mouth daily.   sacubitril-valsartan (ENTRESTO) 97-103 MG Take 1 tablet by mouth 2 (two) times daily.   Semaglutide-Weight Management 0.25 MG/0.5ML SOAJ Inject 0.25 mg into the skin once a week for 6 doses.   SYMBICORT 160-4.5 MCG/ACT inhaler Inhale 2 puffs into the lungs 2 (two) times daily.   TRELEGY ELLIPTA 200-62.5-25 MCG/ACT  AEPB Inhale 1 puff into the lungs daily.     PAST MEDICAL HISTORY: Past Medical History:  Diagnosis Date   Asthma    CHF (congestive heart failure) (HCC)    Chronic kidney disease    Coronary artery disease    Hyperlipidemia    Hypertension    Primary hypertension 12/28/2020   Stroke [facility])     PAST SURGICAL HISTORY: Past Surgical History:  Procedure Laterality Date   ANKLE ARTHODESIS W/ ARTHROSCOPY  Left    APPENDECTOMY     CARDIAC VALVE REPLACEMENT     CORONARY ARTERY BYPASS GRAFT  2019    FAMILY HISTORY: The patient family history includes Allergic rhinitis in his cousin and mother; Asthma in his cousin; COPD in his paternal uncle; Early death in his sister; Epilepsy in his sister; Hypertension in his brother, brother, and mother; Kidney disease in his mother; Lupus in his paternal aunt; Seizures in his sister.  SOCIAL HISTORY:  The patient  reports that he quit smoking about 5 years ago. His smoking use included cigarettes. He started smoking about 17 years ago. He has a 3 pack-year smoking history. He has never been exposed to tobacco smoke. He has never used smokeless tobacco. He reports current alcohol use. He reports that he does not use drugs.  REVIEW OF SYSTEMS: Review of Systems  Constitutional: Negative for malaise/fatigue.  Cardiovascular:  Positive for dyspnea on exertion (Chronic and stable). Negative for chest pain, leg swelling, near-syncope, orthopnea, palpitations, paroxysmal nocturnal dyspnea and syncope.  Respiratory:  Positive for shortness of breath (chronic and stable).   Neurological:  Negative for dizziness and vertigo.   PHYSICAL EXAM:    03/20/2023    1:07 PM 03/08/2023    2:56 PM 02/02/2023    7:45 PM  Vitals with BMI  Height 5\' 7"     Weight 245 lbs 250 lbs 10 oz   BMI 38.36    Systolic 143 128 213  Diastolic 83 80 94  Pulse 51 72 51   CONSTITUTIONAL: Appears older than stated age, hemodynamically stable, no acute distress, ambulates with a cane.  SKIN: Skin is warm and dry. No rash noted. No cyanosis. No pallor. No jaundice HEAD: Normocephalic and atraumatic.  EYES: No scleral icterus.  Arcus senilis MOUTH/THROAT: Moist oral membranes.  NECK: No JVD present. No thyromegaly noted. CHEST Normal respiratory effort. No intercostal retractions.  Sternotomy site is clean dry and intact LUNGS: Clear to auscultation bilaterally.  No stridor. No wheezes. No  rales.  CARDIOVASCULAR: Regular rate rhythm, positive S1-S2, soft systolic murmur heard at the apex, no gallops or rubs  ABDOMINAL: Obese, soft, nontender, nondistended, positive bowel sounds in all 4 quadrants, no apparent ascites.  EXTREMITIES: No peripheral edema, 2+ posterior tibial pulses bilaterally. HEMATOLOGIC: No significant bruising NEUROLOGIC: Oriented to person, place, and time.  5+ right upper and lower extremity strength, 3+ left upper and lower extremity strength. Normal muscle tone.  PSYCHIATRIC: Normal mood and affect. Normal behavior. Cooperative  CARDIAC DATABASE: EKG: 03/20/2023: Sinus rhythm, 70bpm, first-degree AV block, left axis, left anterior fascicular block, frequent PACs and atrial bigeminy pattern, without underlying injury pattern.  When compared to prior tracing 09/21/2022 PACs are new.  Echocardiogram: 11/2019 [facility]: LVEF 35%, moderate/severe concentric hypertrophy, moderately reduced global left ventricular systolic function, bioprosthetic mitral valve well-seated, normal position.  Mild TR.  Mild LAE.  03/11/2021: LVEF 45-50%, global hypokinesis, moderate LVH, unable to comment on diastolic function due to mitral valve replacement, RV systolic function  reduced (RV S' 7cm/sec and TAPSE 1.47cm), RV size mildly enlarged, normal PASP, mild LAE, bioprosthetic mitral valve in situ, well-seated, normal structure and function.  RAP 8 mmHg.    06/06/2022:  Normal LV systolic function with visual EF 50-55%. Left ventricle cavity is normal in size. Severe concentric hypertrophy of the left ventricle. Normal global wall motion. Indeterminate diastolic filling pattern, elevated LAP.  Bioprosthetic mitral valve. No mitral valve regurgitation. E-wave dominant mitral inflow. Structurally normal tricuspid valve with trace regurgitation. No evidence of pulmonary hypertension. No prior available for comparison.   Lexiscan Tetrofosmin stress test 08/22/2022: 1 Day  Rest/Stress protocol.  Stress EKG is non-diagnostic, as this is pharmacological stress test using Lexiscan. Small size, mild intensity, perfusion defect at the apical lateral segment, suggestive of ischemia at distal RCA/LCX distribution.  Overall accuracy of the study is limited due to decreased trace uptake in the inferior and inferolateral segments more pronounced at rest as this is likely due to soft tissue attenuation artifact given his BMI of 41 and images performed in sitting position. Ischemia in this region cannot be excluded. No evidence of prior infarct.  Left ventricular size is dilated, LVEF 35%, mild global hypokinesis.  No prior studies for comparison  Clinical correlation is required.  Intermediate risk.  LABORATORY DATA:    Latest Ref Rng & Units 02/02/2023    1:53 PM 11/30/2022   12:27 PM 04/06/2022   10:54 PM  CBC  WBC 4.0 - 10.5 K/uL 5.3  5.3  5.7   Hemoglobin 13.0 - 17.0 g/dL 40.9  81.1  91.4   Hematocrit 39.0 - 52.0 % 43.0  43.9  38.0   Platelets 150 - 400 K/uL 241  283  186        Latest Ref Rng & Units 02/02/2023    1:53 PM 11/30/2022   12:27 PM 04/06/2022   10:54 PM  CMP  Glucose 70 - 99 mg/dL 782  956  213   BUN 6 - 20 mg/dL 19  17  17    Creatinine 0.61 - 1.24 mg/dL 0.86  5.78  4.69   Sodium 135 - 145 mmol/L 145  138  141   Potassium 3.5 - 5.1 mmol/L 3.3  3.1  3.4   Chloride 98 - 111 mmol/L 105  103  106   CO2 22 - 32 mmol/L 26  26  23    Calcium 8.9 - 10.3 mg/dL 9.6  9.0  9.1   Total Protein 6.5 - 8.1 g/dL  6.9    Total Bilirubin 0.3 - 1.2 mg/dL  1.0    Alkaline Phos 38 - 126 U/L  51    AST 15 - 41 U/L  26    ALT 0 - 44 U/L  39      Lipid Panel  No results found for: "CHOL", "TRIG", "HDL", "CHOLHDL", "VLDL", "LDLCALC", "LDLDIRECT", "LABVLDL"  No components found for: "NTPROBNP" No results for input(s): "PROBNP" in the last 8760 hours. No results for input(s): "TSH" in the last 8760 hours.   BMP Recent Labs    04/06/22 2254 11/30/22 1227  02/02/23 1353  NA 141 138 145  K 3.4* 3.1* 3.3*  CL 106 103 105  CO2 23 26 26   GLUCOSE 116* 109* 113*  BUN 17 17 19   CREATININE 2.10* 1.89* 2.04*  CALCIUM 9.1 9.0 9.6  GFRNONAA 38* 43* 39*    HEMOGLOBIN A1C Lab Results  Component Value Date   HGBA1C 5.2 05/02/2021   External Labs: Collected: 12/14/2021  by nephrology. BUN 24, creatinine 2.2, EGFR 36. Sodium 141, potassium 3.7, chloride 102, bicarb 30. Magnesium 1.9. Hemoglobin 14.5 g/dL. Random protein to creatinine ratio 309 mg/g Cr with   IMPRESSION:    ICD-10-CM   1. Chronic diastolic heart failure (HCC)  Z61.09 EKG 12-Lead    LONG TERM MONITOR (3-14 DAYS)    Semaglutide-Weight Management 0.25 MG/0.5ML SOAJ    Lipid Panel With LDL/HDL Ratio    LDL cholesterol, direct    CMP14+EGFR    2. Premature atrial contraction  I49.1 LONG TERM MONITOR (3-14 DAYS)    3. Atherosclerosis of native coronary artery of native heart without angina pectoris  I25.10     4. History of mitral valve replacement with bioprosthetic valve  Z95.3     5. Benign hypertension with chronic kidney disease, stage III (HCC)  I12.9    N18.30     6. Mixed hyperlipidemia  E78.2     7. History of hemorrhagic stroke with residual hemiparesis (HCC)  I69.359 Semaglutide-Weight Management 0.25 MG/0.5ML SOAJ    8. Class 2 severe obesity due to excess calories with serious comorbidity and body mass index (BMI) of 38.0 to 38.9 in adult [facility])  E66.01 Semaglutide-Weight Management 0.25 MG/0.5ML SOAJ   Z68.38        RECOMMENDATIONS: ORHAN FINGAR is a 52 y.o. 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  Chronic heart failure with preserved  ejection fraction (HFpEF) [facility]) May 2021: LVEF 35%, see report for additional details. August 2022: LVEF 45-50%, see report for additional details. November 2023: LVEF 50-55% Clinically euvolemic. Stage C, NYHA class II No recent hospitalizations for congestive heart failure since last office visit. Medications are difficult to reconcile and he does not know exactly what he is taking, does not have a medication list, or bottles with him. Patient will call us back with regards to what Entresto dose he is taking so the medication can be reconciled and refilled  Premature atrial contraction Asymptomatic. Incidental finding of frequent PACs on today's EKG. 3-day Zio patch to evaluate for dysrhythmias and PAC burden  History of mitral valve replacement with bioprosthetic valve Most recent echo November 2023, results reviewed. Continue aspirin 81 mg p.o. daily. Patient is aware of endocarditis prophylaxis.  Benign hypertension with chronic kidney disease, stage III (HCC) Office blood pressures are acceptable but not at goal. Difficult to uptitrate medical therapy when he does not know exactly what he is taking. I have asked him to keep a log of his blood pressures and if SBP is consistently greater than 140 mmHg would recommend up titration of medical therapy prior to next office visit. Reemphasized importance of low-salt diet and increasing physical activity as tolerated with a goal of 30 minutes a day 5 days a week  Mixed hyperlipidemia Currently on atorvastatin 40 mg p.o. nightly. Does not endorse myalgias. Given his young age and multiple comorbid conditions he would greatly benefit from lipid management. I do not have the most recent lipid profile available for review. Will check fasting lipid profile. Further recommendations to follow  History of hemorrhagic stroke with residual hemiparesis (HCC) Reemphasized the importance of secondary prevention with focus on improving her  modifiable cardiovascular risk factors such as glycemic control, lipid management, blood pressure control,  weight loss.  Class 2 severe obesity due to excess calories with serious comorbidity and body mass index (BMI) of 38.0 to 38.9 in adult [facility]) Body mass index is 38.37 kg/m. Patient has lost weight since last office visit-congratulated on his efforts. However given his comorbid conditions he would benefit from pharmacological therapy. Will send in a prescription for [facility].  When initiating Wegovy patient is asked to decrease his food intake by 20% each meal to prevent side effect profile.  When he does receive Wegovy I have asked him to come in for a nurse visit to be, regarding medication administration.  FINAL MEDICATION LIST END OF ENCOUNTER:   Current Outpatient Medications:    albuterol (VENTOLIN HFA) 108 (90 Base) MCG/ACT inhaler, 2 puffs every 6 hours as needed for wheezing, Disp: 18 g, Rfl: 1   aspirin 81 MG chewable tablet, 1 tab by mouth daily with morning meal, Disp: 30 tablet, Rfl: 11   atorvastatin (LIPITOR) 40 MG tablet, 1 tab by mouth with evening meal., Disp: 30 tablet, Rfl: 11   Budeson-Glycopyrrol-Formoterol (BREZTRI AEROSPHERE) 160-9-4.8 MCG/ACT AERO, Inhale 2 puffs into the lungs in the morning and at bedtime., Disp: 1 each, Rfl: 5   carvedilol (COREG) 25 MG tablet, TAKE 1 TABLET BY MOUTH 2 TIMES DAILY WITH MEALS, Disp: 60 tablet, Rfl: 0   cetirizine (ZYRTEC) 10 MG tablet, TAKE 1 TABLET(10 MG) BY MOUTH DAILY, Disp: 30 tablet, Rfl: 5   Cholecalciferol (VITAMIN D3) 1000 units CAPS, Take by mouth., Disp: , Rfl:    empagliflozin (JARDIANCE) 10 MG TABS tablet, Take 10 mg by mouth daily., Disp: , Rfl:    famotidine (PEPCID) 40 MG tablet, Take 40 mg by mouth at bedtime., Disp: , Rfl:    furosemide (LASIX) 40 MG tablet, 1 tab by mouth with morning meal, Disp: 30 tablet, Rfl: 11   ipratropium (ATROVENT) 0.06 % nasal spray, Use 1-2 sprays per nostril 3-4 times daily as needed,  Disp: 15 mL, Rfl: 5   isosorbide mononitrate (IMDUR) 60 MG 24 hr tablet, Take 1 tablet (60 mg total) by mouth daily., Disp: 90 tablet, Rfl: 0   loratadine (CLARITIN) 10 MG tablet, 1 tab by mouth every morning for allergies, Disp: 30 tablet, Rfl: 11   potassium chloride SA (KLOR-CON) 20 MEQ tablet, Take 20 mEq by mouth daily., Disp: , Rfl:    sacubitril-valsartan (ENTRESTO) 97-103 MG, Take 1 tablet by mouth 2 (two) times daily., Disp: , Rfl:    Semaglutide-Weight Management 0.25 MG/0.5ML SOAJ, Inject 0.25 mg into the skin once a week for 6 doses., Disp: 3 mL, Rfl: 0   SYMBICORT 160-4.5 MCG/ACT inhaler, Inhale 2 puffs into the lungs 2 (two) times daily., Disp: 10.2 g, Rfl: 5   TRELEGY ELLIPTA 200-62.5-25 MCG/ACT AEPB, Inhale 1 puff into the lungs daily., Disp: 60 each, Rfl: 5   sucralfate (CARAFATE) 1 g tablet, Take 1 tablet (1 g total) by mouth 2 (two) times daily for 14 days., Disp: 28 tablet, Rfl: 0  Orders Placed This Encounter  Procedures   Lipid Panel With LDL/HDL Ratio   LDL cholesterol, direct   [id number]   LONG TERM MONITOR (3-14 DAYS)   EKG 12-Lead    There are no Patient Instructions on file for this visit.   --Continue cardiac medications as reconciled in final medication list. --Return in about 8 weeks (around 05/15/2023) for Follow up mitral valve replacement, review Zio, and recent initiation of Wegovy. Or sooner if needed. --Continue follow-up with your primary care  physician regarding the management of your other chronic comorbid conditions.  Patient's questions and concerns were addressed to his satisfaction. He voices understanding of the instructions provided during this encounter.   This note was created using a voice recognition software as a result there may be grammatical errors inadvertently enclosed that do not reflect the nature of this encounter. Every attempt is made to correct such errors.  Tessa Lerner, [place], [facility]  Pager:  [phone] Office: [phone]

## 2023-03-20 NOTE — Telephone Encounter (Signed)
Please be advised we currently do not have a Pharmacist to review denials, therefore you will need to process appeals accordingly as needed. Thanks for your support at this time. Contact for appeals are as follows: Phone: [phone], Fax: [phone] Last day to appeal is May 14, 2023   Looks like the denial letter for Trelegy also states that patient must try and fail, or have documentation on why patient cannot use, the following: BRAND Advair Diskus, BRAND Advair HFA, Elwin Sleight

## 2023-03-21 NOTE — Telephone Encounter (Signed)
Pa denied pt must try and fail BRAND Advair Diskus, BRAND Advair HFA, Elwin Sleight

## 2023-03-27 ENCOUNTER — Emergency Department (HOSPITAL_COMMUNITY)
Admission: EM | Admit: 2023-03-27 | Discharge: 2023-03-28 | Disposition: A | Discharge status: Home / Self Care | Payer: MEDICAID | Attending: Emergency Medicine | Admitting: Emergency Medicine

## 2023-03-27 DIAGNOSIS — F4329 Adjustment disorder with other symptoms: Secondary | ICD-10-CM

## 2023-03-27 DIAGNOSIS — I2510 Atherosclerotic heart disease of native coronary artery without angina pectoris: Secondary | ICD-10-CM | POA: Diagnosis not present

## 2023-03-27 DIAGNOSIS — N189 Chronic kidney disease, unspecified: Secondary | ICD-10-CM | POA: Insufficient documentation

## 2023-03-27 DIAGNOSIS — F419 Anxiety disorder, unspecified: Secondary | ICD-10-CM | POA: Insufficient documentation

## 2023-03-27 DIAGNOSIS — I509 Heart failure, unspecified: Secondary | ICD-10-CM | POA: Insufficient documentation

## 2023-03-27 DIAGNOSIS — Z79899 Other long term (current) drug therapy: Secondary | ICD-10-CM | POA: Insufficient documentation

## 2023-03-27 DIAGNOSIS — Z7982 Long term (current) use of aspirin: Secondary | ICD-10-CM | POA: Diagnosis not present

## 2023-03-27 DIAGNOSIS — I130 Hypertensive heart and chronic kidney disease with heart failure and stage 1 through stage 4 chronic kidney disease, or unspecified chronic kidney disease: Secondary | ICD-10-CM | POA: Diagnosis not present

## 2023-03-27 DIAGNOSIS — F439 Reaction to severe stress, unspecified: Secondary | ICD-10-CM | POA: Insufficient documentation

## 2023-03-27 LAB — COMPREHENSIVE METABOLIC PANEL
ALT: 31 U/L (ref 0–44)
AST: 22 U/L (ref 15–41)
Albumin: 4 g/dL (ref 3.5–5.0)
Alkaline Phosphatase: 59 U/L (ref 38–126)
Anion gap: 13 (ref 5–15)
BUN: 21 mg/dL — ABNORMAL HIGH (ref 6–20)
CO2: 23 mmol/L (ref 22–32)
Calcium: 9.3 mg/dL (ref 8.9–10.3)
Chloride: 103 mmol/L (ref 98–111)
Creatinine, Ser: 2.8 mg/dL — ABNORMAL HIGH (ref 0.61–1.24)
GFR, Estimated: 27 mL/min — ABNORMAL LOW (ref >60)
Glucose, Bld: 159 mg/dL — ABNORMAL HIGH (ref 70–99)
Potassium: 3.3 mmol/L — ABNORMAL LOW (ref 3.5–5.1)
Sodium: 139 mmol/L (ref 135–145)
Total Bilirubin: 1.3 mg/dL — ABNORMAL HIGH (ref 0.3–1.2)
Total Protein: 7.7 g/dL (ref 6.5–8.1)

## 2023-03-27 LAB — CBC
HCT: 45.5 % (ref 39.0–52.0)
Hemoglobin: 15.7 g/dL (ref 13.0–17.0)
MCH: 32.3 pg (ref 26.0–34.0)
MCHC: 34.5 g/dL (ref 30.0–36.0)
MCV: 93.6 fL (ref 80.0–100.0)
Platelets: 237 10*3/uL (ref 150–400)
RBC: 4.86 MIL/uL (ref 4.22–5.81)
RDW: 11.4 % — ABNORMAL LOW (ref 11.5–15.5)
WBC: 5.4 10*3/uL (ref 4.0–10.5)
nRBC: 0 % (ref 0.0–0.2)

## 2023-03-27 LAB — RAPID URINE DRUG SCREEN, HOSP PERFORMED
Amphetamines: NOT DETECTED
Barbiturates: NOT DETECTED
Benzodiazepines: NOT DETECTED
Cocaine: NOT DETECTED
Opiates: NOT DETECTED
Tetrahydrocannabinol: NOT DETECTED

## 2023-03-27 LAB — ETHANOL: Alcohol, Ethyl (B): <10 mg/dL (ref <10)

## 2023-03-27 MED ORDER — DULERA 200-5 MCG/ACT IN AERO: 2.0000 | INHALATION_SPRAY | Freq: Two times a day (BID) | RESPIRATORY_TRACT | 5 refills | Status: DC

## 2023-03-27 MED ORDER — SPIRIVA RESPIMAT 1.25 MCG/ACT IN AERS: 2.0000 | INHALATION_SPRAY | Freq: Two times a day (BID) | RESPIRATORY_TRACT | 5 refills | Status: DC

## 2023-03-27 NOTE — Addendum Note (Signed)
Addended by: Rolland Bimler D on: 03/27/2023 06:11 PM   Modules accepted: Orders

## 2023-03-27 NOTE — Telephone Encounter (Signed)
Lvm and advised patient to call the office back to go over medication change. Medications sent into pharmacy on file.

## 2023-03-27 NOTE — ED Provider Triage Note (Signed)
Emergency Medicine Provider Triage Evaluation Note  John Price , a 52 y.o. male  was evaluated in triage.  Pt complains of anxiety. Reports that anxiety is primarily stemming from home life.  Currently denies any suicidal ideation, homicidal ideation, auditory visual hallucinations.  Denies any recent substance use.  Patient does not have any medications listed on his chart that he is currently taking for psychiatric conditions.  Denies any prior history significant for schizophrenia does report that he has a history of anxiety.  Review of Systems  Positive: As above Negative: As above  Physical Exam  BP (!) 148/91   Pulse (!) 42   Temp 98.8 F (37.1 C) (Oral)   Resp 20   SpO2 97%  Gen:   Awake, no distress   Resp:  Normal effort  MSK:   Moves extremities without difficulty  Other:    Medical Decision Making  Medically screening exam initiated at 8:03 PM.  Appropriate orders placed.  Zoila Shutter was informed that the remainder of the evaluation will be completed by another provider, this initial triage assessment does not replace that evaluation, and the importance of remaining in the ED until their evaluation is complete.     Smitty Knudsen, PA-C 03/27/23 2005
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
## 2023-03-27 NOTE — ED Notes (Signed)
Pt called to be triaged, no response  

## 2023-03-28 MED ORDER — ATORVASTATIN CALCIUM 40 MG PO TABS: 40.0000 mg | ORAL_TABLET | Freq: Every day | ORAL | Status: DC

## 2023-03-28 MED ORDER — FAMOTIDINE 20 MG PO TABS: 40.0000 mg | ORAL_TABLET | Freq: Every day | ORAL | Status: DC

## 2023-03-28 MED ORDER — ALBUTEROL SULFATE HFA 108 (90 BASE) MCG/ACT IN AERS: 1.0000 | INHALATION_SPRAY | Freq: Four times a day (QID) | RESPIRATORY_TRACT | Status: DC | PRN

## 2023-03-28 MED ORDER — SACUBITRIL-VALSARTAN 97-103 MG PO TABS
1.0000 | ORAL_TABLET | Freq: Two times a day (BID) | ORAL | Status: DC
  Filled 2023-03-28: qty 1

## 2023-03-28 MED ORDER — CARVEDILOL 12.5 MG PO TABS: 25.0000 mg | ORAL_TABLET | Freq: Two times a day (BID) | ORAL | Status: DC

## 2023-03-28 MED ORDER — EMPAGLIFLOZIN 10 MG PO TABS: 10.0000 mg | ORAL_TABLET | Freq: Every day | ORAL | Status: DC

## 2023-03-28 MED ORDER — ISOSORBIDE MONONITRATE ER 30 MG PO TB24: 60.0000 mg | ORAL_TABLET | Freq: Every day | ORAL | Status: DC

## 2023-03-28 MED ORDER — FUROSEMIDE 20 MG PO TABS: 40.0000 mg | ORAL_TABLET | Freq: Every day | ORAL | Status: DC

## 2023-03-28 MED ORDER — POTASSIUM CHLORIDE CRYS ER 20 MEQ PO TBCR: 20.0000 meq | EXTENDED_RELEASE_TABLET | Freq: Every day | ORAL | Status: DC

## 2023-03-28 MED ORDER — MOMETASONE FURO-FORMOTEROL FUM 200-5 MCG/ACT IN AERO
2.0000 | INHALATION_SPRAY | Freq: Two times a day (BID) | RESPIRATORY_TRACT | Status: DC
  Filled 2023-03-28: qty 8.8

## 2023-03-28 MED ORDER — ASPIRIN 81 MG PO CHEW: 81.0000 mg | CHEWABLE_TABLET | Freq: Every day | ORAL | Status: DC

## 2023-03-28 MED ORDER — LORATADINE 10 MG PO TABS: 10.0000 mg | ORAL_TABLET | Freq: Every day | ORAL | Status: DC
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
## 2023-03-28 NOTE — Discharge Instructions (Signed)

## 2023-03-28 NOTE — ED Notes (Signed)
Pt verbalized understanding of discharge instructions. Pt wheeled from the ed. Cab called.

## 2023-04-16 ENCOUNTER — Encounter: Payer: Self-pay | Admitting: *Deleted

## 2023-04-16 ENCOUNTER — Ambulatory Visit: Payer: MEDICAID | Attending: Cardiology

## 2023-04-16 DIAGNOSIS — I5032 Chronic diastolic (congestive) heart failure: Secondary | ICD-10-CM

## 2023-04-16 DIAGNOSIS — I491 Atrial premature depolarization: Secondary | ICD-10-CM

## 2023-04-16 NOTE — Progress Notes (Unsigned)
Enrolled for [employer] to mail a ZIO XT long term holter monitor to the patients address on file.   Letter with instructions mailed to patient and sent to patients MyChart.  Patient will have his nurse help him apply the monitor.

## 2023-04-20 DIAGNOSIS — I491 Atrial premature depolarization: Secondary | ICD-10-CM | POA: Diagnosis not present

## 2023-04-20 DIAGNOSIS — I5032 Chronic diastolic (congestive) heart failure: Secondary | ICD-10-CM | POA: Diagnosis not present

## 2023-04-24 ENCOUNTER — Other Ambulatory Visit: Payer: Self-pay

## 2023-04-24 DIAGNOSIS — I5032 Chronic diastolic (congestive) heart failure: Secondary | ICD-10-CM

## 2023-04-25 LAB — CMP14+EGFR
ALT: 31 IU/L (ref 0–44)
AST: 23 IU/L (ref 0–40)
Albumin: 4.4 g/dL (ref 4.1–5.1)
Alkaline Phosphatase: 85 IU/L (ref 44–121)
BUN/Creatinine Ratio: 8 — ABNORMAL LOW (ref 9–20)
BUN: 18 mg/dL (ref 6–24)
Bilirubin Total: 0.7 mg/dL (ref 0.0–1.2)
CO2: 24 mmol/L (ref 20–29)
Calcium: 10 mg/dL (ref 8.7–10.2)
Chloride: 104 mmol/L (ref 96–106)
Creatinine, Ser: 2.29 mg/dL — ABNORMAL HIGH (ref 0.76–1.27)
Globulin, Total: 3.1 g/dL (ref 1.5–4.5)
Glucose: 113 mg/dL — ABNORMAL HIGH (ref 70–99)
Potassium: 4.2 mmol/L (ref 3.5–5.2)
Sodium: 143 mmol/L (ref 134–144)
Total Protein: 7.5 g/dL (ref 6.0–8.5)
eGFR: 34 mL/min/{1.73_m2} — ABNORMAL LOW (ref >59)

## 2023-04-25 LAB — BASIC METABOLIC PANEL
BUN/Creatinine Ratio: 9 (ref 9–20)
BUN: 19 mg/dL (ref 6–24)
CO2: 24 mmol/L (ref 20–29)
Calcium: 10.2 mg/dL (ref 8.7–10.2)
Chloride: 104 mmol/L (ref 96–106)
Creatinine, Ser: 2.23 mg/dL — ABNORMAL HIGH (ref 0.76–1.27)
Glucose: 115 mg/dL — ABNORMAL HIGH (ref 70–99)
Potassium: 4.2 mmol/L (ref 3.5–5.2)
Sodium: 143 mmol/L (ref 134–144)
eGFR: 35 mL/min/{1.73_m2} — ABNORMAL LOW (ref >59)

## 2023-04-25 LAB — LIPID PANEL WITH LDL/HDL RATIO
Cholesterol, Total: 138 mg/dL (ref 100–199)
HDL: 43 mg/dL (ref >39)
LDL Chol Calc (NIH): 79 mg/dL (ref 0–99)
LDL/HDL Ratio: 1.8 ratio (ref 0.0–3.6)
Triglycerides: 83 mg/dL (ref 0–149)
VLDL Cholesterol Cal: 16 mg/dL (ref 5–40)

## 2023-04-25 LAB — LDL CHOLESTEROL, DIRECT: LDL Direct: 82 mg/dL (ref 0–99)

## 2023-04-25 LAB — MAGNESIUM: Magnesium: 1.8 mg/dL (ref 1.6–2.3)

## 2023-05-01 ENCOUNTER — Other Ambulatory Visit: Payer: Self-pay | Admitting: Nephrology

## 2023-05-01 ENCOUNTER — Telehealth: Payer: Self-pay

## 2023-05-01 DIAGNOSIS — N281 Cyst of kidney, acquired: Secondary | ICD-10-CM

## 2023-05-01 DIAGNOSIS — N1832 Chronic kidney disease, stage 3b: Secondary | ICD-10-CM

## 2023-05-01 NOTE — Telephone Encounter (Signed)
The patient has been notified of the result and verbalized understanding.  All questions (if any) were answered. Frutoso Schatz, RN 05/01/2023 12:16 PM   Patient is going to call back when he has his list of medications with him.

## 2023-05-01 NOTE — Telephone Encounter (Signed)
-----   Message from [facility] sent at 04/30/2023 11:34 AM EDT ----- Please call the patient and reconcile the medications - he did not know them at the last visit.   Pay attention to diuretics as we may need to hold/change given his renal function.   LDL is not at goal but was started on [facility] last visit - see if approved as it will help his lipids too. Continue current statins.   Sunit [place], DO, [facility]

## 2023-05-04 ENCOUNTER — Ambulatory Visit: Payer: MEDICAID | Admitting: Allergy

## 2023-05-04 IMAGING — DX DG CHEST 1V PORT
1 series · 1 of 1 positions shown · non-contrast
Comparison: 10/23/2021

CLINICAL DATA: Shortness of breath.

EXAM:
PORTABLE CHEST 1 VIEW

[chest]
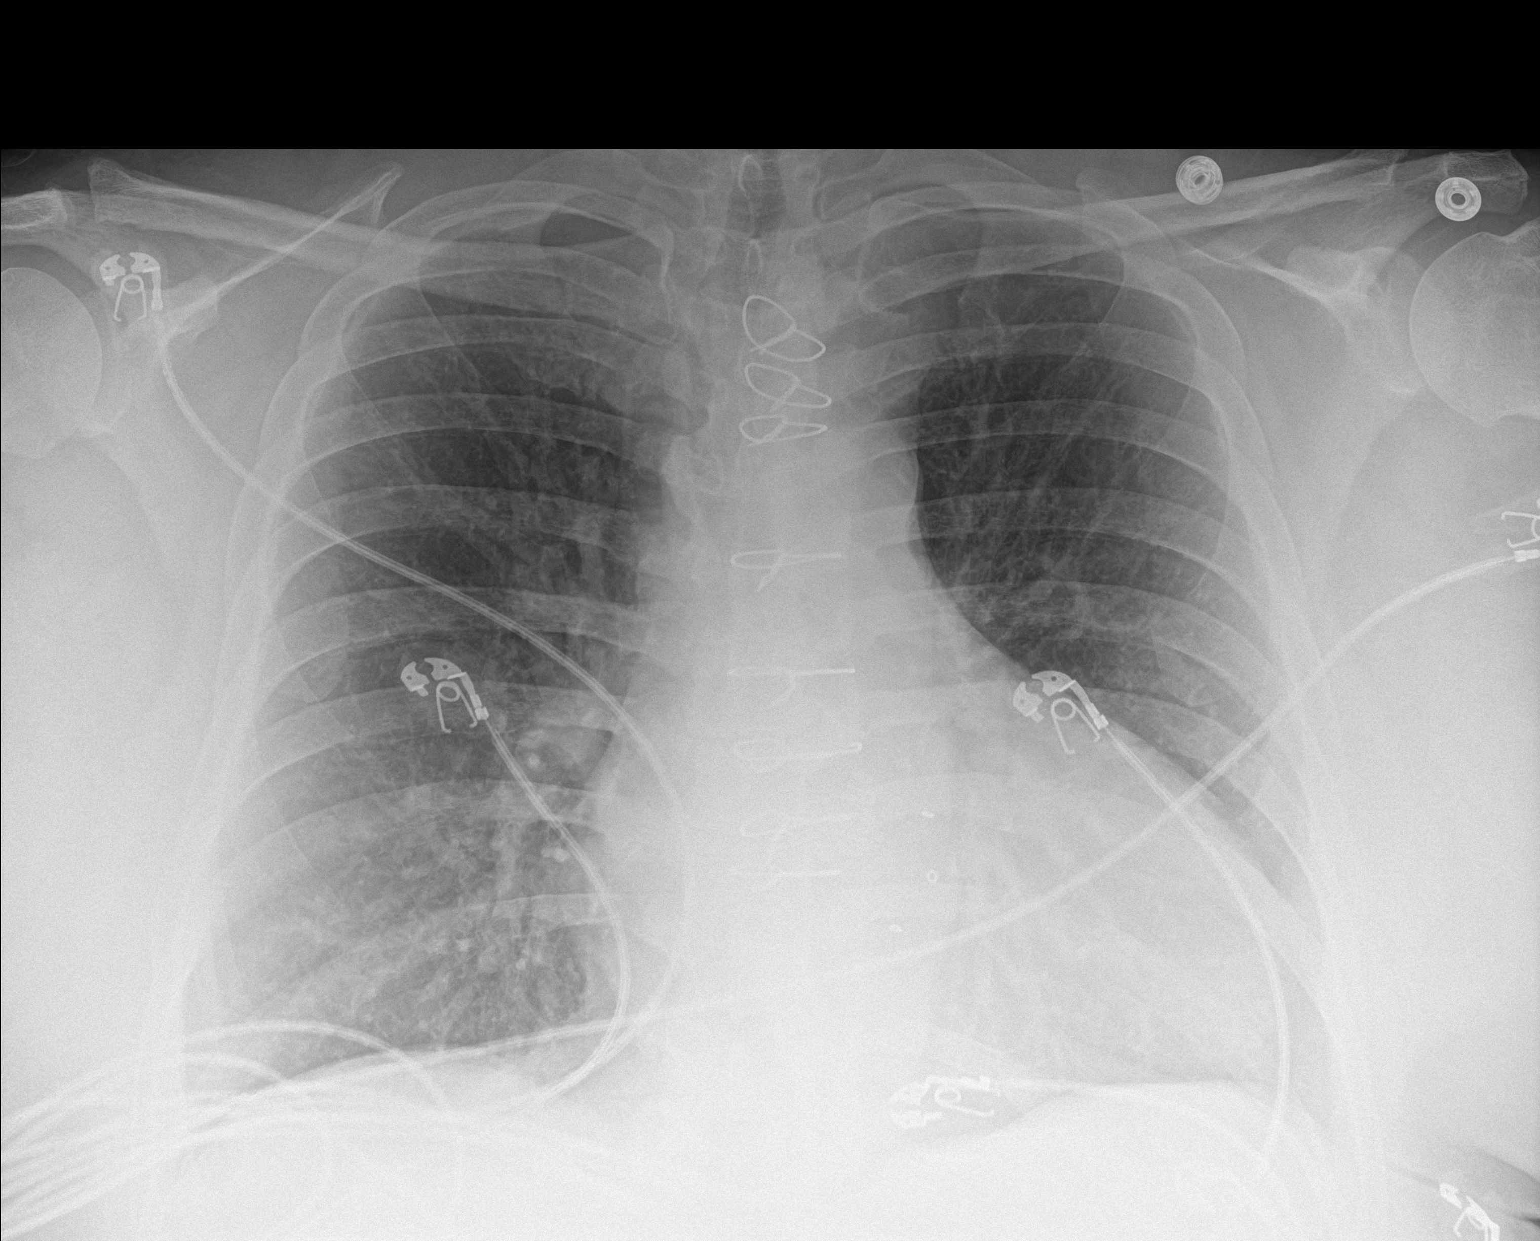

[1 of 1 positions shown; findings below may reference images not displayed]

FINDINGS: 2412 hours. The cardio pericardial silhouette is enlarged. The lungs
are clear without focal pneumonia, edema, pneumothorax or pleural
effusion. The visualized bony structures of the thorax are
unremarkable. Telemetry leads overlie the chest.
IMPRESSION: No active disease.

## 2023-05-07 ENCOUNTER — Other Ambulatory Visit: Payer: Self-pay | Admitting: Internal Medicine

## 2023-05-08 ENCOUNTER — Ambulatory Visit
Admission: RE | Admit: 2023-05-08 | Discharge: 2023-05-08 | Disposition: A | Discharge status: Home / Self Care | Payer: MEDICAID | Source: Ambulatory Visit | Attending: Nephrology | Admitting: Nephrology

## 2023-05-08 DIAGNOSIS — N281 Cyst of kidney, acquired: Secondary | ICD-10-CM

## 2023-05-08 DIAGNOSIS — N1832 Chronic kidney disease, stage 3b: Secondary | ICD-10-CM

## 2023-05-11 ENCOUNTER — Telehealth: Payer: Self-pay

## 2023-05-11 NOTE — Telephone Encounter (Signed)
-----   Message from [facility] sent at 05/10/2023  6:03 PM EDT ----- Will discuss at the upcoming office visit.  Please remind the patient to bring the caridac medication bottles at the next office visit to help facilitate a more accurate medication reconciliation.  Sunit [place], DO, [facility]

## 2023-05-11 NOTE — Telephone Encounter (Signed)
Spoke with patient, instructed to bring in all cardiac medication to next office visit on 10/16 and his monitor results will be discussed with him then by the provider - no questions at this time.

## 2023-05-14 ENCOUNTER — Telehealth: Payer: Self-pay | Admitting: Pharmacy Technician

## 2023-05-14 ENCOUNTER — Other Ambulatory Visit (HOSPITAL_COMMUNITY): Payer: Self-pay

## 2023-05-14 ENCOUNTER — Telehealth: Payer: Self-pay

## 2023-05-14 DIAGNOSIS — I69359 Hemiplegia and hemiparesis following cerebral infarction affecting unspecified side: Secondary | ICD-10-CM

## 2023-05-14 DIAGNOSIS — Z951 Presence of aortocoronary bypass graft: Secondary | ICD-10-CM

## 2023-05-14 NOTE — Telephone Encounter (Signed)
-----   Message from [facility] sent at 05/10/2023 12:57 PM EDT ----- Please follow the Thread.   Sunit Kellogg, DO, [facility]

## 2023-05-14 NOTE — Telephone Encounter (Signed)
Prior auth team, can we see if we can get wegovy approved from CAD indication? Thanks

## 2023-05-14 NOTE — Telephone Encounter (Signed)
Pharmacy Patient Advocate Encounter   Received notification from Pt Calls Messages that prior authorization for wegovy is required/requested.   Insurance verification completed.   The patient is insured through [facility] .   Per test claim: PA required; PA started via CoverMyMeds. KEY [id number] . Waiting for clinical questions to populate.

## 2023-05-14 NOTE — Telephone Encounter (Signed)
See updated med list. Patient is taking Lasix, but is not taking Wegovy. Will also send to PharmD to see if there is still an issue with the prior authorization on Wegovy.

## 2023-05-15 ENCOUNTER — Ambulatory Visit: Payer: Self-pay | Admitting: Cardiology

## 2023-05-15 ENCOUNTER — Other Ambulatory Visit (HOSPITAL_COMMUNITY): Payer: Self-pay

## 2023-05-15 MED ORDER — WEGOVY 0.25 MG/0.5ML ~~LOC~~ SOAJ: 0.2500 mg | SUBCUTANEOUS | 0 refills | Status: DC

## 2023-05-15 NOTE — Telephone Encounter (Signed)
New Message:     Ephriam Knuckles called from [employer]. She said she needs you to get  the patient insurance information and get it back to them please.. She can not process the prior authorization, without this information.

## 2023-05-15 NOTE — Telephone Encounter (Signed)
Pharmacy Patient Advocate Encounter  Received notification from [facility] that Prior Authorization for wegovy has been APPROVED from 05/15/23 to 11/13/23. Ran test claim, Copay is $4.00- one month. This test claim was processed through [facility]- copay amounts may vary at other pharmacies due to pharmacy/plan contracts, or as the patient moves through the different stages of their insurance plan.   PA #/Case ID/Reference #: [id number] T

## 2023-05-15 NOTE — Telephone Encounter (Signed)
I spoke to patient about [facility] approval. He is a little hesitant as he has a fear of needles and doesn't want to feel like a drug addict. He has a hx of TBI and anxiety. He is agreeable to come in to office and review injection and medication. Denies personal or family hx of MENS2 or MTC. Denies hx of pancreatitis. He has been scheduled for apt with pharm d 10/21. Rx called into pharmacy.

## 2023-05-15 NOTE — Addendum Note (Signed)
Addended by: Malena Peer D on: 05/15/2023 03:36 PM   Modules accepted: Orders

## 2023-05-16 ENCOUNTER — Ambulatory Visit: Payer: MEDICAID | Attending: Cardiology | Admitting: Cardiology

## 2023-05-16 ENCOUNTER — Encounter: Payer: Self-pay | Admitting: Cardiology

## 2023-05-16 VITALS — BP 140/82 | HR 62 | Resp 16 | Ht 67.0 in | Wt 244.0 lb

## 2023-05-16 DIAGNOSIS — E66812 Obesity, class 2: Secondary | ICD-10-CM

## 2023-05-16 DIAGNOSIS — I491 Atrial premature depolarization: Secondary | ICD-10-CM | POA: Diagnosis not present

## 2023-05-16 DIAGNOSIS — I129 Hypertensive chronic kidney disease with stage 1 through stage 4 chronic kidney disease, or unspecified chronic kidney disease: Secondary | ICD-10-CM | POA: Diagnosis not present

## 2023-05-16 DIAGNOSIS — N183 Chronic kidney disease, stage 3 unspecified: Secondary | ICD-10-CM

## 2023-05-16 DIAGNOSIS — Z953 Presence of xenogenic heart valve: Secondary | ICD-10-CM | POA: Diagnosis not present

## 2023-05-16 DIAGNOSIS — I5032 Chronic diastolic (congestive) heart failure: Secondary | ICD-10-CM

## 2023-05-16 DIAGNOSIS — Z6838 Body mass index (BMI) 38.0-38.9, adult: Secondary | ICD-10-CM

## 2023-05-16 DIAGNOSIS — E782 Mixed hyperlipidemia: Secondary | ICD-10-CM

## 2023-05-16 NOTE — Progress Notes (Signed)
Cardiology Office Note:  .   Date:  05/16/2023  ID:  John Price, DOB 30-Aug-1970, MRN [id number] PCP:  Eliezer Lofts, MD  Former Cardiology Providers: Rubie Maid [facility]) Nephrologist: Dr. Vallery Sa [facility] Providers Cardiologist:  Tessa Lerner, DO , [facility] (established care 02/28/2021) Electrophysiologist:  None  Click to update primary MD,subspecialty MD or APP then REFRESH:1}    Chief Complaint  Patient presents with   Chronic heart failure with preserved ejection fraction   Follow-up    History of Present Illness: .   John Price is a 52 y.o. African-American male whose past medical history and cardiovascular risk factors includes: History of hemorrhagic stroke (2018) with residual/minimal left-sided weakness, history of bioprosthetic mitral valve replacement (2019), chronic HFpEF, hypertension, hyperlipidemia, history of temporary hemodialysis now chronic kidney disease stage IIIb, former smoker, obesity due to excess calories.   In 2022 she moved to [facility] and was referred to the practice for management of his mitral valve replacement and multiple cardiovascular risk factors.  He also has a history of hemorrhagic stroke in 2018 which resulted into left-sided weakness.  It was felt that the stroke was secondary to uncontrolled hypertension.  In 2019 he underwent mitral valve replacement and since then has been on medical therapy for HFpEF.  He presents today for follow-up after the initiation of Wegovy at the last office visit.  Patient states that he still has not started for [facility]  and he is a bit apprehensive with needles.  But he is currently scheduled to see Pharm.D. for medication education and titration.   Patient also saw his nephrologist Dr. Vallery Sa on 04/26/2023 progress note reviewed.  Review of Systems: .   Review of Systems  Constitutional: Negative for malaise/fatigue.  Cardiovascular:  Positive for dyspnea on  exertion (Chronic and stable). Negative for chest pain, leg swelling, near-syncope, orthopnea, palpitations, paroxysmal nocturnal dyspnea and syncope.  Respiratory:  Positive for shortness of breath (chronic and stable).   Neurological:  Negative for dizziness and vertigo.    Studies Reviewed:   EKG: 03/20/2023: Sinus rhythm, 70bpm, first-degree AV block, left axis, left anterior fascicular block, frequent PACs and atrial bigeminy pattern, without underlying injury pattern.  When compared to prior tracing 09/21/2022 PACs are new.   Echocardiogram: 11/2019 [facility]: LVEF 35%, moderate/severe concentric hypertrophy, moderately reduced global left ventricular systolic function, bioprosthetic mitral valve well-seated, normal position.  Mild TR.  Mild LAE.   03/11/2021: LVEF 45-50%, global hypokinesis, moderate LVH, unable to comment on diastolic function due to mitral valve replacement, RV systolic function reduced (RV S' 7cm/sec and TAPSE 1.47cm), RV size mildly enlarged, normal PASP, mild LAE, bioprosthetic mitral valve in situ, well-seated, normal structure and function.  RAP 8 mmHg.     06/06/2022:  Normal LV systolic function with visual EF 50-55%. Left ventricle cavity is normal in size. Severe concentric hypertrophy of the left ventricle. Normal global wall motion. Indeterminate diastolic filling pattern, elevated LAP.  Bioprosthetic mitral valve. No mitral valve regurgitation. E-wave dominant mitral inflow. Structurally normal tricuspid valve with trace regurgitation. No evidence of pulmonary hypertension. No prior available for comparison.    Lexiscan Tetrofosmin stress test 08/22/2022: 1 Day Rest/Stress protocol.  Stress EKG is non-diagnostic, as this is pharmacological stress test using Lexiscan. Small size, mild intensity, perfusion defect at the apical lateral segment, suggestive of ischemia at distal RCA/LCX distribution.  Overall accuracy of the study is limited due to  decreased trace uptake in  the inferior and inferolateral segments more pronounced at rest as this is likely due to soft tissue attenuation artifact given his BMI of 41 and images performed in sitting position. Ischemia in this region cannot be excluded. No evidence of prior infarct.  Left ventricular size is dilated, LVEF 35%, mild global hypokinesis.  No prior studies for comparison  Clinical correlation is required.  Intermediate risk.  Cardiac monitor (Zio Patch): 04/20/2023 -04/24/2023 Dominant rhythm sinus, followed by bradycardia (16%). Heart rate 39-108 bpm.  Avg HR 67 bpm. No atrial fibrillation detected during the monitoring period. No supraventricular tachycardia, ventricular tachycardia, high grade AV block, pauses (3 seconds or longer). Total supraventricular ectopic burden 5.1%. Total ventricular ectopic burden <1%. Patient triggered events: 21.  Underlying rhythm predominantly sinus with PACs/PVCs and baseline artifact.  RADIOLOGY: N/A  Risk Assessment/Calculations:   N/A   Labs:       Latest Ref Rng & Units 03/27/2023    7:49 PM 02/02/2023    1:53 PM 11/30/2022   12:27 PM  CBC  WBC 4.0 - 10.5 K/uL 5.4  5.3  5.3   Hemoglobin 13.0 - 17.0 g/dL 04.5  40.9  81.1   Hematocrit 39.0 - 52.0 % 45.5  43.0  43.9   Platelets 150 - 400 K/uL 237  241  283        Latest Ref Rng & Units 04/24/2023   12:51 PM 04/24/2023   12:50 PM 03/27/2023    7:49 PM  BMP  Glucose 70 - 99 mg/dL 914  782  956   BUN 6 - 24 mg/dL 18  19  21    Creatinine 0.76 - 1.27 mg/dL 2.13  0.86  5.78   BUN/Creat Ratio 9 - 20 8  9     Sodium 134 - 144 mmol/L 143  143  139   Potassium 3.5 - 5.2 mmol/L 4.2  4.2  3.3   Chloride 96 - 106 mmol/L 104  104  103   CO2 20 - 29 mmol/L 24  24  23    Calcium 8.7 - 10.2 mg/dL 46.9  62.9  9.3       Latest Ref Rng & Units 04/24/2023   12:51 PM 04/24/2023   12:50 PM 03/27/2023    7:49 PM  CMP  Glucose 70 - 99 mg/dL 528  413  244   BUN 6 - 24 mg/dL 18  19  21     Creatinine 0.76 - 1.27 mg/dL 0.10  2.72  5.36   Sodium 134 - 144 mmol/L 143  143  139   Potassium 3.5 - 5.2 mmol/L 4.2  4.2  3.3   Chloride 96 - 106 mmol/L 104  104  103   CO2 20 - 29 mmol/L 24  24  23    Calcium 8.7 - 10.2 mg/dL 64.4  03.4  9.3   Total Protein 6.0 - 8.5 g/dL 7.5   7.7   Total Bilirubin 0.0 - 1.2 mg/dL 0.7   1.3   Alkaline Phos 44 - 121 IU/L 85   59   AST 0 - 40 IU/L 23   22   ALT 0 - 44 IU/L 31   31     Lab Results  Component Value Date   CHOL 138 04/24/2023   HDL 43 04/24/2023   LDLCALC 79 04/24/2023   LDLDIRECT 82 04/24/2023   TRIG 83 04/24/2023   No results for input(s): "LIPOA" in the last 8760 hours. No components found for: "NTPROBNP" No results for input(s): "  PROBNP" in the last 8760 hours. No results for input(s): "TSH" in the last 8760 hours.  Physical Exam:    Today's Vitals   05/16/23 1048 05/16/23 1100  BP: (!) 150/96 (!) 140/82  Pulse: (!) 57 62  Resp: 16   SpO2: 93%   Weight: 244 lb (110.7 kg)   Height: 5\' 7"  (1.702 m)    Body mass index is 38.22 kg/m. Wt Readings from Last 3 Encounters:  05/16/23 244 lb (110.7 kg)  03/20/23 245 lb (111.1 kg)  03/08/23 250 lb 9.6 oz (113.7 kg)    Physical Exam  Constitutional: No distress. He appears chronically ill.  hemodynamically stable, ambulates with a cane.  HENT:  Arcus senilis  Neck: No JVD present.  Cardiovascular: Normal rate, regular rhythm, S1 normal, S2 normal, intact distal pulses and normal pulses. Exam reveals no gallop, no S3 and no S4.  Murmur heard. Systolic murmur is present radiating to the apex. Pulses:      Posterior tibial pulses are 2+ on the right side and 2+ on the left side.  Pulmonary/Chest: Effort normal and breath sounds normal. No stridor. He has no wheezes. He has no rales.  Abdominal: Soft. Bowel sounds are normal. He exhibits no distension. There is no abdominal tenderness.  Musculoskeletal:        General: No edema.     Cervical back: Neck supple.   Neurological: He is alert and oriented to person, place, and time. He has intact cranial nerves (2-12).  5+ right upper and lower extremity strength, 3+ left upper and lower extremity strength. Normal muscle tone.   Skin: Skin is warm and moist.    Impression & Recommendation(s):  Impression:   ICD-10-CM   1. Chronic heart failure with preserved ejection fraction (HFpEF) (HCC)  I50.32     2. Premature atrial contraction  I49.1     3. History of mitral valve replacement with bioprosthetic valve  Z95.3     4. Benign hypertension with chronic kidney disease, stage III (HCC)  I12.9    N18.30     5. Mixed hyperlipidemia  E78.2     6. Class 2 severe obesity due to excess calories with serious comorbidity and body mass index (BMI) of 38.0 to 38.9 in adult [facility])  Z61.096    E66.01    Z68.38        Recommendation(s):  Chronic heart failure with preserved ejection fraction (HFpEF) [facility]) May 2021: LVEF 35%, see report for additional details. August 2022: LVEF 45-50%, see report for additional details. November 2023: LVEF 50-55% Clinically euvolemic. Stage C, NYHA class II Medications are difficult to reconcile as he does not bring a medication list or bottles. Blood pressures are acceptable but not at goal.  We discussed up titration of medical therapy but states at times he feels lightheaded and dizzy and also has vertigo.  Will hold off on medical therapy at this time. I suspect that his blood pressure should improve once he gets started on Wegovy and experiences weight loss. Continue to monitor for now  Premature atrial contraction Patient has Zio patch since last office visit. Average heart rate of 67 bpm with a tachycardia burden of 16%.   Patient triggered events noted predominantly sinus rhythm with PACs/PVCs. PSVT burden approximately 5.1%. Already on carvedilol. We discussed up titration of medical therapy; however, at times he will be feels lightheaded and dizziness and  therefore we will hold off on up titration of medical therapy at this time.  History of mitral valve replacement with bioprosthetic valve Recent echo November 2023. Continue aspirin. He is aware of the importance of endocarditis prophylaxis prior to dental procedures  Benign hypertension with chronic kidney disease, stage III (HCC) Office blood pressures are acceptable not at goal. Will hold off on up titration of medications for the reasons mentioned above. He did have an appointment with his nephrologist since last office visit.  He was recommended to take Lasix on MWF but he chooses be on a daily  Mixed hyperlipidemia Recent lipids independently reviewed. Continue atorvastatin 40 mg p.o. daily. Hopeful that his lipid panel will improve once uptitration of Wegovy. Hold off on up titration of medical therapy at this time.  However if needed consider Zetia 10 mg p.o. daily  Orders Placed:  No orders of the defined types were placed in this encounter.  As part of medical decision making Zio patch results, labs September 2024, Dr. Essie Hart progress note dated 04/26/2023 were dependently reviewed as part of today's encounter.  Final Medication List:   No orders of the defined types were placed in this encounter.   There are no discontinued medications.   Current Outpatient Medications:    albuterol (VENTOLIN HFA) 108 (90 Base) MCG/ACT inhaler, 2 puffs every 6 hours as needed for wheezing, Disp: 18 g, Rfl: 1   aspirin 81 MG chewable tablet, 1 tab by mouth daily with morning meal, Disp: 30 tablet, Rfl: 11   atorvastatin (LIPITOR) 40 MG tablet, 1 tab by mouth with evening meal., Disp: 30 tablet, Rfl: 11   carvedilol (COREG) 25 MG tablet, TAKE 1 TABLET BY MOUTH 2 TIMES DAILY WITH MEALS, Disp: 60 tablet, Rfl: 0   cetirizine (ZYRTEC) 10 MG tablet, TAKE 1 TABLET(10 MG) BY MOUTH DAILY, Disp: 30 tablet, Rfl: 5   empagliflozin (JARDIANCE) 10 MG TABS tablet, Take 10 mg by mouth daily., Disp:  , Rfl:    famotidine (PEPCID) 40 MG tablet, Take 40 mg by mouth at bedtime., Disp: , Rfl:    furosemide (LASIX) 40 MG tablet, 1 tab by mouth with morning meal, Disp: 30 tablet, Rfl: 11   hydrOXYzine (ATARAX) 50 MG tablet, Take 50 mg by mouth in the morning and at bedtime., Disp: , Rfl:    ipratropium (ATROVENT) 0.06 % nasal spray, Use 1-2 sprays per nostril 3-4 times daily as needed, Disp: 15 mL, Rfl: 5   isosorbide mononitrate (IMDUR) 60 MG 24 hr tablet, Take 1 tablet (60 mg total) by mouth daily., Disp: 90 tablet, Rfl: 0   meclizine (ANTIVERT) 25 MG tablet, Take 25 mg by mouth 3 (three) times daily as needed for dizziness., Disp: , Rfl:    omeprazole (PRILOSEC) 40 MG capsule, Take 40 mg by mouth daily., Disp: , Rfl:    potassium chloride SA (KLOR-CON) 20 MEQ tablet, Take 20 mEq by mouth daily., Disp: , Rfl:    sacubitril-valsartan (ENTRESTO) 49-51 MG, Take 1 tablet by mouth 2 (two) times daily., Disp: , Rfl:    SYMBICORT 160-4.5 MCG/ACT inhaler, Inhale 2 puffs into the lungs 2 (two) times daily., Disp: 10.2 g, Rfl: 5   Semaglutide-Weight Management (WEGOVY) 0.25 MG/0.5ML SOAJ, Inject 0.25 mg into the skin once a week., Disp: 2 mL, Rfl: 0   sucralfate (CARAFATE) 1 g tablet, Take 1 tablet (1 g total) by mouth 2 (two) times daily for 14 days. (Patient taking differently: Take 1 g by mouth at bedtime.), Disp: 28 tablet, Rfl: 0  Consent:   NA  Disposition:  Follow-up with APP in 6 months given HFpEF, history of mitral valve replacement, lipids  and myself in 1 year.  His questions and concerns were addressed to his satisfaction. He voices understanding of the recommendations provided during this encounter.    Signed, Tessa Lerner, DO, [facility]  [facility]  [address] [place], [place] [address] 05/16/2023 11:49 AM

## 2023-05-16 NOTE — Patient Instructions (Addendum)
Medication Instructions:  Your physician recommends that you continue on your current medications as directed. Please refer to the Current Medication list given to you today.  *If you need a refill on your cardiac medications before your next appointment, please call your pharmacy*  Lab Work: None ordered If you have labs (blood work) drawn today and your tests are completely normal, you will receive your results only by: MyChart Message (if you have MyChart) OR A paper copy in the mail If you have any lab test that is abnormal or we need to change your treatment, we will call you to review the results.  Testing/Procedures: None ordered  Follow-Up: At [facility], you and your health needs are our priority.  As part of our continuing mission to provide you with exceptional heart care, we have created designated Provider Care Teams.  These Care Teams include your primary Cardiologist (physician) and Advanced Practice Providers (APPs -  Physician Assistants and Nurse Practitioners) who all work together to provide you with the care you need, when you need it.  We recommend signing up for the patient portal called "MyChart".  Sign up information is provided on this After Visit Summary.  MyChart is used to connect with patients for Virtual Visits (Telemedicine).  Patients are able to view lab/test results, encounter notes, upcoming appointments, etc.  Non-urgent messages can be sent to your provider as well.   To learn more about what you can do with MyChart, go to [email or web address].    Your next appointment:   6 month(s)  The format for your next appointment:   In Person  Provider:   Jari Favre, PA-C, Ronie Spies, PA-C, Robin Searing, NP, Eligha Bridegroom, NP, Tereso Newcomer, PA-C, or Perlie Gold, PA-C. Then, [employer], DO will plan to see you again in 1 year(s).{  Other Instructions PharmD appointment regarding [facility] 10/21

## 2023-05-21 ENCOUNTER — Ambulatory Visit: Payer: MEDICAID | Attending: Cardiovascular Disease | Admitting: Pharmacist

## 2023-05-21 VITALS — Wt 246.0 lb

## 2023-05-21 DIAGNOSIS — I69359 Hemiplegia and hemiparesis following cerebral infarction affecting unspecified side: Secondary | ICD-10-CM

## 2023-05-21 DIAGNOSIS — E66812 Obesity, class 2: Secondary | ICD-10-CM

## 2023-05-21 DIAGNOSIS — Z951 Presence of aortocoronary bypass graft: Secondary | ICD-10-CM

## 2023-05-21 DIAGNOSIS — Z6838 Body mass index (BMI) 38.0-38.9, adult: Secondary | ICD-10-CM

## 2023-05-21 NOTE — Progress Notes (Signed)
Patient ID: PACKER PAMPLONA                 DOB: 1971/04/26                    MRN: [id number]     HPI: John Price is a 52 y.o. male patient referred to pharmacy clinic by Dr. Odis Hollingshead to initiate GLP1-RA therapy. PMH is significant for hemorrhagic stroke (2018) with residual/minimal left-sided weakness, history of bioprosthetic mitral valve replacement (2019), chronic HFpEF, hypertension, hyperlipidemia, history of temporary hemodialysis now chronic kidney disease stage IIIb, former smoker, TBI, anxiety and obesity. Most recent BMI 38.53 kg/m2.  At his last visit with Dr.Tolia, he expressed apprehension with starting his prescribed Wegovy due to concerns with using needles on his own. Patient was educated on the importance of weight loss for helping to control his other cardiovascular conditions. He was scheduled to follow-up with pharmacy clinic.  At this visit, we provided education and demonstration of [facility] pen. Patient was interested is starting and would like for [place] to ensure that he can pick it up at the pharmacy close to his new apartment, but he was unsure of the name and address of the pharmacy. At this visit we were able to talk with his social worker Felipa Eth) to try to ascertain more information on his new residence and better assess the patient's ability to safely self-administer the medication. We agreed as a group to hold off on starting this medication until he was in his new place of residence where he would have access to nurses that could help him with administration of Wegovy.  Diet:  Breakfast-cereal (honey-nut cheerios) with whole milk Lunch-deli meat sandwich with cheese Dinner-frozen Hungry Man meals Drink-ginger ale x3-4/day, apple juice, water Snacks-potato chips, fresh fruit (pineapple), fruit snacks  Exercise:  Limited mobility with hx of stroke Walks most days outdoors for variable amounts of time   Labs: Lab Results  Component Value Date   HGBA1C  5.2 05/02/2021    Wt Readings from Last 1 Encounters:  05/16/23 244 lb (110.7 kg)    BP Readings from Last 1 Encounters:  05/16/23 (!) 140/82   Pulse Readings from Last 1 Encounters:  05/16/23 62       Component Value Date/Time   CHOL 138 04/24/2023 1250   TRIG 83 04/24/2023 1250   HDL 43 04/24/2023 1250   LDLCALC 79 04/24/2023 1250   LDLDIRECT 82 04/24/2023 1251    Past Medical History:  Diagnosis Date   Asthma    CHF (congestive heart failure) (HCC)    Chronic kidney disease    Coronary artery disease    Hyperlipidemia    Hypertension    Primary hypertension 12/28/2020   Stroke [facility]     Current Outpatient Medications on File Prior to Visit  Medication Sig Dispense Refill   albuterol (VENTOLIN HFA) 108 (90 Base) MCG/ACT inhaler 2 puffs every 6 hours as needed for wheezing 18 g 1   aspirin 81 MG chewable tablet 1 tab by mouth daily with morning meal 30 tablet 11   atorvastatin (LIPITOR) 40 MG tablet 1 tab by mouth with evening meal. 30 tablet 11   carvedilol (COREG) 25 MG tablet TAKE 1 TABLET BY MOUTH 2 TIMES DAILY WITH MEALS 60 tablet 0   cetirizine (ZYRTEC) 10 MG tablet TAKE 1 TABLET(10 MG) BY MOUTH DAILY 30 tablet 5   empagliflozin (JARDIANCE) 10 MG TABS tablet Take 10 mg by mouth daily.  famotidine (PEPCID) 40 MG tablet Take 40 mg by mouth at bedtime.     furosemide (LASIX) 40 MG tablet 1 tab by mouth with morning meal 30 tablet 11   hydrOXYzine (ATARAX) 50 MG tablet Take 50 mg by mouth in the morning and at bedtime.     ipratropium (ATROVENT) 0.06 % nasal spray Use 1-2 sprays per nostril 3-4 times daily as needed 15 mL 5   isosorbide mononitrate (IMDUR) 60 MG 24 hr tablet Take 1 tablet (60 mg total) by mouth daily. 90 tablet 0   meclizine (ANTIVERT) 25 MG tablet Take 25 mg by mouth 3 (three) times daily as needed for dizziness.     omeprazole (PRILOSEC) 40 MG capsule Take 40 mg by mouth daily.     potassium chloride SA (KLOR-CON) 20 MEQ tablet Take 20 mEq by  mouth daily.     sacubitril-valsartan (ENTRESTO) 49-51 MG Take 1 tablet by mouth 2 (two) times daily.     Semaglutide-Weight Management (WEGOVY) 0.25 MG/0.5ML SOAJ Inject 0.25 mg into the skin once a week. 2 mL 0   sucralfate (CARAFATE) 1 g tablet Take 1 tablet (1 g total) by mouth 2 (two) times daily for 14 days. (Patient taking differently: Take 1 g by mouth at bedtime.) 28 tablet 0   SYMBICORT 160-4.5 MCG/ACT inhaler Inhale 2 puffs into the lungs 2 (two) times daily. 10.2 g 5   No current facility-administered medications on file prior to visit.    Allergies  Allergen Reactions   Gabapentin Anaphylaxis   Other     "States a narcotic made him feel like he wouldn't wake up"    Assessment/Plan:  1. Weight loss -   Assessment: Patient would benefit from weight loss to reduce CV morbidity/mortality risk Patient diet has plenty of room for optimization through reducing total calories, eliminating refined sugars and carbohydrates, and introduction of foods higher in protein and fiber that may be more filling While patient is a candidate for GLP-1 therapy, confirmation that the medication can be administered safely and appropriately is warranted  Plan: Encouraged patient to make the following adjustments to his diet: reduce cans of ginger ale to 1/day, eliminate snacking on potato chips, and if consuming frozen meals then look for low-sodium options that are lower in calories Encouraged the patient to enroll in a specific [employer] program that provides lessons healthy cooking and appropriate exercise; passed this information along to his social worker Initiate GLP-1 therapy with Wegovy at the end of the month or whenever the patient is settled in to his new apartment Will follow-up the patient and social worker at the end of the month to determine if an additional prescription needs to be sent to a different pharmacy   Best,  Wilmer Floor, PharmD PGY2 Cardiology Pharmacy Resident

## 2023-05-21 NOTE — Patient Instructions (Addendum)
I will refer you to the [facility] prep class. Someone will reach out to you to help you set up a time for classes The prescription for Reginal Lutes is at [facility] We will wait to start this medication until the nurse at [facility] can review with you I will call you the first week of November to follow up

## 2023-05-21 NOTE — Progress Notes (Unsigned)
Patient ID: John Price                 DOB: [birth year]                    MRN: [id number]     HPI: John Price is a 52 y.o. male patient referred to pharmacy clinic by Dr. Odis Hollingshead to initiate GLP1-RA therapy. PMH is significant for hemorrhagic stroke (2018) with residual/minimal left-sided weakness, history of bioprosthetic mitral valve replacement (2019), chronic HFpEF, hypertension, hyperlipidemia, CABG, history of temporary hemodialysis now chronic kidney disease stage IIIb, former smoker, TBI, anxiety and obesity. Most recent BMI ***.  Aldean Jewett [phone] Vikki Ports Social for [employer] team [employer]   Baseline weight and BMI: *** Current weight and BMI: *** Current meds that affect weight: ****  *** If diabetic and on insulin/sulfonylurea, can consider reducing dose to reduce risk of hypoglycemia  *** Follow-up visit  Assess % weight loss Assess adverse effects Missed doses  Diet:  Breakfast: honey nut cherrios, cinnamon life- whole milk Lunch: deli meat sandwich Snack: fruit, fruit snacks, candy, chips  Dinner: hungry man frozen dinner Drink: water, apple juice, soda (2-3 gingerale per day)  Exercise:   Family History:   Social History:   Labs: Lab Results  Component Value Date   HGBA1C 5.2 05/02/2021    Wt Readings from Last 1 Encounters:  05/16/23 244 lb (110.7 kg)    BP Readings from Last 1 Encounters:  05/16/23 (!) 140/82   Pulse Readings from Last 1 Encounters:  05/16/23 62       Component Value Date/Time   CHOL 138 04/24/2023 1250   TRIG 83 04/24/2023 1250   HDL 43 04/24/2023 1250   LDLCALC 79 04/24/2023 1250   LDLDIRECT 82 04/24/2023 1251    Past Medical History:  Diagnosis Date   Asthma    CHF (congestive heart failure) (HCC)    Chronic kidney disease    Coronary artery disease    Hyperlipidemia    Hypertension    Primary hypertension 12/28/2020   Stroke [facility])     Current Outpatient Medications on File Prior to Visit   Medication Sig Dispense Refill   albuterol (VENTOLIN HFA) 108 (90 Base) MCG/ACT inhaler 2 puffs every 6 hours as needed for wheezing 18 g 1   aspirin 81 MG chewable tablet 1 tab by mouth daily with morning meal 30 tablet 11   atorvastatin (LIPITOR) 40 MG tablet 1 tab by mouth with evening meal. 30 tablet 11   carvedilol (COREG) 25 MG tablet TAKE 1 TABLET BY MOUTH 2 TIMES DAILY WITH MEALS 60 tablet 0   cetirizine (ZYRTEC) 10 MG tablet TAKE 1 TABLET(10 MG) BY MOUTH DAILY 30 tablet 5   empagliflozin (JARDIANCE) 10 MG TABS tablet Take 10 mg by mouth daily.     famotidine (PEPCID) 40 MG tablet Take 40 mg by mouth at bedtime.     furosemide (LASIX) 40 MG tablet 1 tab by mouth with morning meal 30 tablet 11   hydrOXYzine (ATARAX) 50 MG tablet Take 50 mg by mouth in the morning and at bedtime.     ipratropium (ATROVENT) 0.06 % nasal spray Use 1-2 sprays per nostril 3-4 times daily as needed 15 mL 5   isosorbide mononitrate (IMDUR) 60 MG 24 hr tablet Take 1 tablet (60 mg total) by mouth daily. 90 tablet 0   meclizine (ANTIVERT) 25 MG tablet Take 25 mg by mouth 3 (three) times daily as needed  for dizziness.     omeprazole (PRILOSEC) 40 MG capsule Take 40 mg by mouth daily.     potassium chloride SA (KLOR-CON) 20 MEQ tablet Take 20 mEq by mouth daily.     sacubitril-valsartan (ENTRESTO) 49-51 MG Take 1 tablet by mouth 2 (two) times daily.     Semaglutide-Weight Management (WEGOVY) 0.25 MG/0.5ML SOAJ Inject 0.25 mg into the skin once a week. 2 mL 0   sucralfate (CARAFATE) 1 g tablet Take 1 tablet (1 g total) by mouth 2 (two) times daily for 14 days. (Patient taking differently: Take 1 g by mouth at bedtime.) 28 tablet 0   SYMBICORT 160-4.5 MCG/ACT inhaler Inhale 2 puffs into the lungs 2 (two) times daily. 10.2 g 5   No current facility-administered medications on file prior to visit.    Allergies  Allergen Reactions   Gabapentin Anaphylaxis   Other     "States a narcotic made him feel like he  wouldn't wake up"     Assessment/Plan:  1. Weight loss - Patient has not met goal of at least 5% of body weight loss with comprehensive lifestyle modifications alone in the past 3-6 months. Pharmacotherapy is appropriate to pursue as augmentation. Will start ***. Confirmed patient not ***pregnant and no personal or family history of medullary thyroid carcinoma (MTC) or Multiple Endocrine Neoplasia syndrome type 2 (MEN 2). Injection technique reviewed at today's visit.  Advised patient on common side effects including nausea, diarrhea, dyspepsia, decreased appetite, and fatigue. Counseled patient on reducing meal size and how to titrate medication to minimize side effects. Counseled patient to call if intolerable side effects or if experiencing dehydration, abdominal pain, or dizziness. Patient will adhere to dietary modifications and will target at least 150 minutes of moderate intensity exercise weekly.   Follow up in 1 month via telephone for tolerability update and dose titration.

## 2023-05-22 DIAGNOSIS — E669 Obesity, unspecified: Secondary | ICD-10-CM | POA: Insufficient documentation

## 2023-06-06 ENCOUNTER — Ambulatory Visit: Payer: MEDICAID | Admitting: Allergy

## 2023-07-09 ENCOUNTER — Telehealth: Payer: Self-pay | Admitting: Pharmacist

## 2023-07-09 DIAGNOSIS — I69359 Hemiplegia and hemiparesis following cerebral infarction affecting unspecified side: Secondary | ICD-10-CM

## 2023-07-09 DIAGNOSIS — R072 Precordial pain: Secondary | ICD-10-CM

## 2023-07-09 DIAGNOSIS — I10 Essential (primary) hypertension: Secondary | ICD-10-CM

## 2023-07-09 DIAGNOSIS — Z951 Presence of aortocoronary bypass graft: Secondary | ICD-10-CM

## 2023-07-09 NOTE — Telephone Encounter (Signed)
Spoke to [place] who said if patient still wants to do [place] then they have the resources to help him. Requested his Rx be sent to [facility] pharmacy on [address] She requested all cardiac meds be sent there, however there is some uncertainty to what exactly he is taking.

## 2023-07-09 NOTE — Telephone Encounter (Signed)
Called social worker Technical sales engineer for [employer] team [employer] [phone] )  Following up to see if RN thought patient would be able to do [facility]. LVM

## 2023-07-10 MED ORDER — EMPAGLIFLOZIN 10 MG PO TABS: 10.0000 mg | ORAL_TABLET | Freq: Every day | ORAL | 11 refills | Status: DC

## 2023-07-10 MED ORDER — CARVEDILOL 25 MG PO TABS: ORAL_TABLET | ORAL | 5 refills | Status: AC

## 2023-07-10 MED ORDER — ATORVASTATIN CALCIUM 40 MG PO TABS: ORAL_TABLET | ORAL | 11 refills | Status: AC

## 2023-07-10 MED ORDER — POTASSIUM CHLORIDE CRYS ER 20 MEQ PO TBCR: 20.0000 meq | EXTENDED_RELEASE_TABLET | Freq: Every day | ORAL | 5 refills | Status: AC

## 2023-07-10 MED ORDER — SACUBITRIL-VALSARTAN 49-51 MG PO TABS: 1.0000 | ORAL_TABLET | Freq: Two times a day (BID) | ORAL | 11 refills | Status: AC

## 2023-07-10 MED ORDER — ISOSORBIDE MONONITRATE ER 60 MG PO TB24: 60.0000 mg | ORAL_TABLET | Freq: Every day | ORAL | 5 refills | Status: AC

## 2023-07-10 MED ORDER — ASPIRIN 81 MG PO CHEW: CHEWABLE_TABLET | ORAL | 11 refills | Status: AC

## 2023-07-10 MED ORDER — FUROSEMIDE 40 MG PO TABS: ORAL_TABLET | ORAL | 11 refills | Status: AC

## 2023-07-10 NOTE — Telephone Encounter (Addendum)
I spoke with patient and confirmed the medications on his med list. Patient stated he was on allopurinol but did not know the dose. Added to med list. Rx for his cardiac meds were sent to [facility]. He has Wegovy at home. Said he picked up the other day. He expressed concern bc his nurse keeps going on vacation and he doesn't know when he will come. We reviewed injection technique and location and frequency. I will follow up with him in a few weeks.

## 2023-07-10 NOTE — Addendum Note (Signed)
Addended by: Malena Peer D on: 07/10/2023 12:06 PM   Modules accepted: Orders

## 2023-08-02 NOTE — Telephone Encounter (Signed)
 Patient has not started [facility]. Waiting on RN to show him how to use it. I gave patient my phone # and advised him to call when he starts.

## 2023-08-24 ENCOUNTER — Other Ambulatory Visit: Payer: Self-pay | Admitting: Cardiology

## 2023-08-24 DIAGNOSIS — I69359 Hemiplegia and hemiparesis following cerebral infarction affecting unspecified side: Secondary | ICD-10-CM

## 2023-08-24 DIAGNOSIS — Z951 Presence of aortocoronary bypass graft: Secondary | ICD-10-CM

## 2023-08-27 ENCOUNTER — Encounter: Payer: Self-pay | Admitting: Pharmacist

## 2023-08-27 NOTE — Telephone Encounter (Signed)
error

## 2023-08-27 NOTE — Telephone Encounter (Signed)
Call patient to see if he is ready to up the dose to 0.5 mg for [facility]. N/A LVM

## 2023-09-19 ENCOUNTER — Ambulatory Visit: Payer: MEDICAID | Admitting: Allergy

## 2023-09-19 ENCOUNTER — Other Ambulatory Visit: Payer: Self-pay

## 2023-09-19 ENCOUNTER — Encounter: Payer: Self-pay | Admitting: Allergy

## 2023-09-19 ENCOUNTER — Ambulatory Visit (INDEPENDENT_AMBULATORY_CARE_PROVIDER_SITE_OTHER): Payer: MEDICAID | Admitting: Allergy

## 2023-09-19 VITALS — BP 120/88 | HR 73 | Temp 97.9°F | Resp 19 | Ht 65.0 in | Wt 246.3 lb

## 2023-09-19 DIAGNOSIS — J3089 Other allergic rhinitis: Secondary | ICD-10-CM

## 2023-09-19 DIAGNOSIS — R0982 Postnasal drip: Secondary | ICD-10-CM

## 2023-09-19 DIAGNOSIS — G4733 Obstructive sleep apnea (adult) (pediatric): Secondary | ICD-10-CM

## 2023-09-19 DIAGNOSIS — J4530 Mild persistent asthma, uncomplicated: Secondary | ICD-10-CM

## 2023-09-19 MED ORDER — ALBUTEROL SULFATE HFA 108 (90 BASE) MCG/ACT IN AERS: INHALATION_SPRAY | RESPIRATORY_TRACT | 1 refills | Status: AC

## 2023-09-19 MED ORDER — BREZTRI AEROSPHERE 160-9-4.8 MCG/ACT IN AERO: 2.0000 | INHALATION_SPRAY | Freq: Two times a day (BID) | RESPIRATORY_TRACT | 5 refills | Status: AC

## 2023-09-19 MED ORDER — CETIRIZINE HCL 10 MG PO TABS: 10.0000 mg | ORAL_TABLET | Freq: Every day | ORAL | 5 refills | Status: AC

## 2023-09-19 MED ORDER — IPRATROPIUM BROMIDE 0.06 % NA SOLN: NASAL | 5 refills | Status: AC

## 2023-09-19 NOTE — Patient Instructions (Addendum)
-   Continue avoidance for dust mites and cockroach. - Continue  taking:  Zyrtec (cetirizine) 10mg  tablet once daily.   Atrovent (ipratropium) 0.06% 1-2 spray per nostril 3-4 times daily as needed for nasal drainage or congestion - You can use an extra dose of the antihistamine, if needed, for breakthrough symptoms.  - Continue to perform nasal saline rinses  - Daily controller medication:  Breztri 2 puffs twice a day (yellow inhaler).  [name] is a step-up inhaler from Symbicort so should improve your asthma symptom control.   - Rescue medications: albuterol 2 puffs every 4-6 hours as needed   - Asthma control goals:  * Full participation in all desired activities (may need albuterol before activity) * Albuterol use two time or less a week on average (not counting use with activity) * Cough interfering with sleep two time or less a month * Oral steroids no more than once a year * No hospitalizations  - Continue use of CPAP as directed by your pulmonologist or sleep medicine doctor for your CPAP  - Will place an ENT referral for you to help with your control of vertigo.  We will call you regarding this referral to schedule appointment  Follow-up in 3-4 months or sooner if needed

## 2023-09-19 NOTE — Progress Notes (Signed)
Follow-up Note  RE: John Price MRN: [id number] DOB: Jul 03, 1971 Date of Office Visit: 09/19/2023   History of present illness: John Price is a 53 y.o. male presenting today for follow-up of asthma, allergic rhinitis.  He also has sleep apnea using CPAP. He was last seen in the office on 03/08/23 by myself.  Discussed the use of AI scribe software for clinical note transcription with the patient, who gave verbal consent to proceed.  He has been experiencing fluctuating allergy symptoms since August, including rhinorrhea, nasal congestion, sneezing, and pruritic, watery eyes. These symptoms vary in intensity, with some days being better than others. He uses a nasal spray twice daily (atrovent), which helps manage his nasal symptoms, and takes cetirizine regularly for allergy control.  His asthma symptoms are influenced by his level of activity. He uses an albuterol inhaler as needed and is currently still using Symbicort. However, there is some confusion about his inhaler regimen, as I recommended at last visit to switch to [facility], a yellow inhaler.  He states upon looking at pictures that he has used a yellow inhaler but also has a white/clear inhaler that he was told was for COPD.  Breathing difficulties are more pronounced when multitasking or physically active.  He experiences episodes of dizziness, described as room spinning and 'ping, ping, ping' in his eyes,which then leads to more anxiety. These episodes sometimes occur during sleep, causing him to wake up disoriented. He has a history of vertigo and has previously seen a neurologist, but found the treatment ineffective and was concerned about medication side effects.  He has meclizine that he uses as needed.   He mentions having a support system through a telehealth plan, which includes a nurse who assists him with managing his health needs.      Review of systems: 10pt ROS negative unless noted above in HPI   All  other systems negative unless noted above in HPI  Past medical/social/surgical/family history have been reviewed and are unchanged unless specifically indicated below.  No changes  Medication List: Current Outpatient Medications  Medication Sig Dispense Refill   allopurinol (ZYLOPRIM) 100 MG tablet Take 100 mg by mouth daily. Patient did not know the dose     aspirin 81 MG chewable tablet 1 tab by mouth daily with morning meal 30 tablet 11   atorvastatin (LIPITOR) 40 MG tablet 1 tab by mouth with evening meal. 30 tablet 11   Budeson-Glycopyrrol-Formoterol (BREZTRI AEROSPHERE) 160-9-4.8 MCG/ACT AERO Inhale 2 puffs into the lungs 2 (two) times daily. 10.7 g 5   carvedilol (COREG) 25 MG tablet TAKE 1 TABLET BY MOUTH 2 TIMES DAILY WITH MEALS 60 tablet 5   empagliflozin (JARDIANCE) 10 MG TABS tablet Take 1 tablet (10 mg total) by mouth daily. 30 tablet 11   famotidine (PEPCID) 40 MG tablet Take 40 mg by mouth at bedtime.     furosemide (LASIX) 40 MG tablet 1 tab by mouth with morning meal 30 tablet 11   hydrOXYzine (ATARAX) 50 MG tablet Take 50 mg by mouth in the morning and at bedtime.     isosorbide mononitrate (IMDUR) 60 MG 24 hr tablet Take 1 tablet (60 mg total) by mouth daily. 30 tablet 5   meclizine (ANTIVERT) 25 MG tablet Take 25 mg by mouth 3 (three) times daily as needed for dizziness.     omeprazole (PRILOSEC) 40 MG capsule Take 40 mg by mouth daily.     potassium chloride SA (KLOR-CON M) 20  MEQ tablet Take 1 tablet (20 mEq total) by mouth daily. 30 tablet 5   sacubitril-valsartan (ENTRESTO) 49-51 MG Take 1 tablet by mouth 2 (two) times daily. 60 tablet 11   Semaglutide-Weight Management (WEGOVY) 0.25 MG/0.5ML SOAJ Inject 0.25 mg into the skin once a week. 2 mL 0   albuterol (VENTOLIN HFA) 108 (90 Base) MCG/ACT inhaler 2 puffs every 6 hours as needed for wheezing 18 g 1   cetirizine (ZYRTEC) 10 MG tablet Take 1 tablet (10 mg total) by mouth daily. 30 tablet 5   ipratropium (ATROVENT)  0.06 % nasal spray Use 1-2 sprays per nostril 3-4 times daily as needed 15 mL 5   sucralfate (CARAFATE) 1 g tablet Take 1 tablet (1 g total) by mouth 2 (two) times daily for 14 days. (Patient taking differently: Take 1 g by mouth at bedtime.) 28 tablet 0   No current facility-administered medications for this visit.     Known medication allergies: Allergies  Allergen Reactions   Gabapentin Anaphylaxis   Other     "States a narcotic made him feel like he wouldn't wake up"     Physical examination: Blood pressure 120/88, pulse 73, temperature 97.9 F (36.6 C), resp. rate 19, height 5\' 5"  (1.651 m), weight 246 lb 4.8 oz (111.7 kg), SpO2 96%.  General: Alert, interactive, in no acute distress. HEENT: PERRLA, TMs pearly gray, turbinates minimally edematous without discharge, post-pharynx non erythematous. Neck: Supple without lymphadenopathy. Lungs: Clear to auscultation without wheezing, rhonchi or rales. {no increased work of breathing. CV: Normal S1, S2 without murmurs. Abdomen: Nondistended, nontender. Skin: Warm and dry, without lesions or rashes. Extremities:  No clubbing, cyanosis or edema. Neuro:   Grossly intact.  Diagnositics/Labs: None today  Assessment and plan: Allergic rhinitis   - Continue avoidance for dust mites and cockroach. - Continue  taking:  Zyrtec (cetirizine) 10mg  tablet once daily.   Atrovent (ipratropium) 0.06% 1-2 spray per nostril 3-4 times daily as needed for nasal drainage or congestion - You can use an extra dose of the antihistamine, if needed, for breakthrough symptoms.  - Continue to perform nasal saline rinses  Mild persistent asthma - Daily controller medication:  Breztri 2 puffs twice a day (yellow inhaler).  [name] is a step-up inhaler from Symbicort so should improve your asthma symptom control.   - Rescue medications: albuterol 2 puffs every 4-6 hours as needed   - Asthma control goals:  * Full participation in all desired activities  (may need albuterol before activity) * Albuterol use two time or less a week on average (not counting use with activity) * Cough interfering with sleep two time or less a month * Oral steroids no more than once a year * No hospitalizations  OSA - Continue use of CPAP as directed by your pulmonologist or sleep medicine doctor for your CPAP  Vertigo - Will place an ENT referral for you to help with your control of vertigo.  We will call you regarding this referral to schedule appointment - Continue as needed meclizine  Follow-up in 3-4 months or sooner if needed  I appreciate the opportunity to take part in Herndon's care. Please do not hesitate to contact me with questions.  Sincerely,   Margo Aye, MD Allergy/Immunology [facility]

## 2023-09-20 ENCOUNTER — Telehealth: Payer: Self-pay

## 2023-09-20 ENCOUNTER — Other Ambulatory Visit (HOSPITAL_COMMUNITY): Payer: Self-pay

## 2023-09-20 NOTE — Telephone Encounter (Signed)
Pt informed and going to contact insurance

## 2023-09-20 NOTE — Telephone Encounter (Signed)
Pharmacy Patient Advocate Encounter   Received notification from CoverMyMeds that prior authorization for Breztri Aerosphere 160-9-4.8MCG/ACT aerosol is required/requested.   Insurance verification completed.   The patient is insured through [facility] .   Per test claim: Patient not eligible due to non payment of premium. Patient to contact plan.

## 2023-09-21 NOTE — Telephone Encounter (Signed)
LMOM to determine if staying on 0.25 or increasing to 0.5 mg

## 2023-10-05 ENCOUNTER — Telehealth: Payer: Self-pay | Admitting: Allergy

## 2023-10-05 NOTE — Telephone Encounter (Signed)
 John Price was referred to ENT.  I placed an internal referral to [facility].  Once the referral drops into their WQ, they will reach out to the patient to schedule.  Patient has been made aware via MyChart message.

## 2023-10-08 ENCOUNTER — Telehealth: Payer: Self-pay | Admitting: Pharmacy Technician

## 2023-10-08 ENCOUNTER — Telehealth: Payer: Self-pay | Admitting: Cardiology

## 2023-10-08 ENCOUNTER — Other Ambulatory Visit: Payer: Self-pay | Admitting: Cardiology

## 2023-10-08 ENCOUNTER — Other Ambulatory Visit (HOSPITAL_COMMUNITY): Payer: Self-pay

## 2023-10-08 DIAGNOSIS — I69359 Hemiplegia and hemiparesis following cerebral infarction affecting unspecified side: Secondary | ICD-10-CM

## 2023-10-08 DIAGNOSIS — Z951 Presence of aortocoronary bypass graft: Secondary | ICD-10-CM

## 2023-10-08 LAB — LAB REPORT - SCANNED
Creatinine, POC: 599.7 mg/dL
EGFR: 39

## 2023-10-08 NOTE — Telephone Encounter (Signed)
 Pt c/o medication issue:  1. Name of Medication:Semaglutide-Weight Management (WEGOVY) 0.25 MG/0.5ML SOAJ empagliflozin (JARDIANCE) 10 MG TABS tablet  2. How are you currently taking this medication (dosage and times per day)? As written  3. Are you having a reaction (difficulty breathing--STAT)? no  4. What is your medication issue? Pt is calling about needing prior auth for the two meds

## 2023-10-08 NOTE — Telephone Encounter (Signed)
 PER PHARMACY THIS NEEDS A REFILL. I SEE AN ENCOUNTER FROM TODAY SENT TO REFILLS FOR THIS    John Lerner, DO Cardiology Hx of CABG +1 more Dx Medication Refill Reason for conversation   All Conversations: Medication Refill (Newest Message First)October 08, 2023   SI   10/08/23  2:20 PM Interface, Surescripts Out routed this conversation to [employer] Refills This encounter is not signed. The conversation may still be ongoing. Additional Documentation  Encounter Info: Billing Info,   History,   Allergies,   Detailed Report   Orders Placed  None Pending Medication Requests   Semaglutide-Weight Management 0.25 MG/0.5ML INJECT 0.25MG  INTO THE SKIN ONCE A WEEK

## 2023-10-08 NOTE — Telephone Encounter (Signed)
 Pharm D notes there is a refill request but dies not appear the pt has filled it at all... not sure if needing new dose or new start.Marland KitchenMarland KitchenMarland KitchenWill send to Dr Odis Hollingshead nurse for review.

## 2023-10-08 NOTE — Telephone Encounter (Signed)
 Call to see when did he take his last dose of Wegovy, as fill hx does not show that [facility] was filled. Call pt to collect more information so that we can send appropriate dose refill for [facility]. N/A LVM to call us back.

## 2023-10-08 NOTE — Telephone Encounter (Signed)
 Ran test claim for WEGOVY. For a 28 day supply and the co-pay is 4.00 . PA is not needed at this time. Nothing saying this is a transition fill.  PT [employer] INS IS TERMED. RUNNING ON [employer]  I CALLED [facility] FOR THEM TO RUN IT   This test claim was processed through [facility]- copay amounts may vary at other pharmacies due to pharmacy/plan contracts, or as the patient moves through the different stages of their insurance plan.

## 2023-10-08 NOTE — Telephone Encounter (Signed)
 Pharmacy Patient Advocate Encounter   Received notification from Fax that prior authorization for JARDIANCE is required/requested.   Insurance verification completed.   The patient is insured through [facility] .   Per test claim: PA required; PA submitted to above mentioned insurance via CoverMyMeds Key/confirmation #/EOC [facility]) Status is pending

## 2023-10-08 NOTE — Telephone Encounter (Signed)
  The wegovy does not need a prior auth but needs a refill. I see an encounter for refill already  PA request has been Submitted for jardiance. New Encounter has been or will be created for follow up. For additional info see Pharmacy Prior Auth telephone encounter from 10/08/23.    Will send to Pharm D.

## 2023-10-09 NOTE — Telephone Encounter (Signed)
 Pharmacy Patient Advocate Encounter  Received notification from [facility] that Prior Authorization for jardiance has been APPROVED from 10/09/23 to 10/08/24. Spoke to pharmacy to process.Copay is $4.00.    PA #/Case ID/Reference #: [id number]

## 2023-10-10 ENCOUNTER — Encounter: Payer: Self-pay | Admitting: Nephrology

## 2023-10-23 ENCOUNTER — Encounter (INDEPENDENT_AMBULATORY_CARE_PROVIDER_SITE_OTHER): Payer: Self-pay

## 2023-11-05 NOTE — Progress Notes (Signed)
 Cardiology Office Note:  .   Date:  11/13/2023  ID:  John Price, DOB 1970/09/02, MRN [id number] PCP: Kahoano, Haku K, MD  [facility] Providers Cardiologist:  Olinda Bertrand, DO    History of Present Illness: .   John Price is a 53 y.o. male with history of hemorrhagic stroke (2018) with residual/minimal left-sided weakness, history of bioprosthetic mitral valve replacement (2019), chronic HFpEF, hypertension, hyperlipidemia, history of temporary hemodialysis now chronic kidney disease stage IIIb, former smoker, obesity due to excess calories.   He also has a history of hemorrhagic stroke in 2018 which resulted into left-sided weakness. It was felt that the stroke was secondary to uncontrolled hypertension. In 2019 he underwent mitral valve replacement and since then has been on medical therapy for HFpEF.  Patient comes in for routine f/u. Patient is wondering what is going on with his wegovy. He gets his meds at [facility] now instead of [facility]. Denies chest pain, dyspnea, palpitations, edema. Has trouble with migraine headaches. He thinks he overdoes it sometimes. Has anxiety and vertigo. Walks daily.   ROS:    Studies Reviewed: Aaron Aas         Prior CV Studies:    Echocardiogram: 11/2019 [facility]: LVEF 35%, moderate/severe concentric hypertrophy, moderately reduced global left ventricular systolic function, bioprosthetic mitral valve well-seated, normal position.  Mild TR.  Mild LAE.   03/11/2021: LVEF 45-50%, global hypokinesis, moderate LVH, unable to comment on diastolic function due to mitral valve replacement, RV systolic function reduced (RV S' 7cm/sec and TAPSE 1.47cm), RV size mildly enlarged, normal PASP, mild LAE, bioprosthetic mitral valve in situ, well-seated, normal structure and function.  RAP 8 mmHg.     06/06/2022:  Normal LV systolic function with visual EF 50-55%. Left ventricle cavity is normal in size. Severe concentric hypertrophy of the  left ventricle. Normal global wall motion. Indeterminate diastolic filling pattern, elevated LAP.  Bioprosthetic mitral valve. No mitral valve regurgitation. E-wave dominant mitral inflow. Structurally normal tricuspid valve with trace regurgitation. No evidence of pulmonary hypertension. No prior available for comparison.    Lexiscan  stress test 08/22/2022: 1 Day Rest/Stress protocol.  Stress EKG is non-diagnostic, as this is pharmacological stress test using Lexiscan. Small size, mild intensity, perfusion defect at the apical lateral segment, suggestive of ischemia at distal RCA/LCX distribution.  Overall accuracy of the study is limited due to decreased trace uptake in the inferior and inferolateral segments more pronounced at rest as this is likely due to soft tissue attenuation artifact given his BMI of 41 and images performed in sitting position. Ischemia in this region cannot be excluded. No evidence of prior infarct.  Left ventricular size is dilated, LVEF 35%, mild global hypokinesis.  No prior studies for comparison  Clinical correlation is required.  Intermediate risk.   Cardiac monitor (Zio Patch): 04/20/2023 -04/24/2023 Dominant rhythm sinus, followed by bradycardia (16%). Heart rate 39-108 bpm.  Avg HR 67 bpm. No atrial fibrillation detected during the monitoring period. No supraventricular tachycardia, ventricular tachycardia, high grade AV block, pauses (3 seconds or longer). Total supraventricular ectopic burden 5.1%. Total ventricular ectopic burden <1%. Patient triggered events: 21.  Underlying rhythm predominantly sinus with PACs/PVCs and baseline artifact.    Risk Assessment/Calculations:             Physical Exam:   VS:  BP 112/84   Pulse 63   Ht 5\' 8"  (1.727 m)   Wt 239 lb 9.6 oz (108.7 kg)   SpO2  97%   BMI 36.43 kg/m    Wt Readings from Last 3 Encounters:  11/13/23 239 lb 9.6 oz (108.7 kg)  09/19/23 246 lb 4.8 oz (111.7 kg)  05/21/23 246 lb (111.6 kg)     GEN: Obesity, in no acute distress NECK: No JVD; No carotid bruits CARDIAC:  RRR, no murmurs, rubs, gallops RESPIRATORY:  Clear to auscultation without rales, wheezing or rhonchi  ABDOMEN: Soft, non-tender, non-distended EXTREMITIES:  No edema; No deformity   ASSESSMENT AND PLAN: .    Chronic heart failure with preserved ejection fraction (HFpEF) [facility]) May 2021: LVEF 35%, August 2022: LVEF 45-50%, November 2023: LVEF 50-55% Clinically euvolemic. Blood pressures controlled today  Premature atrial contraction Patient has Zio patch   Average heart rate of 67 bpm with a tachycardia burden of 16%.   Patient triggered events noted predominantly sinus rhythm with PACs/PVCs. PSVT burden approximately 5.1%. Already on carvedilol. No recent symptoms    History of mitral valve replacement with bioprosthetic valve Recent echo November 2023. Continue aspirin. He is aware of the importance of endocarditis prophylaxis prior to dental procedures-hasn't been to a dentist in a long time.   Benign hypertension with chronic kidney disease, stage III (HCC) Office blood pressures are acceptable not at goal. Will hold off on up titration of medications for the reasons mentioned above. He did have an appointment with his nephrologist since last office visit.  He was recommended to take Lasix on MWF but he chooses be on a daily   Mixed hyperlipidemia Recent lipids independently reviewed. Continue atorvastatin 40 mg p.o. daily.  LDL 79 04/2023     Obesity-wegovy approved and started but never got a refill. I spoke with pharmacy and they will refill it.          Dispo: f/u Dr.Tolia in 6 months  Signed, Theotis Flake, PA-C

## 2023-11-13 ENCOUNTER — Encounter: Payer: Self-pay | Admitting: Physician Assistant

## 2023-11-13 ENCOUNTER — Ambulatory Visit: Payer: MEDICAID | Attending: Physician Assistant | Admitting: Physician Assistant

## 2023-11-13 ENCOUNTER — Other Ambulatory Visit: Payer: Self-pay | Admitting: Pharmacist

## 2023-11-13 VITALS — BP 112/84 | HR 63 | Ht 68.0 in | Wt 239.6 lb

## 2023-11-13 DIAGNOSIS — I69359 Hemiplegia and hemiparesis following cerebral infarction affecting unspecified side: Secondary | ICD-10-CM

## 2023-11-13 DIAGNOSIS — Z953 Presence of xenogenic heart valve: Secondary | ICD-10-CM

## 2023-11-13 DIAGNOSIS — I491 Atrial premature depolarization: Secondary | ICD-10-CM

## 2023-11-13 DIAGNOSIS — I10 Essential (primary) hypertension: Secondary | ICD-10-CM | POA: Diagnosis not present

## 2023-11-13 DIAGNOSIS — I5032 Chronic diastolic (congestive) heart failure: Secondary | ICD-10-CM

## 2023-11-13 DIAGNOSIS — E782 Mixed hyperlipidemia: Secondary | ICD-10-CM

## 2023-11-13 DIAGNOSIS — Z951 Presence of aortocoronary bypass graft: Secondary | ICD-10-CM

## 2023-11-13 DIAGNOSIS — E66812 Obesity, class 2: Secondary | ICD-10-CM

## 2023-11-13 DIAGNOSIS — Z6838 Body mass index (BMI) 38.0-38.9, adult: Secondary | ICD-10-CM

## 2023-11-13 MED ORDER — WEGOVY 0.25 MG/0.5ML ~~LOC~~ SOAJ: 0.2500 mg | SUBCUTANEOUS | 0 refills | Status: DC

## 2023-11-13 NOTE — Patient Instructions (Signed)
 Medication Instructions:  Your physician recommends that you continue on your current medications as directed. Please refer to the Current Medication list given to you today.  *If you need a refill on your cardiac medications before your next appointment, please call your pharmacy*  Lab Work: NONE If you have labs (blood work) drawn today and your tests are completely normal, you will receive your results only by: MyChart Message (if you have MyChart) OR A paper copy in the mail If you have any lab test that is abnormal or we need to change your treatment, we will call you to review the results.  Testing/Procedures: NONE  Follow-Up: At [facility], you and your health needs are our priority.  As part of our continuing mission to provide you with exceptional heart care, our providers are all part of one team.  This team includes your primary Cardiologist (physician) and Advanced Practice Providers or APPs (Physician Assistants and Nurse Practitioners) who all work together to provide you with the care you need, when you need it.  Your next appointment:   6 month(s)  Provider:   Olinda Bertrand, DO   We recommend signing up for the patient portal called "MyChart".  Sign up information is provided on this After Visit Summary.  MyChart is used to connect with patients for Virtual Visits (Telemedicine).  Patients are able to view lab/test results, encounter notes, upcoming appointments, etc.  Non-urgent messages can be sent to your provider as well.   To learn more about what you can do with MyChart, go to [email or web address].   Other Instructions       1st Floor: - Lobby - Registration  - Pharmacy  - Lab - Cafe  2nd Floor: - PV Lab - Diagnostic Testing (echo, CT, nuclear med)  3rd Floor: - Vacant  4th Floor: - TCTS (cardiothoracic surgery) - AFib Clinic - Structural Heart Clinic - Vascular Surgery  - Vascular Ultrasound  5th Floor: - HeartCare Cardiology  (general and EP) - Clinical Pharmacy for coumadin, hypertension, lipid, weight-loss medications, and med management appointments    Valet parking services will be available as well.

## 2023-11-29 ENCOUNTER — Ambulatory Visit: Payer: MEDICAID | Admitting: Allergy

## 2023-12-19 ENCOUNTER — Ambulatory Visit (INDEPENDENT_AMBULATORY_CARE_PROVIDER_SITE_OTHER): Payer: MEDICAID | Admitting: Otolaryngology

## 2023-12-19 ENCOUNTER — Encounter (INDEPENDENT_AMBULATORY_CARE_PROVIDER_SITE_OTHER): Payer: Self-pay | Admitting: Otolaryngology

## 2023-12-19 VITALS — BP 173/110 | HR 72 | Ht 68.0 in

## 2023-12-19 DIAGNOSIS — R42 Dizziness and giddiness: Secondary | ICD-10-CM

## 2023-12-19 NOTE — Progress Notes (Signed)
 CC: Recurrent dizziness  HPI:  John Price is a 53 y.o. male who presents today complaining of recurrent dizziness for 6 to 7 years.  According to the patient, his dizziness started after his mitral valve surgery.  He describes his dizziness as a spinning vertigo that lasts from minutes to hours.  It is often accompanied by nausea.  He also has a history of anxiety.  He denies any otalgia, otorrhea, tinnitus, or recent change in his hearing.  He has no previous ENT surgery.  He head CT scan in May 2024 was negative.  He has multiple risk factors for dizziness, including hypertension, coronary artery disease, congestive heart failure, and previous stroke.  Past Medical History:  Diagnosis Date   Asthma    CHF (congestive heart failure) (HCC)    Chronic kidney disease    Coronary artery disease    Hyperlipidemia    Hypertension    Primary hypertension 12/28/2020   Stroke [facility])     Past Surgical History:  Procedure Laterality Date   ANKLE ARTHODESIS W/ ARTHROSCOPY Left    APPENDECTOMY     CARDIAC VALVE REPLACEMENT     CORONARY ARTERY BYPASS GRAFT  2019    Family History  Problem Relation Age of Onset   Allergic rhinitis Mother    Hypertension Mother    Kidney disease Mother    Epilepsy Sister    Early death Sister    Seizures Sister    Hypertension Brother    Hypertension Brother    Lupus Paternal Aunt    COPD Paternal Uncle    Allergic rhinitis Cousin    Asthma Cousin    Eczema Neg Hx    Angioedema Neg Hx     Social History:  reports that he quit smoking about 6 years ago. His smoking use included cigarettes. He started smoking about 18 years ago. He has a 3 pack-year smoking history. He has never been exposed to tobacco smoke. He has never used smokeless tobacco. He reports current alcohol use. He reports that he does not use drugs.  Allergies:  Allergies  Allergen Reactions   Gabapentin Anaphylaxis   Other     "States a narcotic made him feel like he wouldn't  wake up"    Prior to Admission medications   Medication Sig Start Date End Date Taking? Authorizing Provider  albuterol  (VENTOLIN  HFA) 108 (90 Base) MCG/ACT inhaler 2 puffs every 6 hours as needed for wheezing 09/19/23  Yes Padgett, Rhoderick Ceo, MD  allopurinol  (ZYLOPRIM ) 100 MG tablet Take 100 mg by mouth daily. Patient did not know the dose   Yes [provider]  aspirin  81 MG chewable tablet 1 tab by mouth daily with morning meal 07/10/23  Yes Tolia, Sunit, DO  atorvastatin  (LIPITOR) 40 MG tablet 1 tab by mouth with evening meal. 07/10/23  Yes Tolia, Sunit, DO  Budeson-Glycopyrrol-Formoterol  (BREZTRI  AEROSPHERE) 160-9-4.8 MCG/ACT AERO Inhale 2 puffs into the lungs 2 (two) times daily. 09/19/23  Yes Padgett, Rhoderick Ceo, MD  carvedilol  (COREG ) 25 MG tablet TAKE 1 TABLET BY MOUTH 2 TIMES DAILY WITH MEALS 07/10/23  Yes Tolia, Sunit, DO  cetirizine  (ZYRTEC ) 10 MG tablet Take 1 tablet (10 mg total) by mouth daily. 09/19/23  Yes Padgett, Rhoderick Ceo, MD  empagliflozin  (JARDIANCE ) 10 MG TABS tablet Take 1 tablet (10 mg total) by mouth daily. 07/10/23  Yes Tolia, Sunit, DO  famotidine  (PEPCID ) 40 MG tablet Take 40 mg by mouth at bedtime. 12/06/21  Yes [provider]  furosemide  (LASIX ) 40 MG tablet 1 tab by mouth with morning meal 07/10/23  Yes Tolia, Sunit, DO  hydrOXYzine (ATARAX) 50 MG tablet Take 50 mg by mouth in the morning and at bedtime.   Yes [provider]  ipratropium (ATROVENT ) 0.06 % nasal spray Use 1-2 sprays per nostril 3-4 times daily as needed 09/19/23  Yes Padgett, Rhoderick Ceo, MD  isosorbide  mononitrate (IMDUR ) 60 MG 24 hr tablet Take 1 tablet (60 mg total) by mouth daily. 07/10/23  Yes Tolia, Sunit, DO  meclizine  (ANTIVERT ) 25 MG tablet Take 25 mg by mouth 3 (three) times daily as needed for dizziness.   Yes [provider]  omeprazole (PRILOSEC) 40 MG capsule Take 40 mg by mouth daily.   Yes [provider]  potassium  chloride SA (KLOR-CON  M) 20 MEQ tablet Take 1 tablet (20 mEq total) by mouth daily. 07/10/23  Yes Tolia, Sunit, DO  sacubitril -valsartan  (ENTRESTO ) 49-51 MG Take 1 tablet by mouth 2 (two) times daily. 07/10/23  Yes Tolia, Sunit, DO  Semaglutide -Weight Management (WEGOVY ) 0.25 MG/0.5ML SOAJ Inject 0.25 mg into the skin once a week. 11/13/23  Yes Tolia, Sunit, DO  sucralfate  (CARAFATE ) 1 g tablet Take 1 tablet (1 g total) by mouth 2 (two) times daily for 14 days. Patient taking differently: Take 1 g by mouth at bedtime. 02/02/23 05/14/23  Curatolo, Adam, DO    Blood pressure (!) 173/110, pulse 72, height 5\' 8"  (1.727 m), SpO2 92%. Exam: General: Communicates without difficulty, well nourished, no acute distress. Head: Normocephalic, no evidence injury, no tenderness, facial buttresses intact without stepoff. Face/sinus: No tenderness to palpation and percussion. Facial movement is normal and symmetric. Eyes: PERRL, EOMI. No scleral icterus, conjunctivae clear. Neuro: CN II exam reveals vision grossly intact.  No nystagmus at any point of gaze. Ears: Auricles well formed without lesions.  Ear canals are intact without mass or lesion.  No erythema or edema is appreciated.  The TMs are intact without fluid. Nose: External evaluation reveals normal support and skin without lesions.  Dorsum is intact.  Anterior rhinoscopy reveals congested mucosa over anterior aspect of inferior turbinates and intact septum.  No purulence noted. Oral:  Oral cavity and oropharynx are intact, symmetric, without erythema or edema.  Mucosa is moist without lesions. Neck: Full range of motion without pain.  There is no significant lymphadenopathy.  No masses palpable.  Thyroid  bed within normal limits to palpation.  Parotid glands and submandibular glands equal bilaterally without mass.  Trachea is midline. Neuro:  CN 2-12 grossly intact. Vestibular: No nystagmus at any point of gaze. Dix Hallpike negative. Vestibular: There is no  nystagmus with pneumatic pressure on either tympanic membrane or Valsalva. The cerebellar examination is unremarkable.  Gait wide-based.  Assessment: 1.  Recurrent dizziness of unknown etiology.  Based on his history, the cause of his his dizziness may be multifactorial.  Other possible differential diagnoses include transient BPPV, vestibular migraine, Meniere's disease, peripheral vestibular dysfunction, or other central/systemic causes.  1.  His ear canals, tympanic membranes, and middle ear spaces are normal.  His Dix-Hallpike maneuver is negative.  Plan: 1.  The physical exam findings are reviewed with the patient. 2.  The pathophysiology of vestibular dysfunction and dizziness are discussed extensively with the patient. The possible differential diagnoses are reviewed. Questions are invited and answered.   3.  The patient will likely benefit from undergoing physical therapy/vestibular rehabilitation to improve the balancing function. A referral will be arranged as soon as possible.  4.  If the patient continues to be symptomatic, he may benefit from vestibular neurodiagnostic testing at a tertiary care center to evaluate for possible vestibular dysfunction.   Stevee Valenta W Reine Bristow 12/19/2023, 2:26 PM

## 2023-12-20 DIAGNOSIS — R42 Dizziness and giddiness: Secondary | ICD-10-CM | POA: Insufficient documentation

## 2024-01-02 ENCOUNTER — Ambulatory Visit: Payer: MEDICAID | Attending: Otolaryngology

## 2024-01-02 DIAGNOSIS — R2681 Unsteadiness on feet: Secondary | ICD-10-CM | POA: Diagnosis present

## 2024-01-02 DIAGNOSIS — R42 Dizziness and giddiness: Secondary | ICD-10-CM | POA: Insufficient documentation

## 2024-01-02 NOTE — Therapy (Unsigned)
 OUTPATIENT PHYSICAL THERAPY VESTIBULAR EVALUATION     Patient Name: John Price MRN: [id number] DOB:[birth year], 53 y.o., male Today's Date: 01/03/2024  END OF SESSION:  PT End of Session - 01/02/24 1305     Visit Number 1    Number of Visits 7    Date for PT Re-Evaluation 02/14/24    Authorization Type [employer]    PT Start Time 1315    PT Stop Time 1400    PT Time Calculation (min) 45 min             Past Medical History:  Diagnosis Date   Asthma    CHF (congestive heart failure) (HCC)    Chronic kidney disease    Coronary artery disease    Hyperlipidemia    Hypertension    Primary hypertension 12/28/2020   Stroke [facility])    Past Surgical History:  Procedure Laterality Date   ANKLE ARTHODESIS W/ ARTHROSCOPY Left    APPENDECTOMY     CARDIAC VALVE REPLACEMENT     CORONARY ARTERY BYPASS GRAFT  2019   Patient Active Problem List   Diagnosis Date Noted   Dizziness 12/20/2023   Obesity 05/22/2023   Chronic kidney disease 11/30/2021   Cardiomyopathy (HCC) 11/30/2021   Gastroesophageal reflux disease 11/30/2021   Chronic combined systolic and diastolic heart failure (HCC) 11/30/2021   Hyperglycemia 05/02/2021   History of mitral valve replacement with bioprosthetic valve    Hx of CABG    History of heart failure 12/28/2020   Primary hypertension 12/28/2020   Hyperlipidemia 12/28/2020    PCP: Kahoano, Haku K, MD REFERRING PROVIDER:  R42 (ICD-10-CM) - Dizziness    REFERRING DIAG: Reynold Caves, MD  THERAPY DIAG:  Dizziness and giddiness  Unsteadiness on feet  ONSET DATE: 7 years  Rationale for Evaluation and Treatment: Rehabilitation  SUBJECTIVE:   SUBJECTIVE STATEMENT: Has been having issues of dizziness and anxiety x 7 years when he had heart surgery (valve replacement).  Pt reports during the surgery he also suffered a CVA (indicates blood vessel in back of head burst)--imaging reports hx of CVA.  Notes his LUE and left vision was  affected by this. Pt reports experiencing sharp/shooting HA pains in the top of head and notes these often accompany dizzy episodes and experiences photosensitivity with events.  Reports he takes Meclazine twice a day but unsure how effective it is.  Does recount episodes of experiencing vertigo when asleep Pt accompanied by: self  PERTINENT HISTORY:  From Dr. Pearson Bounds note:  Recurrent dizziness for 6 to 7 years.  According to the patient, his dizziness started after his mitral valve surgery.  He describes his dizziness as a spinning vertigo that lasts from minutes to hours.  It is often accompanied by nausea.  He also has a history of anxiety.  He denies any otalgia, otorrhea, tinnitus, or recent change in his hearing.  He has no previous ENT surgery.  He head CT scan in May 2024 was negative.  He has multiple risk factors for dizziness, including hypertension, coronary artery disease, congestive heart failure, and previous stroke.  PAIN:  Are you having pain? No  PRECAUTIONS: Fall  RED FLAGS: None   WEIGHT BEARING RESTRICTIONS: No  FALLS: Has patient fallen in last 6 months? No  LIVING ENVIRONMENT: Lives with: lives alone Lives in: House/apartment Stairs: No Has following equipment at home: Single point cane  PLOF: Independent with household mobility with device, Independent with community mobility with device, and uses transportation  services for community  PATIENT GOALS:   OBJECTIVE:  Note: Objective measures were completed at Evaluation unless otherwise noted.  DIAGNOSTIC FINDINGS: I  MPRESSION: No acute intracranial findings are seen. 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 Atrophy. Small vessel disease. (11/2022)  COGNITION: Overall cognitive status: Within functional limits for tasks assessed   SENSATION: Not tested     DTRs:  NT  POSTURE:  forward head  Cervical ROM:    Active A/PROM (deg) eval  Flexion WFL  Extension Guarded as it  provokes symptoms  Right lateral flexion WNL  Left lateral flexion symptomatic  Right rotation WNL  Left rotation WNL  (Blank rows = not tested)   STRENGTH: RLE 5/5, LLE 4/5    BED MOBILITY:  NT reports independent  TRANSFERS: Assistive device utilized: Single point cane  Sit to stand: Modified independence Stand to sit: Modified independence Chair to chair: Modified independence Floor: NT    CURB: SBA  GAIT: Gait pattern: slow and step to pattern Distance walked:  Assistive device utilized: Single point cane Level of assistance: SBA Comments:   FUNCTIONAL TESTS:  5 times sit to stand: unable-completed one rep and experiences LLE jerking in standing Dynamic Gait Index: TBD  M-CTSIB  Condition 1: Firm Surface, EO 30 Sec, minimal sway but does this odd jerking motion of legs Sway  Condition 2: Firm Surface, EC 3 Sec, increased jerking movements and anterior LOB Sway  Condition 3: Foam Surface, EO  Sec,  Sway  Condition 4: Foam Surface, EC  Sec,  Sway    PATIENT SURVEYS:  DHI 56  VESTIBULAR ASSESSMENT:  GENERAL OBSERVATION: reports significant left visual field cut since onset of CVA   SYMPTOM BEHAVIOR:  Subjective history: 6-7 year hx of dizziness since open heart valve replacement and CVA  Non-Vestibular symptoms: headaches and migraine symptoms  Type of dizziness: Spinning/Vertigo and Unsteady with head/body turns  Frequency: daily  Duration: minutes to hours  Aggravating factors: Induced by position change: lying supine, rolling to the right, rolling to the left, supine to sit, and sit to stand, Induced by motion: occur when walking, looking up at the ceiling, bending down to the ground, turning body quickly, and turning head quickly, and Worse in the dark  Relieving factors: no known relieving factors  Progression of symptoms: unchanged  OCULOMOTOR EXAM:  Ocular Alignment: normal  Ocular ROM: No Limitations--seems to have excessive left lateral gaze and  vertical ROM  Spontaneous Nystagmus: absent  Gaze-Induced Nystagmus: absent  Smooth Pursuits: intact  Saccades: hypometric/undershoots  Convergence/Divergence: 3 cm     VESTIBULAR - OCULAR REFLEX:   Slow VOR: Comment: horizontal--symptomatic/nauseated, vertical--symptomatic/nauseated--difficulty coordinating both movements  VOR Cancellation: Normal  Head-Impulse Test: Normal  Dynamic Visual Acuity: NT   POSITIONAL TESTING: NT due to time constraints  MOTION SENSITIVITY:  Motion Sensitivity Quotient Intensity: 0 = none, 1 = Lightheaded, 2 = Mild, 3 = Moderate, 4 = Severe, 5 = Vomiting  Intensity  1. Sitting to supine   2. Supine to L side   3. Supine to R side   4. Supine to sitting   5. L Hallpike-Dix   6. Up from L    7. R Hallpike-Dix   8. Up from R    9. Sitting, head tipped to L knee   10. Head up from L knee   11. Sitting, head tipped to R knee   12. Head up from R knee   13. Sitting head turns x5  14.Sitting head nods x5   15. In stance, 180 turn to L    16. In stance, 180 turn to R     OTHOSTATICS: not done                                                                                                                               TREATMENT DATE: 01/02/24   Initiated HEP below  PATIENT EDUCATION: Education details: assessment details, need for further assessment in sessions, HEP initiation Person educated: Patient Education method: Explanation Education comprehension: verbalized understanding  HOME EXERCISE PROGRAM: Access Code: [email or web address] URL: [email or web address] Date: 01/02/2024 Prepared by: Tedd Favorite  Exercises - Seated Gaze Stabilization with Head Rotation  - 1 x daily - 7 x weekly - 3-5 sets - 30 sec rounds hold - Seated Gaze Stabilization with Head Nod  - 1 x daily - 7 x weekly - 3-5 sets - 30 sec rounds hold  GOALS: Goals reviewed with patient? Yes  SHORT TERM GOALS: Target date: same as LTG    LONG TERM GOALS:  Target date: 02/14/2024    The patient will be independent with HEP for gaze adaptation, habituation, balance, and general mobility. Baseline:  Goal status: INITIAL  2.  Report improved symptoms for improved quality of life per score 38 DHI Baseline: 56 Goal status: INITIAL  3.  Demo improved balance and BLE strength completing 5xSTS test in 20 sec Baseline: one rep Goal status: INITIAL  4.  Demo improved safety with ambulation and reduce risk for falls per score 17/24 Dynamic Gait Index Baseline: TBD Goal status: INITIAL  5.  Improve postural stability for safety with ADL per mild-moderate sway x 30 sec condition 2 M-CTSIB Baseline: unable Goal status: INITIAL    ASSESSMENT:  CLINICAL IMPRESSION: Patient is a 53 y.o. male who was seen today for physical therapy evaluation and treatment for chronic dizziness.  Symptomatic with head movements left > right and demonstrates sensitivity to VOR movements and reports motion sensitivity to certain movements such as quick turns of head/body, bending over, and occasionally rolling side to side in bed.  Generalized weakness with LLE > RLE and gait deviations and balance deficits evident.  Poor postural stability in unsupported standing and does an odd jerking movement of his legs in standing under various demands. Pt would benefit from PT services to address deficits and limitations to reduce symptoms and improve safety with balance and gait   OBJECTIVE IMPAIRMENTS: Abnormal gait, decreased activity tolerance, decreased balance, difficulty walking, decreased strength, dizziness, and impaired vision/preception.   ACTIVITY LIMITATIONS: carrying, bending, sleeping, transfers, bed mobility, reach over head, and locomotion level  PARTICIPATION LIMITATIONS: meal prep, cleaning, laundry, interpersonal relationship, and community activity  PERSONAL FACTORS: Age, Time since onset of injury/illness/exacerbation, and 3+ comorbidities: PMH are also  affecting patient's functional outcome.   REHAB POTENTIAL: Good  CLINICAL DECISION MAKING: Evolving/moderate complexity  EVALUATION COMPLEXITY: Moderate   PLAN:  PT FREQUENCY: 1x/week  PT DURATION: 6 weeks  PLANNED INTERVENTIONS: 97750- Physical Performance Testing, 97110-Therapeutic exercises, 97530- Therapeutic activity, [id number]- Neuromuscular re-education, 97535- Self Care, [id number]- Manual therapy, [id number]- Gait training, and [phone]- Canalith repositioning  PLAN FOR NEXT SESSION: DGI, positional tests, HEP review   7:49 AM, 01/03/24 M. Kelly Charlene Detter, PT, DPT Physical Therapist- [place] Office Number: [phone]   For all possible CPT codes, reference the Planned Interventions line above.     Check all conditions that are expected to impact treatment: {Conditions expected to impact treatment:Neurological condition and/or seizures   If treatment provided at initial evaluation, no treatment charged due to lack of authorization.

## 2024-01-03 ENCOUNTER — Other Ambulatory Visit: Payer: Self-pay

## 2024-01-10 ENCOUNTER — Ambulatory Visit: Payer: MEDICAID | Admitting: Physical Therapy

## 2024-01-16 ENCOUNTER — Ambulatory Visit: Payer: MEDICAID | Admitting: Physical Therapy

## 2024-01-16 ENCOUNTER — Encounter: Payer: Self-pay | Admitting: Physical Therapy

## 2024-01-16 DIAGNOSIS — R42 Dizziness and giddiness: Secondary | ICD-10-CM

## 2024-01-16 DIAGNOSIS — R2681 Unsteadiness on feet: Secondary | ICD-10-CM

## 2024-01-16 NOTE — Therapy (Signed)
 OUTPATIENT PHYSICAL THERAPY VESTIBULAR TREATMENT     Patient Name: John Price MRN: [id number] DOB:[birth year], 53 y.o., male Today's Date: 01/16/2024  END OF SESSION:  PT End of Session - 01/16/24 1303     Visit Number 2    Number of Visits 7    Date for PT Re-Evaluation 02/14/24    Authorization Type [employer]    Authorization Time Period 01/02/2024-02/14/2024    Authorization - Visit Number 1    Authorization - Number of Visits 7    PT Start Time 1313    PT Stop Time 1402    PT Time Calculation (min) 49 min    Activity Tolerance Patient tolerated treatment well    Behavior During Therapy WFL for tasks assessed/performed           Past Medical History:  Diagnosis Date   Asthma    CHF (congestive heart failure) (HCC)    Chronic kidney disease    Coronary artery disease    Hyperlipidemia    Hypertension    Primary hypertension 12/28/2020   Stroke [facility])    Past Surgical History:  Procedure Laterality Date   ANKLE ARTHODESIS W/ ARTHROSCOPY Left    APPENDECTOMY     CARDIAC VALVE REPLACEMENT     CORONARY ARTERY BYPASS GRAFT  2019   Patient Active Problem List   Diagnosis Date Noted   Dizziness 12/20/2023   Obesity 05/22/2023   Chronic kidney disease 11/30/2021   Cardiomyopathy (HCC) 11/30/2021   Gastroesophageal reflux disease 11/30/2021   Chronic combined systolic and diastolic heart failure (HCC) 11/30/2021   Hyperglycemia 05/02/2021   History of mitral valve replacement with bioprosthetic valve    Hx of CABG    History of heart failure 12/28/2020   Primary hypertension 12/28/2020   Hyperlipidemia 12/28/2020    PCP: Kahoano, Haku K, MD REFERRING PROVIDER:  R42 (ICD-10-CM) - Dizziness    REFERRING DIAG: Reynold Caves, MD  THERAPY DIAG:  Dizziness and giddiness  Unsteadiness on feet  ONSET DATE: 7 years  Rationale for Evaluation and Treatment: Rehabilitation  SUBJECTIVE:   SUBJECTIVE STATEMENT: I hate the dizziness, especially  when it happens when I'm sleeping.  Waiting to get my glasses. Pt accompanied by: self  PERTINENT HISTORY:  From Dr. Pearson Bounds note:  Recurrent dizziness for 6 to 7 years.  According to the patient, his dizziness started after his mitral valve surgery.  He describes his dizziness as a spinning vertigo that lasts from minutes to hours.  It is often accompanied by nausea.  He also has a history of anxiety.  He denies any otalgia, otorrhea, tinnitus, or recent change in his hearing.  He has no previous ENT surgery.  He head CT scan in May 2024 was negative.  He has multiple risk factors for dizziness, including hypertension, coronary artery disease, congestive heart failure, and previous stroke.  PAIN:  Are you having pain? No  PRECAUTIONS: Fall  RED FLAGS: None   WEIGHT BEARING RESTRICTIONS: No  FALLS: Has patient fallen in last 6 months? No  LIVING ENVIRONMENT: Lives with: lives alone Lives in: House/apartment Stairs: No Has following equipment at home: Single point cane  PLOF: Independent with household mobility with device, Independent with community mobility with device, and uses transportation services for community  PATIENT GOALS:   OBJECTIVE:    TODAY'S TREATMENT: 01/16/2024 Activity Comments  DGI 8/24 Pt very unsteady, feels that LLE will give way, so he stops often  Baseline dizziness:  5.5/10  Review HEP -Horizontal gaze stabilization, performed x 2 reps ; then performs 4 reps  Vertical gaze stabilization, performed x 2 reps, then 5 reps   Very small motion, c/o 10/10  Pt has episodes where he rocks back and looks up to ceiling due to intense dizziness.  Cues to keep feet on the ground to stay grounded and to look at visual target  There Ex: Seated LAQ 10 reps Sit to stand 5 reps Cues for control             [facility] PT Assessment - 01/16/24 1342       Standardized Balance Assessment   Standardized Balance Assessment Dynamic Gait Index      Dynamic Gait Index    Level Surface Moderate Impairment   22 sec   Change in Gait Speed Severe Impairment    Gait with Horizontal Head Turns Moderate Impairment   25   Gait with Vertical Head Turns Severe Impairment    Gait and Pivot Turn Mild Impairment    Step Over Obstacle Moderate Impairment    Step Around Obstacles Moderate Impairment    Steps Mild Impairment    Total Score 8    DGI comment: Scores <19/24 indicate increased fall risk          HOME EXERCISE PROGRAM: Access Code: [email or web address] URL: [email or web address] Date: 01/16/2024 Prepared by: [facility] - Outpatient  Rehab - [facility]  Exercises - Seated Gaze Stabilization with Head Rotation  - 1 x daily - 7 x weekly - 3-5 sets - 30 sec rounds hold - Seated Gaze Stabilization with Head Nod  - 1 x daily - 7 x weekly - 3-5 sets - 30 sec rounds hold - Seated Long Arc Quad  - 1 x daily - 7 x weekly - 2 sets - 10 reps  PATIENT EDUCATION: Education details: Discussed rationale for exercises to try to habituate symptoms; also need to strengthen LLE; discussed results of DGI score and ways to continue to address dizziness and unsteadiness Person educated: Patient Education method: Explanation, Verbal cues, Handouts, and brief, LARGE handwritten instructions on picture handouts Education comprehension: verbalized understanding, returned demonstration, verbal cues required, and needs further education  ----------------------------------- Note: Objective measures were completed at Evaluation unless otherwise noted.  DIAGNOSTIC FINDINGS: I  MPRESSION: No acute intracranial findings are seen. 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 Atrophy. Small vessel disease. (11/2022)  COGNITION: Overall cognitive status: Within functional limits for tasks assessed   SENSATION: Not tested     DTRs:  NT  POSTURE:  forward head  Cervical ROM:    Active A/PROM (deg) eval  Flexion WFL  Extension Guarded as it  provokes symptoms  Right lateral flexion WNL  Left lateral flexion symptomatic  Right rotation WNL  Left rotation WNL  (Blank rows = not tested)   STRENGTH: RLE 5/5, LLE 4/5    BED MOBILITY:  NT reports independent  TRANSFERS: Assistive device utilized: Single point cane  Sit to stand: Modified independence Stand to sit: Modified independence Chair to chair: Modified independence Floor: NT    CURB: SBA  GAIT: Gait pattern: slow and step to pattern Distance walked:  Assistive device utilized: Single point cane Level of assistance: SBA Comments:   FUNCTIONAL TESTS:  5 times sit to stand: unable-completed one rep and experiences LLE jerking in standing Dynamic Gait Index: TBD  M-CTSIB  Condition 1: Firm Surface, EO 30 Sec, minimal sway but does this odd jerking motion of  legs Sway  Condition 2: Firm Surface, EC 3 Sec, increased jerking movements and anterior LOB Sway  Condition 3: Foam Surface, EO  Sec,  Sway  Condition 4: Foam Surface, EC  Sec,  Sway    PATIENT SURVEYS:  DHI 56  VESTIBULAR ASSESSMENT:  GENERAL OBSERVATION: reports significant left visual field cut since onset of CVA   SYMPTOM BEHAVIOR:  Subjective history: 6-7 year hx of dizziness since open heart valve replacement and CVA  Non-Vestibular symptoms: headaches and migraine symptoms  Type of dizziness: Spinning/Vertigo and Unsteady with head/body turns  Frequency: daily  Duration: minutes to hours  Aggravating factors: Induced by position change: lying supine, rolling to the right, rolling to the left, supine to sit, and sit to stand, Induced by motion: occur when walking, looking up at the ceiling, bending down to the ground, turning body quickly, and turning head quickly, and Worse in the dark  Relieving factors: no known relieving factors  Progression of symptoms: unchanged  OCULOMOTOR EXAM:  Ocular Alignment: normal  Ocular ROM: No Limitations--seems to have excessive left lateral gaze and  vertical ROM  Spontaneous Nystagmus: absent  Gaze-Induced Nystagmus: absent  Smooth Pursuits: intact  Saccades: hypometric/undershoots  Convergence/Divergence: 3 cm     VESTIBULAR - OCULAR REFLEX:   Slow VOR: Comment: horizontal--symptomatic/nauseated, vertical--symptomatic/nauseated--difficulty coordinating both movements  VOR Cancellation: Normal  Head-Impulse Test: Normal  Dynamic Visual Acuity: NT   POSITIONAL TESTING: NT due to time constraints  MOTION SENSITIVITY:  Motion Sensitivity Quotient Intensity: 0 = none, 1 = Lightheaded, 2 = Mild, 3 = Moderate, 4 = Severe, 5 = Vomiting  Intensity  1. Sitting to supine   2. Supine to L side   3. Supine to R side   4. Supine to sitting   5. L Hallpike-Dix   6. Up from L    7. R Hallpike-Dix   8. Up from R    9. Sitting, head tipped to L knee   10. Head up from L knee   11. Sitting, head tipped to R knee   12. Head up from R knee   13. Sitting head turns x5   14.Sitting head nods x5   15. In stance, 180 turn to L    16. In stance, 180 turn to R     OTHOSTATICS: not done                                                                                                                               TREATMENT DATE: 01/02/24   Initiated HEP below  PATIENT EDUCATION: Education details: assessment details, need for further assessment in sessions, HEP initiation Person educated: Patient Education method: Explanation Education comprehension: verbalized understanding  HOME EXERCISE PROGRAM: Access Code: [email or web address] URL: [email or web address] Date: 01/02/2024 Prepared by: Kelly Halpin  Exercises - Seated Gaze Stabilization with Head Rotation  - 1 x daily - 7 x weekly - 3-5 sets - 30 sec rounds hold -  Seated Gaze Stabilization with Head Nod  - 1 x daily - 7 x weekly - 3-5 sets - 30 sec rounds hold  GOALS: Goals reviewed with patient? Yes  SHORT TERM GOALS: Target date: same as LTG    LONG TERM GOALS:  Target date: 02/14/2024    The patient will be independent with HEP for gaze adaptation, habituation, balance, and general mobility. Baseline:  Goal status: IN PROGRESS  2.  Report improved symptoms for improved quality of life per score 38 DHI Baseline: 56 Goal status: IN PROGRESS  3.  Demo improved balance and BLE strength completing 5xSTS test in 20 sec Baseline: one rep Goal status: IN PROGRESS  4.  Demo improved safety with ambulation and reduce risk for falls per score 17/24 Dynamic Gait Index Baseline: 8/24 Goal status: IN PROGRESS  5.  Improve postural stability for safety with ADL per mild-moderate sway x 30 sec condition 2 M-CTSIB Baseline: unable Goal status: IN PROGRESS    ASSESSMENT:  CLINICAL IMPRESSION: Pt presents today with continued dizziness.  Worked to review his HEP with VOR from last visit; he reports having difficulty with the pictures/handouts due to size of written instructions. Reviewed and performed HEP, with pt having intense dizziness response to both horizontal and vertical VOR.  Cues provided for ways to lessen such intense response and rationale/explanation provided for habituation of symptoms.  DGI performed with 8/24 score indicating increased fall risk.  Pt will continue to benefit from skilled PT towards goals for improved functional mobility and decreased fall risk.   OBJECTIVE IMPAIRMENTS: Abnormal gait, decreased activity tolerance, decreased balance, difficulty walking, decreased strength, dizziness, and impaired vision/preception.   ACTIVITY LIMITATIONS: carrying, bending, sleeping, transfers, bed mobility, reach over head, and locomotion level  PARTICIPATION LIMITATIONS: meal prep, cleaning, laundry, interpersonal relationship, and community activity  PERSONAL FACTORS: Age, Time since onset of injury/illness/exacerbation, and 3+ comorbidities: PMH are also affecting patient's functional outcome.   REHAB POTENTIAL: Good  CLINICAL  DECISION MAKING: Evolving/moderate complexity  EVALUATION COMPLEXITY: Moderate   PLAN:  PT FREQUENCY: 1x/week  PT DURATION: 6 weeks  PLANNED INTERVENTIONS: 97750- Physical Performance Testing, 97110-Therapeutic exercises, 97530- Therapeutic activity, 97112- Neuromuscular re-education, 97535- Self Care, 86578- Manual therapy, [phone]- Gait training, and [phone]- Canalith repositioning  PLAN FOR NEXT SESSION: positional tests for BPPV and continue HEP review/progression   Dessie Flow, PT 01/16/24 3:52 PM Phone: [phone] Fax: [phone]  [facility] at [facility] [address] [place], [place] [address] Phone # [phone] Fax # [phone]    For all possible CPT codes, reference the Planned Interventions line above.     Check all conditions that are expected to impact treatment: {Conditions expected to impact treatment:Neurological condition and/or seizures   If treatment provided at initial evaluation, no treatment charged due to lack of authorization.

## 2024-01-17 ENCOUNTER — Ambulatory Visit: Payer: MEDICAID | Admitting: Allergy

## 2024-01-23 ENCOUNTER — Ambulatory Visit: Payer: MEDICAID | Admitting: Physical Therapy

## 2024-01-23 DIAGNOSIS — R42 Dizziness and giddiness: Secondary | ICD-10-CM

## 2024-01-23 DIAGNOSIS — R2681 Unsteadiness on feet: Secondary | ICD-10-CM

## 2024-01-23 NOTE — Therapy (Signed)
 OUTPATIENT PHYSICAL THERAPY VESTIBULAR TREATMENT     Patient Name: John Price MRN: [id number] DOB:07-29-1971, 53 y.o., male Today's Date: 01/23/2024  END OF SESSION:  PT End of Session - 01/23/24 1308     Visit Number 3    Number of Visits 7    Date for PT Re-Evaluation 02/14/24    Authorization Type [employer]    Authorization Time Period 01/02/2024-02/14/2024    Authorization - Visit Number 2    Authorization - Number of Visits 7    PT Start Time 1312    PT Stop Time 1400    PT Time Calculation (min) 48 min    Activity Tolerance Patient tolerated treatment well    Behavior During Therapy [facility] for tasks assessed/performed;Anxious            Past Medical History:  Diagnosis Date   Asthma    CHF (congestive heart failure) (HCC)    Chronic kidney disease    Coronary artery disease    Hyperlipidemia    Hypertension    Primary hypertension 12/28/2020   Stroke [facility])    Past Surgical History:  Procedure Laterality Date   ANKLE ARTHODESIS W/ ARTHROSCOPY Left    APPENDECTOMY     CARDIAC VALVE REPLACEMENT     CORONARY ARTERY BYPASS GRAFT  2019   Patient Active Problem List   Diagnosis Date Noted   Dizziness 12/20/2023   Obesity 05/22/2023   Chronic kidney disease 11/30/2021   Cardiomyopathy (HCC) 11/30/2021   Gastroesophageal reflux disease 11/30/2021   Chronic combined systolic and diastolic heart failure (HCC) 11/30/2021   Hyperglycemia 05/02/2021   History of mitral valve replacement with bioprosthetic valve    Hx of CABG    History of heart failure 12/28/2020   Primary hypertension 12/28/2020   Hyperlipidemia 12/28/2020    PCP: Kahoano, Haku K, MD REFERRING PROVIDER:  R42 (ICD-10-CM) - Dizziness    REFERRING DIAG: Karis Clunes, MD  THERAPY DIAG:  Dizziness and giddiness  Unsteadiness on feet  ONSET DATE: 7 years  Rationale for Evaluation and Treatment: Rehabilitation  SUBJECTIVE:   SUBJECTIVE STATEMENT: Dizziness is still the  same.  As I'm sitting here, I'm spinning. Pt accompanied by: self  PERTINENT HISTORY:  From Dr. Rojean note:  Recurrent dizziness for 6 to 7 years.  According to the patient, his dizziness started after his mitral valve surgery.  He describes his dizziness as a spinning vertigo that lasts from minutes to hours.  It is often accompanied by nausea.  He also has a history of anxiety.  He denies any otalgia, otorrhea, tinnitus, or recent change in his hearing.  He has no previous ENT surgery.  He head CT scan in May 2024 was negative.  He has multiple risk factors for dizziness, including hypertension, coronary artery disease, congestive heart failure, and previous stroke.  PAIN:  Are you having pain? No  PRECAUTIONS: Fall  RED FLAGS: None   WEIGHT BEARING RESTRICTIONS: No  FALLS: Has patient fallen in last 6 months? No  LIVING ENVIRONMENT: Lives with: lives alone Lives in: House/apartment Stairs: No Has following equipment at home: Single point cane  PLOF: Independent with household mobility with device, Independent with community mobility with device, and uses transportation services for community  PATIENT GOALS:   OBJECTIVE:   Pt c/o headache upon going into the BPPV test positions  TODAY'S TREATMENT: 01/23/2024 Activity Comments  Reviewed HEP: Seated VOR-horizontal Seated VOR vertical LAQ 2 x 5  Rates 1-2/10 dizziness  Cues for control  R DH Negative/no nystagmus-c/o dizziness-pt slow/guarded, reports of very intense reponse  L DH Negative/no nystagmus-c/o dizziness -pt very slow and guarded into this position and has very intense response of dizziness  R roll Negative  L roll Negative very intense reponse and holds onto mat and has wide V position of long sit when he comes back up to sit  Vitals: 169/103, HR 52 Reports he did take his BP meds (in the last few hours prior to coming to PT)  Vitals after several minutes:  162/106 HR 58 bpm No more headache  Vitals 168/98,  HR 50          HOME EXERCISE PROGRAM: Access Code: [email or web address] URL: [email or web address] Date: 01/16/2024 Prepared by: [facility] - Outpatient  Rehab - [facility]  Exercises - Seated Gaze Stabilization with Head Rotation  - 1 x daily - 7 x weekly - 3-5 sets - 30 sec rounds hold - Seated Gaze Stabilization with Head Nod  - 1 x daily - 7 x weekly - 3-5 sets - 30 sec rounds hold - Seated Long Arc Quad  - 1 x daily - 7 x weekly - 2 sets - 10 reps  PATIENT EDUCATION: Education details: Discussed rationale for assessing for positional vertigo and anatomy of vestibular system; Discussed rationale for exercises to try to habituate symptoms; educated on HTN and CVA symptoms, when he would need to call 911; discussed continueing current HEP for now Person educated: Patient Education method: Explanation, Verbal cues, Handouts, and brief, LARGE handwritten instructions on picture handouts Education comprehension: verbalized understanding, returned demonstration, verbal cues required, and needs further education  ----------------------------------- Note: Objective measures were completed at Evaluation unless otherwise noted.  DIAGNOSTIC FINDINGS: I  MPRESSION: No acute intracranial findings are seen. 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 Atrophy. Small vessel disease. (11/2022)  COGNITION: Overall cognitive status: Within functional limits for tasks assessed   SENSATION: Not tested     DTRs:  NT  POSTURE:  forward head  Cervical ROM:    Active A/PROM (deg) eval  Flexion WFL  Extension Guarded as it provokes symptoms  Right lateral flexion WNL  Left lateral flexion symptomatic  Right rotation WNL  Left rotation WNL  (Blank rows = not tested)   STRENGTH: RLE 5/5, LLE 4/5    BED MOBILITY:  NT reports independent  TRANSFERS: Assistive device utilized: Single point cane  Sit to stand: Modified independence Stand to sit:  Modified independence Chair to chair: Modified independence Floor: NT    CURB: SBA  GAIT: Gait pattern: slow and step to pattern Distance walked:  Assistive device utilized: Single point cane Level of assistance: SBA Comments:   FUNCTIONAL TESTS:  5 times sit to stand: unable-completed one rep and experiences LLE jerking in standing Dynamic Gait Index: TBD  M-CTSIB  Condition 1: Firm Surface, EO 30 Sec, minimal sway but does this odd jerking motion of legs Sway  Condition 2: Firm Surface, EC 3 Sec, increased jerking movements and anterior LOB Sway  Condition 3: Foam Surface, EO  Sec,  Sway  Condition 4: Foam Surface, EC  Sec,  Sway    PATIENT SURVEYS:  DHI 56  VESTIBULAR ASSESSMENT:  GENERAL OBSERVATION: reports significant left visual field cut since onset of CVA   SYMPTOM BEHAVIOR:  Subjective history: 6-7 year hx of dizziness since open heart valve replacement and CVA  Non-Vestibular symptoms: headaches and migraine symptoms  Type of dizziness: Spinning/Vertigo and Unsteady  with head/body turns  Frequency: daily  Duration: minutes to hours  Aggravating factors: Induced by position change: lying supine, rolling to the right, rolling to the left, supine to sit, and sit to stand, Induced by motion: occur when walking, looking up at the ceiling, bending down to the ground, turning body quickly, and turning head quickly, and Worse in the dark  Relieving factors: no known relieving factors  Progression of symptoms: unchanged  OCULOMOTOR EXAM:  Ocular Alignment: normal  Ocular ROM: No Limitations--seems to have excessive left lateral gaze and vertical ROM  Spontaneous Nystagmus: absent  Gaze-Induced Nystagmus: absent  Smooth Pursuits: intact  Saccades: hypometric/undershoots  Convergence/Divergence: 3 cm     VESTIBULAR - OCULAR REFLEX:   Slow VOR: Comment: horizontal--symptomatic/nauseated, vertical--symptomatic/nauseated--difficulty coordinating both movements  VOR  Cancellation: Normal  Head-Impulse Test: Normal  Dynamic Visual Acuity: NT   POSITIONAL TESTING: NT due to time constraints  MOTION SENSITIVITY:  Motion Sensitivity Quotient Intensity: 0 = none, 1 = Lightheaded, 2 = Mild, 3 = Moderate, 4 = Severe, 5 = Vomiting  Intensity  1. Sitting to supine   2. Supine to L side   3. Supine to R side   4. Supine to sitting   5. L Hallpike-Dix   6. Up from L    7. R Hallpike-Dix   8. Up from R    9. Sitting, head tipped to L knee   10. Head up from L knee   11. Sitting, head tipped to R knee   12. Head up from R knee   13. Sitting head turns x5   14.Sitting head nods x5   15. In stance, 180 turn to L    16. In stance, 180 turn to R     OTHOSTATICS: not done                                                                                                                               TREATMENT DATE: 01/02/24   Initiated HEP below  PATIENT EDUCATION: Education details: assessment details, need for further assessment in sessions, HEP initiation Person educated: Patient Education method: Explanation Education comprehension: verbalized understanding  HOME EXERCISE PROGRAM: Access Code: [email or web address] URL: [email or web address] Date: 01/02/2024 Prepared by: Burnard Sandifer  Exercises - Seated Gaze Stabilization with Head Rotation  - 1 x daily - 7 x weekly - 3-5 sets - 30 sec rounds hold - Seated Gaze Stabilization with Head Nod  - 1 x daily - 7 x weekly - 3-5 sets - 30 sec rounds hold  GOALS: Goals reviewed with patient? Yes  SHORT TERM GOALS: Target date: same as LTG    LONG TERM GOALS: Target date: 02/14/2024    The patient will be independent with HEP for gaze adaptation, habituation, balance, and general mobility. Baseline:  Goal status: IN PROGRESS  2.  Report improved symptoms for improved quality of life per score 38 DHI Baseline: 56 Goal status: IN PROGRESS  3.  Demo improved balance and BLE strength  completing 5xSTS test in 20 sec Baseline: one rep Goal status: IN PROGRESS  4.  Demo improved safety with ambulation and reduce risk for falls per score 17/24 Dynamic Gait Index Baseline: 8/24 Goal status: IN PROGRESS  5.  Improve postural stability for safety with ADL per mild-moderate sway x 30 sec condition 2 M-CTSIB Baseline: unable Goal status: IN PROGRESS    ASSESSMENT:  CLINICAL IMPRESSION: Pt presents today and reports continued dizziness. Skilled PT session focused on review of initial HEP from last visit, with pt reporting decreased symptoms and able to perform better technique than last visit. Assessed for BPPV and pt has intense reactions to being in all positions, but no nystagmus noted in any position.  He does report headache; vitals assessed and BP slightly elevated.  Educated on HTN and CVA symptoms.  Pt asymptomatic in regards to headache by end of session.    OBJECTIVE IMPAIRMENTS: Abnormal gait, decreased activity tolerance, decreased balance, difficulty walking, decreased strength, dizziness, and impaired vision/preception.   ACTIVITY LIMITATIONS: carrying, bending, sleeping, transfers, bed mobility, reach over head, and locomotion level  PARTICIPATION LIMITATIONS: meal prep, cleaning, laundry, interpersonal relationship, and community activity  PERSONAL FACTORS: Age, Time since onset of injury/illness/exacerbation, and 3+ comorbidities: PMH are also affecting patient's functional outcome.   REHAB POTENTIAL: Good  CLINICAL DECISION MAKING: Evolving/moderate complexity  EVALUATION COMPLEXITY: Moderate   PLAN:  PT FREQUENCY: 1x/week  PT DURATION: 6 weeks  PLANNED INTERVENTIONS: 97750- Physical Performance Testing, 97110-Therapeutic exercises, 97530- Therapeutic activity, [id number]- Neuromuscular re-education, 97535- Self Care, [id number]- Manual therapy, [id number]- Gait training, and [phone]- Canalith repositioning  PLAN FOR NEXT SESSION: Consider habituation exercises  for HEP:  rolling and ?Brandt-Daroff, Check vitals.  Continue HEP review/progression   Greig Anon, PT 01/23/24 2:05 PM Phone: [phone] Fax: [phone]  [facility] at [facility] [address] [place], [place] [address] Phone # [phone] Fax # [phone]    For all possible CPT codes, reference the Planned Interventions line above.     Check all conditions that are expected to impact treatment: {Conditions expected to impact treatment:Neurological condition and/or seizures   If treatment provided at initial evaluation, no treatment charged due to lack of authorization.

## 2024-01-30 ENCOUNTER — Ambulatory Visit: Payer: MEDICAID | Attending: Otolaryngology | Admitting: Physical Therapy

## 2024-01-30 ENCOUNTER — Encounter: Payer: Self-pay | Admitting: Physical Therapy

## 2024-01-30 DIAGNOSIS — R42 Dizziness and giddiness: Secondary | ICD-10-CM | POA: Insufficient documentation

## 2024-01-30 DIAGNOSIS — R2681 Unsteadiness on feet: Secondary | ICD-10-CM | POA: Insufficient documentation

## 2024-01-30 NOTE — Therapy (Signed)
 OUTPATIENT PHYSICAL THERAPY VESTIBULAR TREATMENT     Patient Name: John Price MRN: [id number] DOB:02/05/1971, 53 y.o., male Today's Date: 01/30/2024  END OF SESSION:  PT End of Session - 01/30/24 1319     Visit Number 4    Number of Visits 7    Date for PT Re-Evaluation 02/14/24    Authorization Type [employer]    Authorization Time Period 01/02/2024-02/14/2024    Authorization - Visit Number 3    Authorization - Number of Visits 7    PT Start Time 1318    PT Stop Time 1400    PT Time Calculation (min) 42 min    Equipment Utilized During Treatment Gait belt    Activity Tolerance Patient tolerated treatment well    Behavior During Therapy WFL for tasks assessed/performed             Past Medical History:  Diagnosis Date   Asthma    CHF (congestive heart failure) (HCC)    Chronic kidney disease    Coronary artery disease    Hyperlipidemia    Hypertension    Primary hypertension 12/28/2020   Stroke [facility])    Past Surgical History:  Procedure Laterality Date   ANKLE ARTHODESIS W/ ARTHROSCOPY Left    APPENDECTOMY     CARDIAC VALVE REPLACEMENT     CORONARY ARTERY BYPASS GRAFT  2019   Patient Active Problem List   Diagnosis Date Noted   Dizziness 12/20/2023   Obesity 05/22/2023   Chronic kidney disease 11/30/2021   Cardiomyopathy (HCC) 11/30/2021   Gastroesophageal reflux disease 11/30/2021   Chronic combined systolic and diastolic heart failure (HCC) 11/30/2021   Hyperglycemia 05/02/2021   History of mitral valve replacement with bioprosthetic valve    Hx of CABG    History of heart failure 12/28/2020   Primary hypertension 12/28/2020   Hyperlipidemia 12/28/2020    PCP: Kahoano, Haku K, MD REFERRING PROVIDER:  R42 (ICD-10-CM) - Dizziness    REFERRING DIAG: John Clunes, MD  THERAPY DIAG:  Dizziness and giddiness  Unsteadiness on feet  ONSET DATE: 7 years  Rationale for Evaluation and Treatment: Rehabilitation  SUBJECTIVE:    SUBJECTIVE STATEMENT: I'm trying to walk some near my house and that makes me feel better.  He reports trying to push things so that he feels better. Pt accompanied by: self  PERTINENT HISTORY:  From Dr. Rojean note:  Recurrent dizziness for 6 to 7 years.  According to the patient, his dizziness started after his mitral valve surgery.  He describes his dizziness as a spinning vertigo that lasts from minutes to hours.  It is often accompanied by nausea.  He also has a history of anxiety.  He denies any otalgia, otorrhea, tinnitus, or recent change in his hearing.  He has no previous ENT surgery.  He head CT scan in May 2024 was negative.  He has multiple risk factors for dizziness, including hypertension, coronary artery disease, congestive heart failure, and previous stroke.  PAIN:  Are you having pain? No  PRECAUTIONS: Fall  RED FLAGS: None   WEIGHT BEARING RESTRICTIONS: No  FALLS: Has patient fallen in last 6 months? No  LIVING ENVIRONMENT: Lives with: lives alone Lives in: House/apartment Stairs: No Has following equipment at home: Single point cane  PLOF: Independent with household mobility with device, Independent with community mobility with device, and uses transportation services for community  PATIENT GOALS:   OBJECTIVE:    TODAY'S TREATMENT: 01/30/2024 Activity Comments  Standing gaze  stabilization  Support at chair-cues for technique  Sidestepping At counter, 3 reps  Braiding-front crossover steps At counter, 2 reps  Romberg EC 3 x 10 Romberg EC head turns/nods Mild-mod sway, UE support  180 degree turns in doorway slowed  Gait with 180 turn and stop, 8 reps Improves stability with repetition   HOME EXERCISE PROGRAM: Access Code: [email or web address] URL: [email or web address] Date: 01/30/2024 Prepared by: [facility] - Outpatient  Rehab - [facility]  Exercises - Seated Long Arc Quad  - 1 x daily - 7 x weekly - 2 sets - 10 reps - Standing Gaze  Stabilization with Head Rotation  - 1 x daily - 7 x weekly - 3 sets - 30 sec hold - Standing Gaze Stabilization with Head Nod  - 1 x daily - 7 x weekly - 3 sets - 30 sec hold - Carioca with Counter Support  - 1 x daily - 7 x weekly - 1 sets - 3-5 reps - Walking With 180 Degree Turns  - 1 x daily - 7 x weekly - 1 sets - 3-5 reps   PATIENT EDUCATION: Education details: Progression of HEP Person educated: Patient Education method: Explanation, Verbal cues, Handouts, and brief, LARGE handwritten instructions on picture handouts Education comprehension: verbalized understanding, returned demonstration, verbal cues required, and needs further education  ----------------------------------- Note: Objective measures were completed at Evaluation unless otherwise noted.  DIAGNOSTIC FINDINGS: I  MPRESSION: No acute intracranial findings are seen. 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 Atrophy. Small vessel disease. (11/2022)  COGNITION: Overall cognitive status: Within functional limits for tasks assessed   SENSATION: Not tested     DTRs:  NT  POSTURE:  forward head  Cervical ROM:    Active A/PROM (deg) eval  Flexion WFL  Extension Guarded as it provokes symptoms  Right lateral flexion WNL  Left lateral flexion symptomatic  Right rotation WNL  Left rotation WNL  (Blank rows = not tested)   STRENGTH: RLE 5/5, LLE 4/5    BED MOBILITY:  NT reports independent  TRANSFERS: Assistive device utilized: Single point cane  Sit to stand: Modified independence Stand to sit: Modified independence Chair to chair: Modified independence Floor: NT    CURB: SBA  GAIT: Gait pattern: slow and step to pattern Distance walked:  Assistive device utilized: Single point cane Level of assistance: SBA Comments:   FUNCTIONAL TESTS:  5 times sit to stand: unable-completed one rep and experiences LLE jerking in standing Dynamic Gait Index: TBD  M-CTSIB   Condition 1: Firm Surface, EO 30 Sec, minimal sway but does this odd jerking motion of legs Sway  Condition 2: Firm Surface, EC 3 Sec, increased jerking movements and anterior LOB Sway  Condition 3: Foam Surface, EO  Sec,  Sway  Condition 4: Foam Surface, EC  Sec,  Sway    PATIENT SURVEYS:  DHI 56  VESTIBULAR ASSESSMENT:  GENERAL OBSERVATION: reports significant left visual field cut since onset of CVA   SYMPTOM BEHAVIOR:  Subjective history: 6-7 year hx of dizziness since open heart valve replacement and CVA  Non-Vestibular symptoms: headaches and migraine symptoms  Type of dizziness: Spinning/Vertigo and Unsteady with head/body turns  Frequency: daily  Duration: minutes to hours  Aggravating factors: Induced by position change: lying supine, rolling to the right, rolling to the left, supine to sit, and sit to stand, Induced by motion: occur when walking, looking up at the ceiling, bending down to the ground, turning body quickly, and  turning head quickly, and Worse in the dark  Relieving factors: no known relieving factors  Progression of symptoms: unchanged  OCULOMOTOR EXAM:  Ocular Alignment: normal  Ocular ROM: No Limitations--seems to have excessive left lateral gaze and vertical ROM  Spontaneous Nystagmus: absent  Gaze-Induced Nystagmus: absent  Smooth Pursuits: intact  Saccades: hypometric/undershoots  Convergence/Divergence: 3 cm     VESTIBULAR - OCULAR REFLEX:   Slow VOR: Comment: horizontal--symptomatic/nauseated, vertical--symptomatic/nauseated--difficulty coordinating both movements  VOR Cancellation: Normal  Head-Impulse Test: Normal  Dynamic Visual Acuity: NT   POSITIONAL TESTING: NT due to time constraints  MOTION SENSITIVITY:  Motion Sensitivity Quotient Intensity: 0 = none, 1 = Lightheaded, 2 = Mild, 3 = Moderate, 4 = Severe, 5 = Vomiting  Intensity  1. Sitting to supine   2. Supine to L side   3. Supine to R side   4. Supine to sitting   5. L  Hallpike-Dix   6. Up from L    7. R Hallpike-Dix   8. Up from R    9. Sitting, head tipped to L knee   10. Head up from L knee   11. Sitting, head tipped to R knee   12. Head up from R knee   13. Sitting head turns x5   14.Sitting head nods x5   15. In stance, 180 turn to L    16. In stance, 180 turn to R     OTHOSTATICS: not done                                                                                                                               TREATMENT DATE: 01/02/24   Initiated HEP below  PATIENT EDUCATION: Education details: assessment details, need for further assessment in sessions, HEP initiation Person educated: Patient Education method: Explanation Education comprehension: verbalized understanding  HOME EXERCISE PROGRAM: Access Code: [email or web address] URL: [email or web address] Date: 01/02/2024 Prepared by: Burnard Sandifer  Exercises - Seated Gaze Stabilization with Head Rotation  - 1 x daily - 7 x weekly - 3-5 sets - 30 sec rounds hold - Seated Gaze Stabilization with Head Nod  - 1 x daily - 7 x weekly - 3-5 sets - 30 sec rounds hold  GOALS: Goals reviewed with patient? Yes  SHORT TERM GOALS: Target date: same as LTG    LONG TERM GOALS: Target date: 02/14/2024    The patient will be independent with HEP for gaze adaptation, habituation, balance, and general mobility. Baseline:  Goal status: IN PROGRESS  2.  Report improved symptoms for improved quality of life per score 38 DHI Baseline: 56 Goal status: IN PROGRESS  3.  Demo improved balance and BLE strength completing 5xSTS test in 20 sec Baseline: one rep Goal status: IN PROGRESS  4.  Demo improved safety with ambulation and reduce risk for falls per score 17/24 Dynamic Gait Index Baseline: 8/24 Goal status: IN PROGRESS  5.  Improve postural stability for safety  with ADL per mild-moderate sway x 30 sec condition 2 M-CTSIB Baseline: unable Goal status: IN  PROGRESS    ASSESSMENT:  CLINICAL IMPRESSION: Pt presents today and reports he is feeling better; he is visibly more calm and has decreased jerky movements in session today.  He is able to perform exercises today without increased reports of dizziness. Skilled PT session focused on progression of HEP-worked to progress gaze stabilization in standing as well as counter balance exercises and gait with 180 turns.  He still needs UE support for the counter ex and gaze stabilization, but he is able to perform gait and turns without a cane in session today.  Reinforced to patient his progress with activities able to be performed in session today; he seems pleased and does not have any complaints upon leaving at end of session. Pt will continue to benefit from skilled PT towards goals for improved functional mobility and decreased dizziness.   OBJECTIVE IMPAIRMENTS: Abnormal gait, decreased activity tolerance, decreased balance, difficulty walking, decreased strength, dizziness, and impaired vision/preception.   ACTIVITY LIMITATIONS: carrying, bending, sleeping, transfers, bed mobility, reach over head, and locomotion level  PARTICIPATION LIMITATIONS: meal prep, cleaning, laundry, interpersonal relationship, and community activity  PERSONAL FACTORS: Age, Time since onset of injury/illness/exacerbation, and 3+ comorbidities: PMH are also affecting patient's functional outcome.   REHAB POTENTIAL: Good  CLINICAL DECISION MAKING: Evolving/moderate complexity  EVALUATION COMPLEXITY: Moderate   PLAN:  PT FREQUENCY: 1x/week  PT DURATION: 6 weeks  PLANNED INTERVENTIONS: 97750- Physical Performance Testing, 97110-Therapeutic exercises, 97530- Therapeutic activity, 97112- Neuromuscular re-education, 97535- Self Care, 02859- Manual therapy, [id number]- Gait training, and [id number]- Canalith repositioning  PLAN FOR NEXT SESSION:  Check LTGs and discuss POC; he will need additional auth to extend dates if you  recert    Greig Anon, PT 01/30/24 3:01 PM Phone: [phone] Fax: [phone]  [facility] [address], [address] [place], [place] [address] Phone # [phone] Fax # [phone]    For all possible CPT codes, reference the Planned Interventions line above.     Check all conditions that are expected to impact treatment: {Conditions expected to impact treatment:Neurological condition and/or seizures   If treatment provided at initial evaluation, no treatment charged due to lack of authorization.

## 2024-02-05 ENCOUNTER — Ambulatory Visit: Payer: MEDICAID | Admitting: Physical Therapy

## 2024-02-06 ENCOUNTER — Ambulatory Visit (INDEPENDENT_AMBULATORY_CARE_PROVIDER_SITE_OTHER): Payer: MEDICAID | Admitting: Otolaryngology

## 2024-02-06 ENCOUNTER — Other Ambulatory Visit: Payer: Self-pay | Admitting: Nephrology

## 2024-02-06 DIAGNOSIS — N281 Cyst of kidney, acquired: Secondary | ICD-10-CM

## 2024-02-06 DIAGNOSIS — N1832 Chronic kidney disease, stage 3b: Secondary | ICD-10-CM

## 2024-02-11 ENCOUNTER — Other Ambulatory Visit: Payer: MEDICAID

## 2024-02-13 ENCOUNTER — Ambulatory Visit: Payer: MEDICAID

## 2024-02-13 DIAGNOSIS — R2681 Unsteadiness on feet: Secondary | ICD-10-CM

## 2024-02-13 DIAGNOSIS — R42 Dizziness and giddiness: Secondary | ICD-10-CM | POA: Diagnosis not present

## 2024-02-13 NOTE — Therapy (Signed)
 OUTPATIENT PHYSICAL THERAPY VESTIBULAR TREATMENT, Progress Note, and Recertification     Patient Name: John Price MRN: [id number] DOB:11-Oct-1970, 53 y.o., male Today's Date: 02/13/2024  Progress Note Reporting Period 01/02/24 to 02/13/24  See note below for Objective Data and Assessment of Progress/Goals.     END OF SESSION:  PT End of Session - 02/13/24 1405     Visit Number 5    Number of Visits 9    Date for PT Re-Evaluation 03/19/24    Authorization Type [employer]    Authorization Time Period auth resubmitted 02/13/24    Authorization - Visit Number 4    Authorization - Number of Visits 7    PT Start Time 1405    PT Stop Time 1445    PT Time Calculation (min) 40 min    Equipment Utilized During Treatment Gait belt    Activity Tolerance Patient tolerated treatment well    Behavior During Therapy WFL for tasks assessed/performed             Past Medical History:  Diagnosis Date   Asthma    CHF (congestive heart failure) (HCC)    Chronic kidney disease    Coronary artery disease    Hyperlipidemia    Hypertension    Primary hypertension 12/28/2020   Stroke [facility])    Past Surgical History:  Procedure Laterality Date   ANKLE ARTHODESIS W/ ARTHROSCOPY Left    APPENDECTOMY     CARDIAC VALVE REPLACEMENT     CORONARY ARTERY BYPASS GRAFT  2019   Patient Active Problem List   Diagnosis Date Noted   Dizziness 12/20/2023   Obesity 05/22/2023   Chronic kidney disease 11/30/2021   Cardiomyopathy (HCC) 11/30/2021   Gastroesophageal reflux disease 11/30/2021   Chronic combined systolic and diastolic heart failure (HCC) 11/30/2021   Hyperglycemia 05/02/2021   History of mitral valve replacement with bioprosthetic valve    Hx of CABG    History of heart failure 12/28/2020   Primary hypertension 12/28/2020   Hyperlipidemia 12/28/2020    PCP: Kahoano, Haku K, MD REFERRING PROVIDER:  R42 (ICD-10-CM) - Dizziness    REFERRING DIAG: Karis Clunes,  MD  THERAPY DIAG:  Dizziness and giddiness  Unsteadiness on feet  ONSET DATE: 7 years  Rationale for Evaluation and Treatment: Rehabilitation  SUBJECTIVE:   SUBJECTIVE STATEMENT: Symptoms of dizziness are still present and fluctuate depending on activity Pt accompanied by: self  PERTINENT HISTORY:  From Dr. Rojean note:  Recurrent dizziness for 6 to 7 years.  According to the patient, his dizziness started after his mitral valve surgery.  He describes his dizziness as a spinning vertigo that lasts from minutes to hours.  It is often accompanied by nausea.  He also has a history of anxiety.  He denies any otalgia, otorrhea, tinnitus, or recent change in his hearing.  He has no previous ENT surgery.  He head CT scan in May 2024 was negative.  He has multiple risk factors for dizziness, including hypertension, coronary artery disease, congestive heart failure, and previous stroke.  PAIN:  Are you having pain? No  PRECAUTIONS: Fall  RED FLAGS: None   WEIGHT BEARING RESTRICTIONS: No  FALLS: Has patient fallen in last 6 months? No  LIVING ENVIRONMENT: Lives with: lives alone Lives in: House/apartment Stairs: No Has following equipment at home: Single point cane  PLOF: Independent with household mobility with device, Independent with community mobility with device, and uses transportation services for community  PATIENT GOALS:  OBJECTIVE:   TODAY'S TREATMENT: 02/13/24 Activity Comments  DHI  70/100 (baseline 56/100)  Floor to sit transfers Modified independent--demo technique for lowering to floor for item pick up to avoid bending  5xSTS test 24.87 sec  10 meter walk test 8.65 sec w/ cane = 3.79 ft/sec  Dynamic Gait Index 14/24 (improved from 8)  HEP review            [facility] PT Assessment - 02/13/24 0001       Standardized Balance Assessment   Standardized Balance Assessment 10 meter walk test    10 Meter Walk 8.65 sec = 3.79 ft/sec      Dynamic Gait Index    Level Surface Normal    Change in Gait Speed Mild Impairment    Gait with Horizontal Head Turns Moderate Impairment    Gait with Vertical Head Turns Severe Impairment    Gait and Pivot Turn Mild Impairment    Step Over Obstacle Normal    Step Around Obstacles Moderate Impairment    Steps Mild Impairment    Total Score 14           TODAY'S TREATMENT: 01/30/2024 Activity Comments  Standing gaze stabilization  Support at chair-cues for technique  Sidestepping At counter, 3 reps  Braiding-front crossover steps At counter, 2 reps  Romberg EC 3 x 10 Romberg EC head turns/nods Mild-mod sway, UE support  180 degree turns in doorway slowed  Gait with 180 turn and stop, 8 reps Improves stability with repetition   HOME EXERCISE PROGRAM: Access Code: [email or web address] URL: [email or web address] Date: 01/30/2024 Prepared by: [facility] - Outpatient  Rehab - [facility]  Exercises - Seated Long Arc Quad  - 1 x daily - 7 x weekly - 2 sets - 10 reps - Standing Gaze Stabilization with Head Rotation  - 1 x daily - 7 x weekly - 3 sets - 30 sec hold - Standing Gaze Stabilization with Head Nod  - 1 x daily - 7 x weekly - 3 sets - 30 sec hold - Carioca with Counter Support  - 1 x daily - 7 x weekly - 1 sets - 3-5 reps - Walking With 180 Degree Turns  - 1 x daily - 7 x weekly - 1 sets - 3-5 reps   PATIENT EDUCATION: Education details: Progression of HEP Person educated: Patient Education method: Explanation, Verbal cues, Handouts, and brief, LARGE handwritten instructions on picture handouts Education comprehension: verbalized understanding, returned demonstration, verbal cues required, and needs further education  ----------------------------------- Note: Objective measures were completed at Evaluation unless otherwise noted.  DIAGNOSTIC FINDINGS: I  MPRESSION: No acute intracranial findings are seen. 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 Atrophy. Small vessel  disease. (11/2022)  COGNITION: Overall cognitive status: Within functional limits for tasks assessed   SENSATION: Not tested     DTRs:  NT  POSTURE:  forward head  Cervical ROM:    Active A/PROM (deg) eval  Flexion WFL  Extension Guarded as it provokes symptoms  Right lateral flexion WNL  Left lateral flexion symptomatic  Right rotation WNL  Left rotation WNL  (Blank rows = not tested)   STRENGTH: RLE 5/5, LLE 4/5    BED MOBILITY:  NT reports independent  TRANSFERS: Assistive device utilized: Single point cane  Sit to stand: Modified independence Stand to sit: Modified independence Chair to chair: Modified independence Floor: NT    CURB: SBA  GAIT: Gait pattern: slow and step to pattern Distance walked:  Assistive device utilized: Single point cane Level of assistance: SBA Comments:   FUNCTIONAL TESTS:  5 times sit to stand: unable-completed one rep and experiences LLE jerking in standing Dynamic Gait Index: TBD  M-CTSIB  Condition 1: Firm Surface, EO 30 Sec, minimal sway but does this odd jerking motion of legs Sway  Condition 2: Firm Surface, EC 3 Sec, increased jerking movements and anterior LOB Sway  Condition 3: Foam Surface, EO  Sec,  Sway  Condition 4: Foam Surface, EC  Sec,  Sway    PATIENT SURVEYS:  DHI 56  VESTIBULAR ASSESSMENT:  GENERAL OBSERVATION: reports significant left visual field cut since onset of CVA   SYMPTOM BEHAVIOR:  Subjective history: 6-7 year hx of dizziness since open heart valve replacement and CVA  Non-Vestibular symptoms: headaches and migraine symptoms  Type of dizziness: Spinning/Vertigo and Unsteady with head/body turns  Frequency: daily  Duration: minutes to hours  Aggravating factors: Induced by position change: lying supine, rolling to the right, rolling to the left, supine to sit, and sit to stand, Induced by motion: occur when walking, looking up at the ceiling, bending down to the ground, turning  body quickly, and turning head quickly, and Worse in the dark  Relieving factors: no known relieving factors  Progression of symptoms: unchanged  OCULOMOTOR EXAM:  Ocular Alignment: normal  Ocular ROM: No Limitations--seems to have excessive left lateral gaze and vertical ROM  Spontaneous Nystagmus: absent  Gaze-Induced Nystagmus: absent  Smooth Pursuits: intact  Saccades: hypometric/undershoots  Convergence/Divergence: 3 cm     VESTIBULAR - OCULAR REFLEX:   Slow VOR: Comment: horizontal--symptomatic/nauseated, vertical--symptomatic/nauseated--difficulty coordinating both movements  VOR Cancellation: Normal  Head-Impulse Test: Normal  Dynamic Visual Acuity: NT   POSITIONAL TESTING: NT due to time constraints  MOTION SENSITIVITY:  Motion Sensitivity Quotient Intensity: 0 = none, 1 = Lightheaded, 2 = Mild, 3 = Moderate, 4 = Severe, 5 = Vomiting  Intensity  1. Sitting to supine   2. Supine to L side   3. Supine to R side   4. Supine to sitting   5. L Hallpike-Dix   6. Up from L    7. R Hallpike-Dix   8. Up from R    9. Sitting, head tipped to L knee   10. Head up from L knee   11. Sitting, head tipped to R knee   12. Head up from R knee   13. Sitting head turns x5   14.Sitting head nods x5   15. In stance, 180 turn to L    16. In stance, 180 turn to R     OTHOSTATICS: not done                                                                                                                               TREATMENT DATE: 01/02/24   GOALS: Goals reviewed with patient? Yes  SHORT TERM GOALS: Target date: same as LTG  LONG TERM GOALS: Target date: 02/14/2024    The patient will be independent with HEP for gaze adaptation, habituation, balance, and general mobility. Baseline:  Goal status: IN PROGRESS  2.  Report improved symptoms for improved quality of life per score 38 DHI Baseline: 56; 70 Goal status: IN PROGRESS 02/13/24  3.  Demo improved balance and  BLE strength completing 5xSTS test in 20 sec Baseline: one rep; 24 sec Goal status: IN PROGRESS 02/13/24  4.  Demo improved safety with ambulation and reduce risk for falls per score 17/24 Dynamic Gait Index Baseline: 8/24; 14/24 Goal status: IN PROGRESS 02/13/24  5.  Improve postural stability for safety with ADL per mild-moderate sway x 30 sec condition 2 M-CTSIB Baseline: unable; DNT due to time Goal status: IN PROGRESS 02/13/24    ASSESSMENT:  CLINICAL IMPRESSION: Progress note/POC performance and review.  Reports ongoing symptoms and repeat to Dizziness Handicap Inventory score of 70/100 which would manifest increased symptoms from his baseline 56/100.  Despite self-report of increased symptom/deficits performance for 5xSTS test improved requiring 24 sec to complete vs outset he was only able to perform a single repetition of sit to stand due to symptoms.  Dynamic Gait Index score improved from baseline 8/24 to 14/24 and is observed to ambulate level surfaces at a brisk pace of 3.79 ft/sec w/ cane; however, demonstrates difficulty/unsteadiness with negotiating turns.  Continued sessions would ben beneficial to progress POC details to reduce symptoms and reduce risk for falls.  OBJECTIVE IMPAIRMENTS: Abnormal gait, decreased activity tolerance, decreased balance, difficulty walking, decreased strength, dizziness, and impaired vision/preception.   ACTIVITY LIMITATIONS: carrying, bending, sleeping, transfers, bed mobility, reach over head, and locomotion level  PARTICIPATION LIMITATIONS: meal prep, cleaning, laundry, interpersonal relationship, and community activity  PERSONAL FACTORS: Age, Time since onset of injury/illness/exacerbation, and 3+ comorbidities: PMH are also affecting patient's functional outcome.   REHAB POTENTIAL: Good  CLINICAL DECISION MAKING: Evolving/moderate complexity  EVALUATION COMPLEXITY: Moderate   PLAN:  PT FREQUENCY: 1x/week  PT DURATION: 4  weeks  PLANNED INTERVENTIONS: 97750- Physical Performance Testing, 97110-Therapeutic exercises, 97530- Therapeutic activity, [id number]- Neuromuscular re-education, 97535- Self Care, 02859- Manual therapy, [id number]- Gait training, and [phone]- Canalith repositioning  PLAN FOR NEXT SESSION:  M-CTSIB, additional habituation?   4:32 PM, 02/13/24 M. Kelly Grayling Schranz, PT, DPT Physical Therapist-  Office Number: [phone]

## 2024-02-18 ENCOUNTER — Other Ambulatory Visit: Payer: MEDICAID

## 2024-02-20 ENCOUNTER — Ambulatory Visit: Payer: MEDICAID | Admitting: Physical Therapy

## 2024-02-21 ENCOUNTER — Ambulatory Visit
Admission: RE | Admit: 2024-02-21 | Discharge: 2024-02-21 | Disposition: A | Discharge status: Home / Self Care | Payer: MEDICAID | Source: Ambulatory Visit | Attending: Nephrology | Admitting: Nephrology

## 2024-02-21 DIAGNOSIS — N1832 Chronic kidney disease, stage 3b: Secondary | ICD-10-CM

## 2024-02-21 DIAGNOSIS — N281 Cyst of kidney, acquired: Secondary | ICD-10-CM

## 2024-02-25 ENCOUNTER — Other Ambulatory Visit: Payer: Self-pay | Admitting: Pharmacist

## 2024-02-25 DIAGNOSIS — I69359 Hemiplegia and hemiparesis following cerebral infarction affecting unspecified side: Secondary | ICD-10-CM

## 2024-02-25 DIAGNOSIS — Z951 Presence of aortocoronary bypass graft: Secondary | ICD-10-CM

## 2024-02-25 MED ORDER — WEGOVY 0.25 MG/0.5ML ~~LOC~~ SOAJ
0.2500 mg | SUBCUTANEOUS | 0 refills | Status: DC
Start: 2024-02-25 — End: 2024-03-03

## 2024-02-28 ENCOUNTER — Ambulatory Visit: Payer: MEDICAID

## 2024-02-29 ENCOUNTER — Telehealth: Payer: Self-pay | Admitting: Pharmacy Technician

## 2024-02-29 NOTE — Telephone Encounter (Signed)
  Waiting on questions to populate

## 2024-02-29 NOTE — Telephone Encounter (Signed)
 Pharmacy Patient Advocate Encounter   Received notification from CoverMyMeds that prior authorization for wegovy  0.25mg  is required/requested.   Insurance verification completed.   The patient is insured through [facility] .   Per test claim: PA required; PA submitted to above mentioned insurance via latent Key/confirmation #/EOC [id number] Status is pending

## 2024-03-03 ENCOUNTER — Telehealth: Payer: Self-pay

## 2024-03-03 ENCOUNTER — Other Ambulatory Visit (HOSPITAL_COMMUNITY): Payer: Self-pay

## 2024-03-03 DIAGNOSIS — I69359 Hemiplegia and hemiparesis following cerebral infarction affecting unspecified side: Secondary | ICD-10-CM

## 2024-03-03 DIAGNOSIS — Z951 Presence of aortocoronary bypass graft: Secondary | ICD-10-CM

## 2024-03-03 MED ORDER — WEGOVY 0.25 MG/0.5ML ~~LOC~~ SOAJ: 0.2500 mg | SUBCUTANEOUS | 0 refills | Status: DC

## 2024-03-03 NOTE — Addendum Note (Signed)
 Addended by: Findley Vi D on: 03/03/2024 04:02 PM   Modules accepted: Orders

## 2024-03-03 NOTE — Telephone Encounter (Signed)
 Spoke with patient.  He does not know what his weight is nor does he have access to a scale.  He says he saw his primary care most recently maybe in the last month.  I will call primary care office and see if they have a weight that they can fax over to me.  He asks about seeing Dr. Eddy.  He is due in October, gave him the number for the main line to call to schedule.  After I got off the phone I found a fax from his primary care doctor with his weight from 02/14/2024.  131 pounds.  On page 40 of the fax in media dated 02/15/2024 referral notes

## 2024-03-03 NOTE — Telephone Encounter (Signed)
 Hi, I saw you have spoken to him in the past about wegovy . His insurance denied the wegovy  since there is no documentation of his weight in the last 45 days. Can that be done please? Then we can send to his plan for reconsideration. Thank you! The denial is scanned in media for reference

## 2024-03-03 NOTE — Telephone Encounter (Signed)
 Pharmacy Patient Advocate Encounter  Received notification from [facility] that Prior Authorization for WEGOVY  has been APPROVED from 03/03/24 to 09/03/24. Ran test claim, Copay is $4. This test claim was processed through [facility]- copay amounts may vary at other pharmacies due to pharmacy/plan contracts, or as the patient moves through the different stages of their insurance plan.

## 2024-03-03 NOTE — Telephone Encounter (Signed)
 Pharmacy Patient Advocate Encounter   Received notification from Physician's Office that prior authorization for WEGOVY  is required/requested.   Insurance verification completed.   The patient is insured through [facility] .   Per test claim: PA required; PA submitted to above mentioned insurance via CoverMyMeds Key/confirmation #/EOC [id number] Status is pending

## 2024-03-03 NOTE — Telephone Encounter (Signed)
 Rx sent to pt pharmacy

## 2024-03-03 NOTE — Telephone Encounter (Signed)
 PA request has been Submitted. New Encounter has been or will be created for follow up. For additional info see Pharmacy Prior Auth telephone encounter from 03/03/24.

## 2024-03-05 ENCOUNTER — Ambulatory Visit: Payer: MEDICAID | Admitting: Physical Therapy

## 2024-03-12 ENCOUNTER — Ambulatory Visit: Payer: MEDICAID

## 2024-03-19 ENCOUNTER — Ambulatory Visit: Payer: MEDICAID | Attending: Otolaryngology | Admitting: Physical Therapy

## 2024-03-19 NOTE — Therapy (Incomplete)
 OUTPATIENT PHYSICAL THERAPY VESTIBULAR TREATMENT, Progress Note, and Recertification     Patient Name: John Price MRN: [id number] DOB:1970/09/25, 53 y.o., male Today's Date: 03/19/2024  Progress Note Reporting Period 01/02/24 to 02/13/24  See note below for Objective Data and Assessment of Progress/Goals.     END OF SESSION:       Past Medical History:  Diagnosis Date   Asthma    CHF (congestive heart failure) (HCC)    Chronic kidney disease    Coronary artery disease    Hyperlipidemia    Hypertension    Primary hypertension 12/28/2020   Stroke [facility])    Past Surgical History:  Procedure Laterality Date   ANKLE ARTHODESIS W/ ARTHROSCOPY Left    APPENDECTOMY     CARDIAC VALVE REPLACEMENT     CORONARY ARTERY BYPASS GRAFT  2019   Patient Active Problem List   Diagnosis Date Noted   Dizziness 12/20/2023   Obesity 05/22/2023   Chronic kidney disease 11/30/2021   Cardiomyopathy (HCC) 11/30/2021   Gastroesophageal reflux disease 11/30/2021   Chronic combined systolic and diastolic heart failure (HCC) 11/30/2021   Hyperglycemia 05/02/2021   History of mitral valve replacement with bioprosthetic valve    Hx of CABG    History of heart failure 12/28/2020   Primary hypertension 12/28/2020   Hyperlipidemia 12/28/2020    PCP: Kahoano, Haku K, MD REFERRING PROVIDER:  R42 (ICD-10-CM) - Dizziness    REFERRING DIAG: Karis Clunes, MD  THERAPY DIAG:  No diagnosis found.  ONSET DATE: 7 years  Rationale for Evaluation and Treatment: Rehabilitation  SUBJECTIVE:   SUBJECTIVE STATEMENT: Symptoms of dizziness are still present and fluctuate depending on activity Pt accompanied by: self  PERTINENT HISTORY:  From Dr. Rojean note:  Recurrent dizziness for 6 to 7 years.  According to the patient, his dizziness started after his mitral valve surgery.  He describes his dizziness as a spinning vertigo that lasts from minutes to hours.  It is often accompanied by nausea.   He also has a history of anxiety.  He denies any otalgia, otorrhea, tinnitus, or recent change in his hearing.  He has no previous ENT surgery.  He head CT scan in May 2024 was negative.  He has multiple risk factors for dizziness, including hypertension, coronary artery disease, congestive heart failure, and previous stroke.  PAIN:  Are you having pain? No  PRECAUTIONS: Fall  RED FLAGS: None   WEIGHT BEARING RESTRICTIONS: No  FALLS: Has patient fallen in last 6 months? No  LIVING ENVIRONMENT: Lives with: lives alone Lives in: House/apartment Stairs: No Has following equipment at home: Single point cane  PLOF: Independent with household mobility with device, Independent with community mobility with device, and uses transportation services for community  PATIENT GOALS:   OBJECTIVE:    TODAY'S TREATMENT: 03/19/2024 Activity Comments                      TODAY'S TREATMENT: 02/13/24 Activity Comments  DHI  70/100 (baseline 56/100)  Floor to sit transfers Modified independent--demo technique for lowering to floor for item pick up to avoid bending  5xSTS test 24.87 sec  10 meter walk test 8.65 sec w/ cane = 3.79 ft/sec  Dynamic Gait Index 14/24 (improved from 8)  HEP review               TODAY'S TREATMENT: 01/30/2024 Activity Comments  Standing gaze stabilization  Support at chair-cues for technique  Sidestepping At counter, 3 reps  Braiding-front crossover steps At counter, 2 reps  Romberg EC 3 x 10 Romberg EC head turns/nods Mild-mod sway, UE support  180 degree turns in doorway slowed  Gait with 180 turn and stop, 8 reps Improves stability with repetition   HOME EXERCISE PROGRAM: Access Code: [email or web address] URL: [email or web address] Date: 01/30/2024 Prepared by: John Price  Exercises - Seated Long Arc Quad  - 1 x daily - 7 x weekly - 2 sets - 10 reps - Standing Gaze Stabilization with Head Rotation  - 1 x  daily - 7 x weekly - 3 sets - 30 sec hold - Standing Gaze Stabilization with Head Nod  - 1 x daily - 7 x weekly - 3 sets - 30 sec hold - Carioca with Counter Support  - 1 x daily - 7 x weekly - 1 sets - 3-5 reps - Walking With 180 Degree Turns  - 1 x daily - 7 x weekly - 1 sets - 3-5 reps   PATIENT EDUCATION: Education details: Progression of HEP Person educated: Patient Education method: Explanation, Verbal cues, Handouts, and brief, LARGE handwritten instructions on picture handouts Education comprehension: verbalized understanding, returned demonstration, verbal cues required, and needs further education  ----------------------------------- Note: Objective measures were completed at Evaluation unless otherwise noted.  DIAGNOSTIC FINDINGS: I  MPRESSION: No acute intracranial findings are seen. 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 Atrophy. Small vessel disease. (11/2022)  COGNITION: Overall cognitive status: Within functional limits for tasks assessed   SENSATION: Not tested     DTRs:  NT  POSTURE:  forward head  Cervical ROM:    Active A/PROM (deg) eval  Flexion WFL  Extension Guarded as it provokes symptoms  Right lateral flexion WNL  Left lateral flexion symptomatic  Right rotation WNL  Left rotation WNL  (Blank rows = not tested)   STRENGTH: RLE 5/5, LLE 4/5    BED MOBILITY:  NT reports independent  TRANSFERS: Assistive device utilized: Single point cane  Sit to stand: Modified independence Stand to sit: Modified independence Chair to chair: Modified independence Floor: NT    CURB: SBA  GAIT: Gait pattern: slow and step to pattern Distance walked:  Assistive device utilized: Single point cane Level of assistance: SBA Comments:   FUNCTIONAL TESTS:  5 times sit to stand: unable-completed one rep and experiences LLE jerking in standing Dynamic Gait Index: TBD  M-CTSIB  Condition 1: Firm Surface, EO 30 Sec, minimal  sway but does this odd jerking motion of legs Sway  Condition 2: Firm Surface, EC 3 Sec, increased jerking movements and anterior LOB Sway  Condition 3: Foam Surface, EO  Sec,  Sway  Condition 4: Foam Surface, EC  Sec,  Sway    PATIENT SURVEYS:  DHI 56  VESTIBULAR ASSESSMENT:  GENERAL OBSERVATION: reports significant left visual field cut since onset of CVA   SYMPTOM BEHAVIOR:  Subjective history: 6-7 year hx of dizziness since open heart valve replacement and CVA  Non-Vestibular symptoms: headaches and migraine symptoms  Type of dizziness: Spinning/Vertigo and Unsteady with head/body turns  Frequency: daily  Duration: minutes to hours  Aggravating factors: Induced by position change: lying supine, rolling to the right, rolling to the left, supine to sit, and sit to stand, Induced by motion: occur when walking, looking up at the ceiling, bending down to the ground, turning body quickly, and turning head quickly, and Worse in the dark  Relieving factors: no known relieving  factors  Progression of symptoms: unchanged  OCULOMOTOR EXAM:  Ocular Alignment: normal  Ocular ROM: No Limitations--seems to have excessive left lateral gaze and vertical ROM  Spontaneous Nystagmus: absent  Gaze-Induced Nystagmus: absent  Smooth Pursuits: intact  Saccades: hypometric/undershoots  Convergence/Divergence: 3 cm     VESTIBULAR - OCULAR REFLEX:   Slow VOR: Comment: horizontal--symptomatic/nauseated, vertical--symptomatic/nauseated--difficulty coordinating both movements  VOR Cancellation: Normal  Head-Impulse Test: Normal  Dynamic Visual Acuity: NT   POSITIONAL TESTING: NT due to time constraints  MOTION SENSITIVITY:  Motion Sensitivity Quotient Intensity: 0 = none, 1 = Lightheaded, 2 = Mild, 3 = Moderate, 4 = Severe, 5 = Vomiting  Intensity  1. Sitting to supine   2. Supine to L side   3. Supine to R side   4. Supine to sitting   5. L Hallpike-Dix   6. Up from L    7. R Hallpike-Dix    8. Up from R    9. Sitting, head tipped to L knee   10. Head up from L knee   11. Sitting, head tipped to R knee   12. Head up from R knee   13. Sitting head turns x5   14.Sitting head nods x5   15. In stance, 180 turn to L    16. In stance, 180 turn to R     OTHOSTATICS: not done                                                                                                                               TREATMENT DATE: 01/02/24   GOALS: Goals reviewed with patient? Yes  SHORT TERM GOALS: Target date: same as LTG    LONG TERM GOALS: Target date: 02/14/2024>03/19/2024    The patient will be independent with HEP for gaze adaptation, habituation, balance, and general mobility. Baseline:  Goal status: IN PROGRESS  2.  Report improved symptoms for improved quality of life per score 38 DHI Baseline: 56; 70 Goal status: IN PROGRESS 02/13/24  3.  Demo improved balance and BLE strength completing 5xSTS test in 20 sec Baseline: one rep; 24 sec Goal status: IN PROGRESS 02/13/24  4.  Demo improved safety with ambulation and reduce risk for falls per score 17/24 Dynamic Gait Index Baseline: 8/24; 14/24 Goal status: IN PROGRESS 02/13/24  5.  Improve postural stability for safety with ADL per mild-moderate sway x 30 sec condition 2 M-CTSIB Baseline: unable; DNT due to time Goal status: IN PROGRESS 02/13/24    ASSESSMENT:  CLINICAL IMPRESSION: Pt presents today ***. Skilled PT session focused on ***. Pt needs ***. Pt will continue to benefit from skilled PT towards goals for improved functional mobility and decreased fall risk.    Progress note/POC performance and review.  Reports ongoing symptoms and repeat to Dizziness Handicap Inventory score of 70/100 which would manifest increased symptoms from his baseline 56/100.  Despite self-report of increased symptom/deficits performance for 5xSTS test improved  requiring 24 sec to complete vs outset he was only able to perform a single  repetition of sit to stand due to symptoms.  Dynamic Gait Index score improved from baseline 8/24 to 14/24 and is observed to ambulate level surfaces at a brisk pace of 3.79 ft/sec w/ cane; however, demonstrates difficulty/unsteadiness with negotiating turns.  Continued sessions would ben beneficial to progress POC details to reduce symptoms and reduce risk for falls.  OBJECTIVE IMPAIRMENTS: Abnormal gait, decreased activity tolerance, decreased balance, difficulty walking, decreased strength, dizziness, and impaired vision/preception.   ACTIVITY LIMITATIONS: carrying, bending, sleeping, transfers, bed mobility, reach over head, and locomotion level  PARTICIPATION LIMITATIONS: meal prep, cleaning, laundry, interpersonal relationship, and community activity  PERSONAL FACTORS: Age, Time since onset of injury/illness/exacerbation, and 3+ comorbidities: PMH are also affecting patient's functional outcome.   REHAB POTENTIAL: Good  CLINICAL DECISION MAKING: Evolving/moderate complexity  EVALUATION COMPLEXITY: Moderate   PLAN:  PT FREQUENCY: 1x/week  PT DURATION: 4 weeks  PLANNED INTERVENTIONS: 97750- Physical Performance Testing, 97110-Therapeutic exercises, 97530- Therapeutic activity, [id number]- Neuromuscular re-education, 97535- Self Care, 02859- Manual therapy, [id number]- Gait training, and [phone]- Canalith repositioning  PLAN FOR NEXT SESSION:  ***M-CTSIB, additional habituation?   Greig Anon, PT 03/19/24 12:50 PM Phone: [phone] Fax: [phone]  [facility] at [facility] [address] [place], [address] [place], [place] [address] Phone # [phone] Fax # [phone]

## 2024-03-28 ENCOUNTER — Telehealth: Payer: Self-pay | Admitting: Pharmacist

## 2024-03-28 DIAGNOSIS — I69359 Hemiplegia and hemiparesis following cerebral infarction affecting unspecified side: Secondary | ICD-10-CM

## 2024-03-28 DIAGNOSIS — Z951 Presence of aortocoronary bypass graft: Secondary | ICD-10-CM

## 2024-03-28 MED ORDER — WEGOVY 0.25 MG/0.5ML ~~LOC~~ SOAJ: 0.2500 mg | SUBCUTANEOUS | 0 refills | Status: AC

## 2024-03-28 NOTE — Telephone Encounter (Signed)
 Patient called back.  He states he did not get the last refill on Wegovy .  He states he does want to take it.  I will send in another refill and follow-up in a few weeks.

## 2024-03-28 NOTE — Telephone Encounter (Signed)
 Left message for patient to call back.  Calling to follow-up on tolerability of Wegovy  and see if he wants to increase the dose.

## 2024-05-01 ENCOUNTER — Telehealth: Payer: Self-pay | Admitting: Pharmacist

## 2024-05-01 NOTE — Telephone Encounter (Signed)
 Patient called yesterday and left a voicemail on machine for me to call him back.  Called patient back no answer left voicemail.

## 2024-05-09 ENCOUNTER — Encounter: Payer: Self-pay | Admitting: Cardiology

## 2024-05-22 ENCOUNTER — Other Ambulatory Visit (HOSPITAL_COMMUNITY): Payer: Self-pay

## 2024-05-22 ENCOUNTER — Telehealth: Payer: Self-pay | Admitting: Pharmacy Technician

## 2024-05-22 NOTE — Telephone Encounter (Signed)
 Pharmacy Patient Advocate Encounter   Received notification from CoverMyMeds that prior authorization for wegovy  is required/requested.   Insurance verification completed.   The patient is insured through [place] [employer].   Per test claim: PA required; PA submitted to above mentioned insurance via Latent Key/confirmation #/EOC [id number] Status is pending

## 2024-05-23 NOTE — Telephone Encounter (Signed)
 Sent over chart notes providing supporting greater than 40 BMI

## 2024-05-27 ENCOUNTER — Other Ambulatory Visit (HOSPITAL_COMMUNITY): Payer: Self-pay

## 2024-06-02 ENCOUNTER — Other Ambulatory Visit (HOSPITAL_COMMUNITY): Payer: Self-pay

## 2024-06-03 ENCOUNTER — Other Ambulatory Visit (HOSPITAL_COMMUNITY): Payer: Self-pay

## 2024-06-03 NOTE — Telephone Encounter (Signed)
 Pharmacy Patient Advocate Encounter  Received notification from [employer] that Prior Authorization for Wegovy  has been APPROVED from 05/22/24 to 12/01/24. Ran test claim, Copay is $4.00- one month. This test claim was processed through [facility]- copay amounts may vary at other pharmacies due to pharmacy/plan contracts, or as the patient moves through the different stages of their insurance plan.   PA #/Case ID/Reference #: [id number]

## 2024-06-04 ENCOUNTER — Emergency Department (HOSPITAL_BASED_OUTPATIENT_CLINIC_OR_DEPARTMENT_OTHER): Payer: MEDICAID | Admitting: Radiology

## 2024-06-04 ENCOUNTER — Other Ambulatory Visit: Payer: Self-pay

## 2024-06-04 ENCOUNTER — Emergency Department (HOSPITAL_BASED_OUTPATIENT_CLINIC_OR_DEPARTMENT_OTHER): Payer: MEDICAID

## 2024-06-04 ENCOUNTER — Encounter (HOSPITAL_BASED_OUTPATIENT_CLINIC_OR_DEPARTMENT_OTHER): Payer: Self-pay | Admitting: Emergency Medicine

## 2024-06-04 ENCOUNTER — Emergency Department (HOSPITAL_BASED_OUTPATIENT_CLINIC_OR_DEPARTMENT_OTHER)
Admission: EM | Admit: 2024-06-04 | Discharge: 2024-06-04 | Disposition: A | Discharge status: Home / Self Care | Payer: MEDICAID

## 2024-06-04 DIAGNOSIS — Z7952 Long term (current) use of systemic steroids: Secondary | ICD-10-CM | POA: Insufficient documentation

## 2024-06-04 DIAGNOSIS — Z7982 Long term (current) use of aspirin: Secondary | ICD-10-CM | POA: Diagnosis not present

## 2024-06-04 DIAGNOSIS — I130 Hypertensive heart and chronic kidney disease with heart failure and stage 1 through stage 4 chronic kidney disease, or unspecified chronic kidney disease: Secondary | ICD-10-CM | POA: Diagnosis not present

## 2024-06-04 DIAGNOSIS — S161XXA Strain of muscle, fascia and tendon at neck level, initial encounter: Secondary | ICD-10-CM | POA: Insufficient documentation

## 2024-06-04 DIAGNOSIS — M25511 Pain in right shoulder: Secondary | ICD-10-CM | POA: Insufficient documentation

## 2024-06-04 DIAGNOSIS — W1839XA Other fall on same level, initial encounter: Secondary | ICD-10-CM | POA: Insufficient documentation

## 2024-06-04 DIAGNOSIS — N189 Chronic kidney disease, unspecified: Secondary | ICD-10-CM | POA: Insufficient documentation

## 2024-06-04 DIAGNOSIS — J45901 Unspecified asthma with (acute) exacerbation: Secondary | ICD-10-CM | POA: Insufficient documentation

## 2024-06-04 DIAGNOSIS — I509 Heart failure, unspecified: Secondary | ICD-10-CM | POA: Diagnosis not present

## 2024-06-04 DIAGNOSIS — S199XXA Unspecified injury of neck, initial encounter: Secondary | ICD-10-CM | POA: Diagnosis present

## 2024-06-04 MED ORDER — METHOCARBAMOL 500 MG PO TABS: 500.0000 mg | ORAL_TABLET | Freq: Two times a day (BID) | ORAL | 0 refills | Status: AC

## 2024-06-04 MED ORDER — ALBUTEROL SULFATE HFA 108 (90 BASE) MCG/ACT IN AERS: 2.0000 | INHALATION_SPRAY | RESPIRATORY_TRACT | 0 refills | Status: AC | PRN

## 2024-06-04 MED ORDER — LIDOCAINE 5 % EX PTCH
1.0000 | MEDICATED_PATCH | CUTANEOUS | Status: DC
  Administered 2024-06-04: 1 via TRANSDERMAL
  Filled 2024-06-04: qty 1

## 2024-06-04 MED ORDER — PREDNISONE 50 MG PO TABS
60.0000 mg | ORAL_TABLET | Freq: Once | ORAL | Status: AC
  Administered 2024-06-04: 60 mg via ORAL
  Filled 2024-06-04: qty 1

## 2024-06-04 MED ORDER — DICLOFENAC SODIUM 1 % EX GEL
4.0000 g | Freq: Once | CUTANEOUS | Status: AC
  Administered 2024-06-04: 4 g via TOPICAL
  Filled 2024-06-04: qty 100

## 2024-06-04 MED ORDER — IPRATROPIUM-ALBUTEROL 0.5-2.5 (3) MG/3ML IN SOLN
3.0000 mL | Freq: Once | RESPIRATORY_TRACT | Status: AC
  Administered 2024-06-04: 3 mL via RESPIRATORY_TRACT
  Filled 2024-06-04: qty 3

## 2024-06-04 MED ORDER — PREDNISONE 50 MG PO TABS: ORAL_TABLET | ORAL | 0 refills | Status: AC

## 2024-06-04 MED ORDER — LIDOCAINE 5 % EX PTCH: 1.0000 | MEDICATED_PATCH | CUTANEOUS | 0 refills | Status: AC

## 2024-06-04 MED ORDER — DICLOFENAC SODIUM 1 % EX GEL: 4.0000 g | Freq: Four times a day (QID) | CUTANEOUS | 0 refills | Status: AC

## 2024-06-04 NOTE — Discharge Instructions (Addendum)
 Your workup today was reassuring.  Your x-rays do show some degenerative changes in your neck which I think are probably causing the symptoms.  You did have some wheezing on exam here when you arrived today.  Your x-ray was clear.  Please take the steroids which should help with the neck pain as well as the wheezing.  Use 2 puffs of the albuterol  every 4 hours over the next few days.  Try the Voltaren gel and lidocaine  patches to see if this helps with your symptoms.  He may also take the methocarbamol.  This may make you drowsy so do not drive or drink alcohol while taking this.  Please follow-up with your doctor and return to the ER for worsening symptoms.

## 2024-06-04 NOTE — ED Notes (Signed)
 Contacted [employer] for taxi voucher for pt.

## 2024-06-04 NOTE — ED Provider Notes (Signed)
 [place] EMERGENCY DEPARTMENT AT [facility] Provider Note   CSN: [id number] Arrival date & time: 06/04/24  1556     Patient presents with: Shoulder Pain   John Price is a 53 y.o. male.   53 year old male with past medical history of CHF, chronic kidney disease, hypertension, and asthma presenting to the emergency department today with pain in his right neck and right shoulder.  The patient states that this been going now for the past day or 2.  Reports he woke up yesterday after falling asleep sitting up and he had his neck off to the side when he woke up.  He states that he was having pain since then.  He states that pain is worse with movement.  Denies any associated chest pain with this.  States that he does have a history of asthma and has had some wheezing recently as well.  Denies any associated chest pain.  He denies any focal weakness, numbness, or tingling although he does have history of left-sided deficits from prior stroke although these are stable.  He denies any recent injuries.  He came to the ER today for further evaluation regarding this due to the symptoms.  He is unable to tolerate opiates and cannot take NSAIDs due to his renal dysfunction.  He denies any weakness, bowel or bladder dysfunction, or saddle anesthesia.   Shoulder Pain Associated symptoms: neck pain        Prior to Admission medications   Medication Sig Start Date End Date Taking? Authorizing Provider  albuterol  (VENTOLIN  HFA) 108 (90 Base) MCG/ACT inhaler Inhale 2 puffs into the lungs every 4 (four) hours as needed for wheezing or shortness of breath. 06/04/24  Yes Ula Prentice SAUNDERS, MD  diclofenac Sodium (VOLTAREN) 1 % GEL Apply 4 g topically 4 (four) times daily. 06/04/24  Yes Ula Prentice SAUNDERS, MD  lidocaine  (LIDODERM ) 5 % Place 1 patch onto the skin daily. Remove & Discard patch within 12 hours or as directed by MD 06/04/24  Yes Ula Prentice SAUNDERS, MD  methocarbamol (ROBAXIN) 500 MG tablet Take 1  tablet (500 mg total) by mouth 2 (two) times daily. 06/04/24  Yes Ula Prentice SAUNDERS, MD  predniSONE  (DELTASONE ) 50 MG tablet Take 1 tablet by mouth daily 06/04/24  Yes Ula Prentice SAUNDERS, MD  albuterol  (VENTOLIN  HFA) 108 (90 Base) MCG/ACT inhaler 2 puffs every 6 hours as needed for wheezing 09/19/23   Jeneal Danita Macintosh, MD  allopurinol  (ZYLOPRIM ) 100 MG tablet Take 100 mg by mouth daily. Patient did not know the dose    [provider]  aspirin  81 MG chewable tablet 1 tab by mouth daily with morning meal 07/10/23   Tolia, Sunit, DO  atorvastatin  (LIPITOR) 40 MG tablet 1 tab by mouth with evening meal. 07/10/23   Tolia, Sunit, DO  Budeson-Glycopyrrol-Formoterol  (BREZTRI  AEROSPHERE) 160-9-4.8 MCG/ACT AERO Inhale 2 puffs into the lungs 2 (two) times daily. 09/19/23   Jeneal Danita Macintosh, MD  carvedilol  (COREG ) 25 MG tablet TAKE 1 TABLET BY MOUTH 2 TIMES DAILY WITH MEALS 07/10/23   Tolia, Sunit, DO  cetirizine  (ZYRTEC ) 10 MG tablet Take 1 tablet (10 mg total) by mouth daily. 09/19/23   Jeneal Danita Macintosh, MD  empagliflozin  (JARDIANCE ) 10 MG TABS tablet Take 1 tablet (10 mg total) by mouth daily. 07/10/23   Tolia, Sunit, DO  famotidine  (PEPCID ) 40 MG tablet Take 40 mg by mouth at bedtime. 12/06/21   [provider]  furosemide  (LASIX ) 40 MG tablet 1  tab by mouth with morning meal 07/10/23   Tolia, Sunit, DO  hydrOXYzine (ATARAX) 50 MG tablet Take 50 mg by mouth in the morning and at bedtime.    [provider]  ipratropium (ATROVENT ) 0.06 % nasal spray Use 1-2 sprays per nostril 3-4 times daily as needed 09/19/23   Jeneal Danita Macintosh, MD  isosorbide  mononitrate (IMDUR ) 60 MG 24 hr tablet Take 1 tablet (60 mg total) by mouth daily. 07/10/23   Tolia, Sunit, DO  meclizine  (ANTIVERT ) 25 MG tablet Take 25 mg by mouth 3 (three) times daily as needed for dizziness.    [provider]  omeprazole (PRILOSEC) 40 MG capsule Take 40 mg by mouth daily.    [provider]  potassium chloride  SA (KLOR-CON  M) 20 MEQ tablet Take 1 tablet (20 mEq total) by mouth daily. 07/10/23   Tolia, Sunit, DO  sacubitril -valsartan  (ENTRESTO ) 49-51 MG Take 1 tablet by mouth 2 (two) times daily. 07/10/23   Tolia, Sunit, DO  semaglutide -weight management (WEGOVY ) 0.25 MG/0.5ML SOAJ SQ injection Inject 0.25 mg into the skin once a week. 03/28/24   Tolia, Sunit, DO  sucralfate  (CARAFATE ) 1 g tablet Take 1 tablet (1 g total) by mouth 2 (two) times daily for 14 days. Patient taking differently: Take 1 g by mouth at bedtime. 02/02/23 05/14/23  Ruthe Cornet, DO    Allergies: Gabapentin and Other    Review of Systems  Musculoskeletal:  Positive for arthralgias and neck pain.    Updated Vital Signs BP (!) 160/109 (BP Location: Left Arm)   Pulse 76   Temp 98.3 F (36.8 C) (Oral)   Resp 18   SpO2 96%   Physical Exam Vitals and nursing note reviewed.   Gen: NAD Eyes: PERRL, EOMI HEENT: no oropharyngeal swelling Neck: trachea midline, no midline tenderness, patient does have right paraspinal tenderness and tenderness over the trapezius with muscle spasm noted Resp: clear to auscultation bilaterally Card: RRR, no murmurs, rubs, or gallops Abd: nontender, nondistended Extremities: no calf tenderness, no edema MSK: The patient is tender over the proximal humerus as well as over the scapula on the right with normal range of motion actively and passively Neuro: Diminished strength and sensation to the left upper lower extremity consistent with previous stroke with normal strength and sensation through the right upper and lower extremity, cranial nerves intact Vascular: 2+ radial pulses bilaterally, 2+ DP pulses bilaterally Skin: no rashes Psyc: acting appropriately   (all labs ordered are listed, but only abnormal results are displayed) Labs Reviewed - No data to display  EKG: None  Radiology: DG Shoulder Right Result Date: 06/04/2024 EXAM: 1 VIEW XRAY OF THE  RIGHT SHOULDER 06/04/2024 05:57:00 PM COMPARISON: None available. CLINICAL HISTORY: Wheezing. FINDINGS: BONES AND JOINTS: Glenohumeral joint is normally aligned. No acute fracture or dislocation. Mild degenerative changes in the right [facility] joint with spurring. SOFT TISSUES: No abnormal calcifications. Visualized lung is unremarkable. IMPRESSION: 1. No acute abnormality of the right shoulder. Electronically signed by: Franky Crease MD 06/04/2024 06:29 PM EST RP Workstation: [id number]   DG Chest 1 View Result Date: 06/04/2024 EXAM: 1 VIEW(S) XRAY OF THE CHEST 06/04/2024 06:00:01 PM COMPARISON: 02/02/2023 CLINICAL HISTORY: Wheezing. FINDINGS: LUNGS AND PLEURA: No focal pulmonary opacity. No pulmonary edema. No pleural effusion. No pneumothorax. HEART AND MEDIASTINUM: Prior CABG. No acute abnormality of the cardiac and mediastinal silhouettes. BONES AND SOFT TISSUES: No acute osseous abnormality. IMPRESSION: 1. No acute cardiopulmonary process. Electronically signed by: Franky Crease MD 06/04/2024 06:28  PM EST RP Workstation: [id number]   DG Cervical Spine Complete Result Date: 06/04/2024 EXAM: 6 OR MORE VIEW(S) XRAY OF THE CERVICAL SPINE 06/04/2024 05:57:00 PM COMPARISON: None available. CLINICAL HISTORY: Wheezing. FINDINGS: BONES: No acute fracture. No aggressive appearing osseous lesion. Alignment is normal. DISCS AND DEGENERATIVE CHANGES: Degenerative disc disease with anterior spurring. Mild to moderate bilateral degenerative facet disease. Mild right neuroforaminal narrowing at C2-C3 and C5-C6. SOFT TISSUES: No prevertebral soft tissue swelling. The visualized lungs appear clear. IMPRESSION: 1. Degenerative disc and facet disease with mild right neuroforaminal narrowing at C2-3 and C5-6. 2. No acute bony abnormality. Electronically signed by: Franky Crease MD 06/04/2024 06:27 PM EST RP Workstation: [id number]     Procedures   Medications Ordered in the ED  lidocaine  (LIDODERM ) 5 % 1 patch (1 patch  Transdermal Patch Applied 06/04/24 1800)  ipratropium-albuterol  (DUONEB) 0.5-2.5 (3) MG/3ML nebulizer solution 3 mL (3 mLs Nebulization Given 06/04/24 1751)  predniSONE  (DELTASONE ) tablet 60 mg (60 mg Oral Given 06/04/24 1800)  diclofenac Sodium (VOLTAREN) 1 % topical gel 4 g (4 g Topical Given 06/04/24 1801)                                    Medical Decision Making 53 year old male with past medical history of CVA with left-sided deficits as well as history of asthma, CKD, and hypertension presenting to the emergency department today with pain in his right neck and shoulder.  Will further evaluate the patient here with x-rays to evaluate for bony abnormalities given his age and comorbidities.  Also obtain a chest x-ray as patient is wheezing here on exam.  Will obtain an EKG to evaluate for atypical cardiac presentation although suspicion for this is low and this is reproducible here on exam.  Will give the patient prednisone  here as well as a DuoNeb here.  Will also try some topical lidocaine  and Voltaren gel due to the patient's comorbidities and intolerances to medications.  Based on reproducibility of his symptoms here on exam and reassuring neurovascular exam at his baseline suspicion for aortic dissection is low at this time.  The patient's x-rays are reassuring.  There are some degenerative changes on the cervical spine but the shoulder x-ray is unremarkable.  Patient's pulse ox is 98% on reassessment.  He is feeling better on reassessment and will be discharged with return precautions.  Amount and/or Complexity of Data Reviewed Radiology: ordered.  Risk Prescription drug management.        Final diagnoses:  Strain of neck muscle, initial encounter  Exacerbation of asthma, unspecified asthma severity, unspecified whether persistent    ED Discharge Orders          Ordered    predniSONE  (DELTASONE ) 50 MG tablet        06/04/24 1913    albuterol  (VENTOLIN  HFA) 108 (90 Base)  MCG/ACT inhaler  Every 4 hours PRN        06/04/24 1913    diclofenac Sodium (VOLTAREN) 1 % GEL  4 times daily        06/04/24 1913    lidocaine  (LIDODERM ) 5 %  Every 24 hours        06/04/24 1913    methocarbamol (ROBAXIN) 500 MG tablet  2 times daily        06/04/24 1913               Ula Prentice SAUNDERS, MD 06/04/24 [phone]

## 2024-06-04 NOTE — ED Triage Notes (Signed)
 BIB [facility] from home. Reports bilateral shoulder and neck pain x 1 week. Reports sleeping wrong

## 2024-07-17 ENCOUNTER — Other Ambulatory Visit: Payer: Self-pay | Admitting: Cardiology

## 2024-07-17 DIAGNOSIS — I69359 Hemiplegia and hemiparesis following cerebral infarction affecting unspecified side: Secondary | ICD-10-CM

## 2024-07-17 DIAGNOSIS — I5032 Chronic diastolic (congestive) heart failure: Secondary | ICD-10-CM

## 2024-07-17 DIAGNOSIS — Z951 Presence of aortocoronary bypass graft: Secondary | ICD-10-CM

## 2024-08-14 ENCOUNTER — Other Ambulatory Visit: Payer: Self-pay | Admitting: Nephrology

## 2024-08-14 DIAGNOSIS — N281 Cyst of kidney, acquired: Secondary | ICD-10-CM

## 2024-08-14 DIAGNOSIS — N1832 Chronic kidney disease, stage 3b: Secondary | ICD-10-CM

## 2024-08-21 ENCOUNTER — Ambulatory Visit
Admission: RE | Admit: 2024-08-21 | Discharge: 2024-08-21 | Disposition: A | Discharge status: Home / Self Care | Payer: MEDICAID | Source: Ambulatory Visit | Attending: Nephrology | Admitting: Nephrology

## 2024-08-21 DIAGNOSIS — N1832 Chronic kidney disease, stage 3b: Secondary | ICD-10-CM

## 2024-08-21 DIAGNOSIS — N281 Cyst of kidney, acquired: Secondary | ICD-10-CM

## 2024-09-22 ENCOUNTER — Ambulatory Visit: Payer: MEDICAID | Admitting: Cardiology
